# Patient Record
Sex: Male | Born: 1945
Health system: Southern US, Community
[De-identification: ages and names within clinical notes are randomized; demographics above are authoritative.]

## PROBLEM LIST (undated history)

## (undated) DIAGNOSIS — K219 Gastro-esophageal reflux disease without esophagitis: Secondary | ICD-10-CM

## (undated) DIAGNOSIS — M545 Low back pain: Secondary | ICD-10-CM

## (undated) DIAGNOSIS — N4340 Spermatocele of epididymis, unspecified: Secondary | ICD-10-CM

## (undated) DIAGNOSIS — C4490 Unspecified malignant neoplasm of skin, unspecified: Secondary | ICD-10-CM

## (undated) DIAGNOSIS — R42 Dizziness and giddiness: Secondary | ICD-10-CM

## (undated) DIAGNOSIS — R079 Chest pain, unspecified: Secondary | ICD-10-CM

## (undated) DIAGNOSIS — R011 Cardiac murmur, unspecified: Secondary | ICD-10-CM

## (undated) DIAGNOSIS — I10 Essential (primary) hypertension: Secondary | ICD-10-CM

## (undated) DIAGNOSIS — M542 Cervicalgia: Secondary | ICD-10-CM

## (undated) DIAGNOSIS — N3010 Interstitial cystitis (chronic) without hematuria: Secondary | ICD-10-CM

## (undated) DIAGNOSIS — M1990 Unspecified osteoarthritis, unspecified site: Secondary | ICD-10-CM

## (undated) DIAGNOSIS — E785 Hyperlipidemia, unspecified: Secondary | ICD-10-CM

## (undated) DIAGNOSIS — M519 Unspecified thoracic, thoracolumbar and lumbosacral intervertebral disc disorder: Secondary | ICD-10-CM

## (undated) DIAGNOSIS — I471 Supraventricular tachycardia: Secondary | ICD-10-CM

## (undated) DIAGNOSIS — K5900 Constipation, unspecified: Secondary | ICD-10-CM

## (undated) DIAGNOSIS — J309 Allergic rhinitis, unspecified: Secondary | ICD-10-CM

## (undated) DIAGNOSIS — N400 Enlarged prostate without lower urinary tract symptoms: Secondary | ICD-10-CM

## (undated) HISTORY — PX: TONSILLECTOMY: SUR1361

## (undated) HISTORY — DX: Chest pain, unspecified: R07.9

## (undated) HISTORY — DX: Constipation, unspecified: K59.00

## (undated) HISTORY — DX: Essential (primary) hypertension: I10

## (undated) HISTORY — PX: APPENDECTOMY: SHX54

## (undated) HISTORY — DX: Allergic rhinitis, unspecified: J30.9

## (undated) HISTORY — DX: Low back pain: M54.5

## (undated) HISTORY — PX: KNEE ARTHROSCOPY: SUR90

## (undated) HISTORY — DX: Unspecified malignant neoplasm of skin, unspecified: C44.90

## (undated) HISTORY — DX: Hyperlipidemia, unspecified: E78.5

## (undated) HISTORY — DX: Interstitial cystitis (chronic) without hematuria: N30.10

## (undated) HISTORY — DX: Unspecified thoracic, thoracolumbar and lumbosacral intervertebral disc disorder: M51.9

## (undated) HISTORY — PX: OTHER SURGICAL HISTORY: SHX169

## (undated) HISTORY — DX: Cervicalgia: M54.2

## (undated) HISTORY — DX: Dizziness and giddiness: R42

## (undated) HISTORY — DX: Supraventricular tachycardia: I47.1

---

## 2003-07-07 ENCOUNTER — Encounter: Admission: RE | Admit: 2003-07-07 | Discharge: 2003-07-07 | Payer: Self-pay | Admitting: Internal Medicine

## 2004-03-19 ENCOUNTER — Ambulatory Visit: Payer: Self-pay | Admitting: Internal Medicine

## 2004-08-06 ENCOUNTER — Ambulatory Visit: Payer: Self-pay | Admitting: Internal Medicine

## 2005-04-01 ENCOUNTER — Ambulatory Visit: Payer: Self-pay | Admitting: Internal Medicine

## 2005-10-07 ENCOUNTER — Ambulatory Visit: Payer: Self-pay | Admitting: Internal Medicine

## 2006-11-25 ENCOUNTER — Ambulatory Visit: Payer: Self-pay | Admitting: Internal Medicine

## 2006-12-06 ENCOUNTER — Ambulatory Visit: Payer: Self-pay | Admitting: Internal Medicine

## 2006-12-06 LAB — CONVERTED CEMR LAB
AST: 22 units/L (ref 0–37)
Albumin: 4 g/dL (ref 3.5–5.2)
Alkaline Phosphatase: 59 units/L (ref 39–117)
Basophils Relative: 0.5 % (ref 0.0–1.0)
Bilirubin Urine: NEGATIVE
CO2: 28 meq/L (ref 19–32)
Chloride: 108 meq/L (ref 96–112)
Cholesterol: 181 mg/dL (ref 0–200)
Creatinine, Ser: 0.9 mg/dL (ref 0.4–1.5)
Eosinophils Absolute: 0.2 10*3/uL (ref 0.0–0.6)
Eosinophils Relative: 3.8 % (ref 0.0–5.0)
HCT: 43.3 % (ref 39.0–52.0)
Leukocytes, UA: NEGATIVE
Lymphocytes Relative: 24.7 % (ref 12.0–46.0)
MCV: 87.5 fL (ref 78.0–100.0)
Monocytes Relative: 7.5 % (ref 3.0–11.0)
Nitrite: NEGATIVE
PSA: 1.45 ng/mL (ref 0.10–4.00)
Platelets: 222 10*3/uL (ref 150–400)
Potassium: 4.3 meq/L (ref 3.5–5.1)
RBC: 4.95 M/uL (ref 4.22–5.81)
RDW: 12.1 % (ref 11.5–14.6)
Specific Gravity, Urine: 1.015 (ref 1.000–1.03)
Total Bilirubin: 0.7 mg/dL (ref 0.3–1.2)
Urine Glucose: NEGATIVE mg/dL
Urobilinogen, UA: 0.2 (ref 0.0–1.0)
VLDL: 22 mg/dL (ref 0–40)

## 2006-12-21 ENCOUNTER — Ambulatory Visit: Payer: Self-pay | Admitting: Gastroenterology

## 2007-01-10 ENCOUNTER — Ambulatory Visit: Payer: Self-pay | Admitting: Gastroenterology

## 2007-05-04 ENCOUNTER — Ambulatory Visit: Payer: Self-pay | Admitting: Internal Medicine

## 2007-05-04 DIAGNOSIS — I471 Supraventricular tachycardia, unspecified: Secondary | ICD-10-CM

## 2007-05-04 DIAGNOSIS — E785 Hyperlipidemia, unspecified: Secondary | ICD-10-CM | POA: Insufficient documentation

## 2007-05-04 DIAGNOSIS — I10 Essential (primary) hypertension: Secondary | ICD-10-CM | POA: Insufficient documentation

## 2007-05-04 DIAGNOSIS — J309 Allergic rhinitis, unspecified: Secondary | ICD-10-CM

## 2007-05-04 DIAGNOSIS — R42 Dizziness and giddiness: Secondary | ICD-10-CM

## 2007-05-04 HISTORY — DX: Supraventricular tachycardia, unspecified: I47.10

## 2007-05-04 HISTORY — DX: Supraventricular tachycardia: I47.1

## 2007-05-04 HISTORY — DX: Allergic rhinitis, unspecified: J30.9

## 2007-05-04 HISTORY — DX: Essential (primary) hypertension: I10

## 2007-05-04 HISTORY — DX: Hyperlipidemia, unspecified: E78.5

## 2007-05-04 HISTORY — DX: Dizziness and giddiness: R42

## 2008-01-11 ENCOUNTER — Ambulatory Visit: Payer: Self-pay | Admitting: Gastroenterology

## 2008-01-25 ENCOUNTER — Ambulatory Visit: Payer: Self-pay | Admitting: Gastroenterology

## 2008-03-04 HISTORY — PX: OTHER SURGICAL HISTORY: SHX169

## 2008-04-10 ENCOUNTER — Ambulatory Visit: Payer: Self-pay | Admitting: Internal Medicine

## 2008-06-26 ENCOUNTER — Ambulatory Visit: Payer: Self-pay | Admitting: Internal Medicine

## 2008-07-03 ENCOUNTER — Encounter: Payer: Self-pay | Admitting: Internal Medicine

## 2008-07-03 ENCOUNTER — Ambulatory Visit: Payer: Self-pay

## 2008-07-04 ENCOUNTER — Telehealth: Payer: Self-pay | Admitting: Internal Medicine

## 2008-07-12 ENCOUNTER — Encounter: Payer: Self-pay | Admitting: Internal Medicine

## 2008-09-04 ENCOUNTER — Encounter: Admission: RE | Admit: 2008-09-04 | Discharge: 2008-09-04 | Payer: Self-pay | Admitting: Orthopedic Surgery

## 2008-09-06 ENCOUNTER — Ambulatory Visit: Payer: Self-pay | Admitting: Internal Medicine

## 2008-09-14 ENCOUNTER — Encounter: Payer: Self-pay | Admitting: Internal Medicine

## 2009-03-08 ENCOUNTER — Ambulatory Visit: Payer: Self-pay | Admitting: Internal Medicine

## 2009-06-06 ENCOUNTER — Ambulatory Visit: Payer: Self-pay | Admitting: Internal Medicine

## 2009-06-07 LAB — CONVERTED CEMR LAB
ALT: 32 units/L (ref 0–53)
AST: 25 units/L (ref 0–37)
Albumin: 4.4 g/dL (ref 3.5–5.2)
Alkaline Phosphatase: 54 units/L (ref 39–117)
Basophils Absolute: 0 10*3/uL (ref 0.0–0.1)
Basophils Relative: 0.6 % (ref 0.0–3.0)
Bilirubin Urine: NEGATIVE
Bilirubin, Direct: 0.1 mg/dL (ref 0.0–0.3)
CO2: 30 meq/L (ref 19–32)
Creatinine, Ser: 0.9 mg/dL (ref 0.4–1.5)
Eosinophils Absolute: 0.4 10*3/uL (ref 0.0–0.7)
Folate: >20 ng/mL
GFR calc non Af Amer: 90 mL/min (ref >60)
HCT: 42.8 % (ref 39.0–52.0)
Hemoglobin, Urine: NEGATIVE
Hemoglobin: 14.4 g/dL (ref 13.0–17.0)
MCV: 89.8 fL (ref 78.0–100.0)
Neutrophils Relative %: 49.8 % (ref 43.0–77.0)
Nitrite: NEGATIVE
Platelets: 167 10*3/uL (ref 150.0–400.0)
RBC: 4.76 M/uL (ref 4.22–5.81)
RDW: 11.9 % (ref 11.5–14.6)
Sodium: 141 meq/L (ref 135–145)
Specific Gravity, Urine: 1.01 (ref 1.000–1.030)
Total CHOL/HDL Ratio: 3
Total Protein, Urine: NEGATIVE mg/dL
Total Protein: 7.8 g/dL (ref 6.0–8.3)
Transferrin: 278 mg/dL (ref 212.0–360.0)
Triglycerides: 156 mg/dL — ABNORMAL HIGH (ref 0.0–149.0)
VLDL: 31.2 mg/dL (ref 0.0–40.0)
WBC: 6.4 10*3/uL (ref 4.5–10.5)
pH: 7 (ref 5.0–8.0)

## 2009-07-15 ENCOUNTER — Ambulatory Visit: Payer: Self-pay | Admitting: Internal Medicine

## 2009-07-15 ENCOUNTER — Encounter: Payer: Self-pay | Admitting: Internal Medicine

## 2009-07-15 DIAGNOSIS — R079 Chest pain, unspecified: Secondary | ICD-10-CM

## 2009-07-15 DIAGNOSIS — M542 Cervicalgia: Secondary | ICD-10-CM

## 2009-07-15 HISTORY — DX: Chest pain, unspecified: R07.9

## 2009-07-15 HISTORY — DX: Cervicalgia: M54.2

## 2009-07-17 ENCOUNTER — Telehealth: Payer: Self-pay | Admitting: Internal Medicine

## 2010-02-17 ENCOUNTER — Ambulatory Visit: Payer: Self-pay | Admitting: Internal Medicine

## 2010-02-17 DIAGNOSIS — M545 Low back pain, unspecified: Secondary | ICD-10-CM

## 2010-02-17 DIAGNOSIS — K5900 Constipation, unspecified: Secondary | ICD-10-CM

## 2010-02-17 HISTORY — DX: Constipation, unspecified: K59.00

## 2010-02-17 HISTORY — DX: Low back pain, unspecified: M54.50

## 2010-06-03 NOTE — Progress Notes (Signed)
Summary: RX/results  Phone Note Call from Patient Call back at Home Phone [phone]   Summary of Call: Patient left message on triage that he was supposed to receive prescription for a pain pill and a muscle relaxer, when patient went to his pharmacy they had not rec'd prescription for pain med. Patient wanted to double check this and he also would like test results. Please advise. Initial call taken by: Lucious Groves,  July 17, 2009 3:40 PM  Follow-up for Phone Call        patient is referred back to his instructions on the tylenol and advil for pain;  the robaxin for muscle relaxer was sent to the pharmacy but this is not normally accepted escript;  will re-do -   done hardcopy to LIM side B - dahlia  Follow-up by: Corwin Levins MD,  July 17, 2009 5:44 PM  Additional Follow-up for Phone Call Additional follow up Details #1::        I called pt's pharmacy and was advised by Nedra Hai that RX for Robaxin was picked up by pt 03/14. I called pt back to advise of above, left message on machine for pt to call office. RX re-print discarded. Additional Follow-up by: Margaret Pyle, CMA,  July 18, 2009 10:20 AM    Prescriptions: ROBAXIN 500 MG TABS (METHOCARBAMOL) 1-2 by mouth QID as needed for muscle spasm  #50 x 1   Entered and Authorized by:   Corwin Levins MD   Signed by:   Corwin Levins MD on 07/17/2009   Method used:   Print then Give to Patient   RxID:   [id number]   Appended Document: RX/results pt called back and I explained above. pt understood and stated that it was a misunderstanding. pt als stated that he will f/u with ortho.
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
## 2010-06-03 NOTE — Assessment & Plan Note (Signed)
Summary: FU ON MEDS/ REFILLS/ FLU SHOT /NWS   Vital Signs:  Patient profile:   65 year old male Height:      74.5 inches Weight:      225.38 pounds BMI:     28.65 O2 Sat:      95 % on Room air Temp:     97.9 degrees F oral Pulse rate:   75 / minute BP sitting:   130 / 82  (left arm) Cuff size:   large  Vitals Entered By: Zella Ball Ewing CMA Duncan Dull) (February 17, 2010 1:13 PM)  O2 Flow:  Room air CC: followup on medications, flu shot/RE   Primary Care Provider:  Corwin Levins MD  CC:  followup on medications and flu shot/RE.  History of Present Illness: here for f/u - neck pain has resolved;  has ongoing chronic recurrent lower back pain, asks for muscle relaxer refill;  "if it wasnt for my teeth and my back , I would be great."  Had a lab screening at [facility] - BP 161/98; glc 98, tot chol 229, BMI 28.8;  could not afford lipitor in the past but did take some of sister in laws lipitor in feb with most recent labs; unfort no lipitor since feb - dose at that time was 20 mg;  wants to start the lipitor next mo with new rx;  Pt denies CP, worsening sob, doe, wheezing, orthopnea, pnd, worsening LE edema, palps, dizziness or syncope .  Also had cortisone to right elbow and volatern gel that has worked well in the past few wks.  Due for flu shot today.  Pt denies new neuro symptoms such as headache, facial or extremity weakness No fever, wt loss, night sweats, loss of appetite or other constitutional symptoms   Problems Prior to Update: 1)  Constipation  (ICD-564.00) 2)  Back Pain, Lumbar  (ICD-724.2) 3)  Chest Pain  (ICD-786.50) 4)  Neck Pain, Acute  (ICD-723.1) 5)  Special Screening Malig Neoplasms Other Sites  (ICD-V76.49) 6)  Preventive Health Care  (ICD-V70.0) 7)  Preventive Health Care  (ICD-V70.0) 8)  Psvt  (ICD-427.0) 9)  Vertigo  (ICD-780.4) 10)  Allergic Rhinitis  (ICD-477.9) 11)  Hypertension  (ICD-401.9) 12)  Hyperlipidemia  (ICD-272.4)  Medications Prior to Update: 1)   Atenolol 50 Mg Tabs (Atenolol) .Marland Kitchen.. 1po Once Daily 2)  Ecotrin Low Strength 81 Mg  Tbec (Aspirin) .Marland Kitchen.. 1 Poi Qd 3)  Antibiotic Per Dental For 4 Wks 4)  Meclizine Hcl 12.5 Mg Tabs (Meclizine Hcl) .Marland Kitchen.. 1 - 2 By Mouth Q 6 Hrs As Needed Dizziness 5)  Diazepam 5 Mg Tabs (Diazepam) .Marland Kitchen.. 1 By Mouth Two Times A Day As Needed Dizziness 6)  Robaxin 500 Mg Tabs (Methocarbamol) .Marland Kitchen.. 1-2 By Mouth Qid As Needed For Muscle Spasm  Current Medications (verified): 1)  Atenolol 50 Mg Tabs (Atenolol) .Marland Kitchen.. 1po Once Daily 2)  Ecotrin Low Strength 81 Mg  Tbec (Aspirin) .Marland Kitchen.. 1 Poi Qd 3)  Robaxin 500 Mg Tabs (Methocarbamol) .Marland Kitchen.. 1-2 By Mouth Qid As Needed For Muscle Spasm 4)  Voltaren 1 % Gel (Diclofenac Sodium) .... Use Asd 5)  Stool Softner .Marland KitchenMarland KitchenMarland Kitchen 1po Two Times A Day 6)  Miralax  Powd (Polyethylene Glycol 3350) .Marland Kitchen.. 1 Scoop in 8 Oz Water Per Day 7)  Tramadol Hcl 50 Mg Tabs (Tramadol Hcl) .Marland Kitchen.. 1 - 2 By Mouth Q 6 Hrs 8)  Lipitor 20 Mg Tabs (Atorvastatin Calcium) .Marland Kitchen.. 1po Once Daily  Allergies (verified): 1)  !  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   Head:  normocephalic, atraumatic, no abnormalities observed, and no abnormalities palpated.   Eyes:  vision grossly intact, pupils equal, pupils round, and pupils reactive to light.   Ears:  R ear normal and L ear normal.   Nose:  no external deformity and no nasal discharge.   Mouth:  Oral mucosa and oropharynx without lesions or exudates. Neck:  supple, full ROM, no masses, no thyromegaly, no thyroid nodules or tenderness, no JVD, normal carotid upstroke, and no cervical lymphadenopathy.   Lungs:  normal respiratory effort, no intercostal retractions, no accessory muscle use, normal breath sounds, no dullness, no fremitus, no crackles, and no wheezes.   Heart:  normal rate, regular rhythm, no murmur, no gallop, no rub, no JVD Abdomen:  soft, non-tender, normal bowel sounds, no distention, no masses, no guarding, no rigidity, no rebound tenderness, Msk:  normal ROM, no joint tenderness, no joint swelling, no joint warmth, no redness over joints; no spine tenderness Extremities:  no edema, no erythema  Neurologic:  No cranial nerve deficits noted. Station and gait are normal. Plantar reflexes are down-going bilaterally. DTRs are symmetrical throughout. Sensory, motor and coordinative functions appear intact. Skin:  color normal and no rashes.   Psych:  not depressed appearing and slightly anxious.     Impression & Recommendations:  Problem # 1:  HYPERTENSION (ICD-401.9)  His updated medication list for this problem includes:    Atenolol 50 Mg Tabs (Atenolol) .Marland Kitchen... 1po once daily  BP today: 130/82 Prior BP: 146/82 (07/15/2009)  Labs Reviewed: K+: 4.3 (06/06/2009) Creat: : 0.9 (06/06/2009)   Chol: 164 (06/06/2009)   HDL: 56.50 (06/06/2009)   LDL: 76 (06/06/2009)   TG: 156.0 (06/06/2009) stable overall by hx and exam, ok to continue meds/tx as is   Problem # 2:   BACK PAIN, LUMBAR (ICD-724.2)  His updated medication list for this problem includes:    Ecotrin Low Strength 81 Mg Tbec (Aspirin) .Marland Kitchen... 1 poi qd    Robaxin 500 Mg Tabs (Methocarbamol) .Marland Kitchen... 1-2 by mouth qid as needed for muscle spasm    Tramadol Hcl 50 Mg Tabs (Tramadol hcl) .Marland Kitchen... 1 - 2 by mouth q 6 hrs chronic, recurrent no change - treat as above, f/u any worsening signs or symptoms   Problem # 3:  NECK PAIN, ACUTE (ICD-723.1)  His updated medication list for this problem includes:    Ecotrin Low Strength 81 Mg Tbec (Aspirin) .Marland Kitchen... 1 poi qd    Robaxin 500 Mg Tabs (Methocarbamol) .Marland Kitchen... 1-2 by mouth qid as needed for muscle spasm  Tramadol Hcl 50 Mg Tabs (Tramadol hcl) .Marland Kitchen... 1 - 2 by mouth q 6 hrs resolved, f/u any worsneing s/s  Problem # 4:  HYPERLIPIDEMIA (ICD-272.4)  Labs Reviewed: SGOT: 25 (06/06/2009)   SGPT: 32 (06/06/2009)   HDL:56.50 (06/06/2009), 54.7 (12/06/2006)  LDL:76 (06/06/2009), 104 (16/02/9603)  Chol:164 (06/06/2009), 181 (12/06/2006)  Trig:156.0 (06/06/2009), 110 (12/06/2006) to re-start the lipitor 20 mg; Pt to continue diet efforts, good med tolerance previously ; to check labs next visit - goal LDL less than 100  His updated medication list for this problem includes:    Lipitor 20 Mg Tabs (Atorvastatin calcium) .Marland Kitchen... 1po once daily  Problem # 5:  CONSTIPATION (ICD-564.00)  His updated medication list for this problem includes:    Miralax Powd (Polyethylene glycol 3350) .Marland Kitchen... 1 scoop in 8 oz water per day treat as above, f/u any worsening signs or symptoms   Complete Medication List: 1)  Atenolol 50 Mg Tabs (Atenolol) .Marland Kitchen.. 1po once daily 2)  Ecotrin Low Strength 81 Mg Tbec (Aspirin) .Marland Kitchen.. 1 poi qd 3)  Robaxin 500 Mg Tabs (Methocarbamol) .Marland Kitchen.. 1-2 by mouth qid as needed for muscle spasm 4)  Voltaren 1 % Gel (Diclofenac sodium) .... Use asd 5)  Stool Softner  .Marland KitchenMarland KitchenMarland Kitchen 1po two times a day 6)  Miralax Powd (Polyethylene glycol 3350) .Marland Kitchen.. 1 scoop in 8 oz water per  day 7)  Tramadol Hcl 50 Mg Tabs (Tramadol hcl) .Marland Kitchen.. 1 - 2 by mouth q 6 hrs 8)  Lipitor 20 Mg Tabs (Atorvastatin calcium) .Marland Kitchen.. 1po once daily  Other Orders: Flu Vaccine 83yrs + MEDICARE PATIENTS (V4098) Administration Flu vaccine - MCR (J1914)  Patient Instructions: 1)  you had the flu shot today 2)  please try the OTC miralax daily 3)  start the lipitor 20 mg per day 4)  Please take all new medications as prescribed - the pain med, and muscle relaxer 5)  Continue all previous medications as before this visit 6)  Please schedule a follow-up appointment in 4 months - feb  2012 with CPX labs Prescriptions: LIPITOR 20 MG TABS (ATORVASTATIN CALCIUM) 1po once daily  #90 x 3   Entered and Authorized by:   Corwin Levins MD   Signed by:   Corwin Levins MD on 02/17/2010   Method used:   Print then Give to Patient   RxID:   [id number] TRAMADOL HCL 50 MG TABS (TRAMADOL HCL) 1 - 2 by mouth q 6 hrs  #120 x 5   Entered and Authorized by:   Corwin Levins MD   Signed by:   Corwin Levins MD on 02/17/2010   Method used:   Print then Give to Patient   RxID:   [id number] ROBAXIN 500 MG TABS (METHOCARBAMOL) 1-2 by mouth QID as needed for muscle spasm  #100 x 2   Entered and Authorized by:   Corwin Levins MD   Signed by:   Corwin Levins MD on 02/17/2010   Method used:   Print then Give to Patient   RxID:   [id number]    Orders Added: 1)  Flu Vaccine 16yrs + MEDICARE PATIENTS [Q2039] 2)  Administration Flu vaccine - MCR [G0008] 3)  Est. Patient Level IV [59563]   Flu Vaccine Consent Questions     Do you have a history of severe allergic reactions to this vaccine? no    Any prior history of allergic reactions to egg and/or gelatin? no    Do you have a  sensitivity to the preservative Thimersol? no    Do you have a past history of Guillan-Barre Syndrome? no    Do you currently have an acute febrile illness? no    Have you ever had a severe reaction to latex? no    Vaccine information  given and explained to patient? yes    Are you currently pregnant? no    Lot Number:[id number]   Exp Date:11/01/2010   Site Given  Left Deltoid [id number]

## 2010-06-03 NOTE — Assessment & Plan Note (Signed)
Summary: VERTIGO X 4 DYS--STC   Vital Signs:  Patient profile:   65 year old male Height:      73.5 inches Weight:      226 pounds BMI:     29.52 O2 Sat:      98 % on Room air Temp:     97.5 degrees F oral Pulse rate:   61 / minute BP sitting:   130 / 84  (left arm) Cuff size:   large  Vitals Entered ByZella Ball Ewing (June 06, 2009 2:18 PM)  O2 Flow:  Room air  CC: vertigo, cholesterol prescription/RE   CC:  vertigo and cholesterol prescription/RE.  History of Present Illness: here with incidental episode of vertigo; saw dr love 21 yrs ago and dx with vertigo , sounds like BPV, lasted initially 8 months and better with valium; had several minor episodes since then;  has nocturia x 5 most night after drinking too much fluids per pt;  has about 15 sec each time after he gets up form bed, also to bend quickly such as ducking under the garage door;  no actual fall, palps, or syncope;  Pt denies CP, sob, doe, wheezing, orthopnea, pnd, worsening LE edema, palps, or syncope  Pt denies other new neuro symptoms such as  facial or extremity weakness .  Has been recently on a decongestant as he has chronic persistent sinus congestion, no worse recently per pt, no fever, pain or ST or cough.  Sinus better with the mucinex D. Has a dull headache today, but not throbbing, and minor per pt and woulc not worry at all or  be concerned except for the dizziness.     Here for wellness Diet: Heart Healthy or DM if diabetic Physical Activities: Sedentary Depression/mood screen: Negative Hearing: Intact bilateral Visual Acuity: Grossly normal ADL's: Capable  Fall Risk: None Home Safety: Good End-of-Life Planning: Advance directive - Full code/I agree   Preventive Screening-Counseling & Management      Drug Use:  no.    Problems Prior to Update: 1)  Special Screening Malig Neoplasms Other Sites  (ICD-V76.49) 2)  Fatigue  (ICD-780.79) 3)  Uri  (ICD-465.9) 4)  Preventive Health Care   (ICD-V70.0) 5)  Asthmatic Bronchitis, Acute  (ICD-466.0) 6)  Postural Lightheadedness  (ICD-780.4) 7)  Cervicalgia  (ICD-723.1) 8)  Cervicalgia  (ICD-723.1) 9)  Preventive Health Care  (ICD-V70.0) 10)  Psvt  (ICD-427.0) 11)  Vertigo  (ICD-780.4) 12)  Allergic Rhinitis  (ICD-477.9) 13)  Hypertension  (ICD-401.9) 14)  Hyperlipidemia  (ICD-272.4)  Medications Prior to Update: 1)  Atenolol 50 Mg Tabs (Atenolol) .Marland Kitchen.. 1po Once Daily 2)  Ecotrin Low Strength 81 Mg  Tbec (Aspirin) .Marland Kitchen.. 1 Poi Qd 3)  Hydrocodone-Acetaminophen 5-325 Mg Tabs (Hydrocodone-Acetaminophen) .Marland Kitchen.. 1 By Mouth Q 6 Hrs As Needed 4)  Avelox 400 Mg Tabs (Moxifloxacin Hcl) .Marland Kitchen.. 1 By Mouth Once Daily For 7 Days 5)  Tussionex Pennkinetic Er 8-10 Mg/50ml Lqcr (Chlorpheniramine-Hydrocodone) .Marland Kitchen.. 1 Tsp By Mouth Two Times A Day As Needed 6)  Prednisone 10 Mg Tabs (Prednisone) .... 3po Qd For 3days, Then 2po Qd For 3days, Then 1po Qd For 3days, Then Stop  Current Medications (verified): 1)  Atenolol 50 Mg Tabs (Atenolol) .Marland Kitchen.. 1po Once Daily 2)  Ecotrin Low Strength 81 Mg  Tbec (Aspirin) .Marland Kitchen.. 1 Poi Qd 3)  Antibiotic Per Dental For 4 Wks 4)  Meclizine Hcl 12.5 Mg Tabs (Meclizine Hcl) .Marland Kitchen.. 1 - 2 By Mouth Q 6 Hrs As  Needed Dizziness 5)  Diazepam 5 Mg Tabs (Diazepam) .Marland Kitchen.. 1 By Mouth Two Times A Day As Needed Dizziness  Allergies (verified): 1)  ! Flexeril  Past History:  Past Medical History: Last updated: 05/04/2007 Hyperlipidemia Hypertension Allergic rhinitis hx of PSVT functional heart murmur  Past Surgical History: Last updated: 04/10/2008 s/p RK surugry bilat Appendectomy Tonsillectomy s/p dental implant left upper jaw 11/09  Family History: Last updated: 06/06/2009 mother with breast cancer, dementia HTN Family History High cholesterol DM heart disease sister with renal transplant daughter with chronic recurrent rash  Social History: Last updated: 06/06/2009 Never Smoked Alcohol use-no Married 1  daughter  retired after 41 yrs - Personnel officer Drug use-no  Risk Factors: Smoking Status: never (05/04/2007)  Family History: Reviewed history from 05/04/2007 and no changes required. 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         all otherwise negative per pt - 12 system review done  - except for mild fatigue - but no OSA symptoms   Physical Exam  General:  alert and overweight-appearing.   Head:  normocephalic and atraumatic.   Eyes:  vision grossly intact, pupils equal, and pupils round.   Ears:  R ear normal and L ear normal.   Nose:  no external deformity and no nasal discharge.   Mouth:  no gingival abnormalities and pharynx pink and moist.   Neck:  supple and no masses.   Lungs:  normal respiratory effort and normal breath sounds.   Heart:  normal rate and regular rhythm.   Abdomen:  soft, non-tender, and normal bowel sounds.   Msk:  no joint tenderness and no joint swelling.   Extremities:  no edema, no erythema  Neurologic:  alert & oriented X3, cranial nerves II-XII intact, strength normal in all extremities, sensation intact to light touch, DTRs symmetrical and normal, and finger-to-nose normal.     Impression &  Recommendations:  Problem # 1:  Preventive Health Care (ICD-V70.0)  Overall doing well, age appropriate education and counseling updated and referral for appropriate preventive services done unless declined, immunizations up to date or declined, diet counseling done if overweight, urged to quit smoking if smokes , most recent labs reviewed and current ordered if appropriate, ecg reviewed or declined (interpretation per ECG scanned in the EMR if done); information regarding Medicare Prevention requirements given if appropriate  Orders: Prescription Created Electronically [phone])  Problem # 2:  VERTIGO (ICD-780.4)  His updated medication list for this problem includes:    Meclizine Hcl 12.5 Mg Tabs (Meclizine hcl) .Marland Kitchen... 1 - 2 by mouth q 6 hrs as needed dizziness treat as above, f/u any worsening signs or symptoms , as valium as needed , most c/w on exam with BPV  Problem # 3:  PSVT (ICD-427.0)  His updated medication list for this problem includes:    Atenolol 50 Mg Tabs (Atenolol) .Marland Kitchen... 1po once daily  Ecotrin Low Strength 81 Mg Tbec (Aspirin) .Marland Kitchen... 1 poi qd stable overall by hx and exam, ok to continue meds/tx as is   Problem # 4:  HYPERTENSION (ICD-401.9)  His updated medication list for this problem includes:    Atenolol 50 Mg Tabs (Atenolol) .Marland Kitchen... 1po once daily .  BP today: 130/84 Prior BP: 142/84 (09/06/2008)  Labs Reviewed: K+: 4.3 (12/06/2006) Creat: : 0.9 (12/06/2006)   Chol: 181 (12/06/2006)   HDL: 54.7 (12/06/2006)   LDL: 104 (12/06/2006)   TG: 110 (12/06/2006) stable overall by hx and exam, ok to continue meds/tx as is   Problem # 5:  FATIGUE (ICD-780.79) exam benign, to check labs below; follow with expectant management  Orders: T-Vitamin D (25-Hydroxy) (60454-09811) TLB-BMP (Basic Metabolic Panel-BMET) (80048-METABOL) TLB-CBC Platelet - w/Differential (85025-CBCD) TLB-Hepatic/Liver Function Pnl (80076-HEPATIC) TLB-TSH (Thyroid Stimulating Hormone)  (84443-TSH) TLB-Sedimentation Rate (ESR) (85652-ESR) TLB-IBC Pnl (Iron/FE;Transferrin) (83550-IBC) TLB-B12 + Folate Pnl (82746_82607-B12/FOL) TLB-Udip ONLY (81003-UDIP)  Problem # 6:  HYPERLIPIDEMIA (ICD-272.4)  to check lipids - consider crestor, cont diet  Orders: TLB-Lipid Panel (80061-LIPID)  Complete Medication List: 1)  Atenolol 50 Mg Tabs (Atenolol) .Marland Kitchen.. 1po once daily 2)  Ecotrin Low Strength 81 Mg Tbec (Aspirin) .Marland Kitchen.. 1 poi qd 3)  Antibiotic Per Dental For 4 Wks  4)  Meclizine Hcl 12.5 Mg Tabs (Meclizine hcl) .Marland Kitchen.. 1 - 2 by mouth q 6 hrs as needed dizziness 5)  Diazepam 5 Mg Tabs (Diazepam) .Marland Kitchen.. 1 by mouth two times a day as needed dizziness  Other Orders: TLB-PSA (Prostate Specific Antigen) (84153-PSA)  Patient Instructions: 1)  Please take all new medications as prescribed 2)  Continue all previous medications as before this visit  3)  Please go to the Lab in the basement for your blood and/or urine tests today  4)  Please schedule a follow-up appointment in 1 year or sooner if needed Prescriptions: DIAZEPAM 5 MG TABS (DIAZEPAM) 1 by mouth two times a day as needed dizziness  #60 x 0   Entered and Authorized by:   Corwin Levins MD   Signed by:   Corwin Levins MD on 06/06/2009   Method used:   Print then Give to Patient   RxID:   [id number] MECLIZINE HCL 12.5 MG TABS (MECLIZINE HCL) 1 - 2 by mouth q 6 hrs as needed dizziness  #60 x 1   Entered and Authorized by:   Corwin Levins MD   Signed by:   Corwin Levins MD on 06/06/2009   Method used:   Print then Give to Patient   RxID:   [id number] ATENOLOL 50 MG TABS (ATENOLOL) 1po once daily  #90 x 3   Entered and Authorized by:   Corwin Levins MD   Signed by:   Corwin Levins MD on 06/06/2009   Method used:   Print then Give to Patient   RxID:   [id number]

## 2010-06-03 NOTE — Miscellaneous (Signed)
Summary: [facility]  [facility]   Imported By: Lester [place] 07/18/2009 08:05:03  _____________________________________________________________________  External Attachment:    Type:   Image     Comment:   External Document

## 2010-10-22 ENCOUNTER — Ambulatory Visit (INDEPENDENT_AMBULATORY_CARE_PROVIDER_SITE_OTHER)
Admission: RE | Admit: 2010-10-22 | Discharge: 2010-10-22 | Disposition: A | Discharge status: Home / Self Care | Payer: Medicare Other | Source: Ambulatory Visit | Attending: Internal Medicine | Admitting: Internal Medicine

## 2010-10-22 ENCOUNTER — Encounter: Payer: Self-pay | Admitting: Internal Medicine

## 2010-10-22 ENCOUNTER — Other Ambulatory Visit (INDEPENDENT_AMBULATORY_CARE_PROVIDER_SITE_OTHER): Payer: Medicare Other

## 2010-10-22 ENCOUNTER — Other Ambulatory Visit: Payer: Self-pay | Admitting: Internal Medicine

## 2010-10-22 ENCOUNTER — Ambulatory Visit (INDEPENDENT_AMBULATORY_CARE_PROVIDER_SITE_OTHER): Payer: Medicare Other | Admitting: Internal Medicine

## 2010-10-22 VITALS — BP 132/82 | HR 68 | Temp 98.3°F | Ht 74.0 in | Wt 191.2 lb

## 2010-10-22 DIAGNOSIS — J309 Allergic rhinitis, unspecified: Secondary | ICD-10-CM

## 2010-10-22 DIAGNOSIS — E785 Hyperlipidemia, unspecified: Secondary | ICD-10-CM

## 2010-10-22 DIAGNOSIS — R634 Abnormal weight loss: Secondary | ICD-10-CM

## 2010-10-22 DIAGNOSIS — N320 Bladder-neck obstruction: Secondary | ICD-10-CM

## 2010-10-22 DIAGNOSIS — Z0000 Encounter for general adult medical examination without abnormal findings: Secondary | ICD-10-CM | POA: Insufficient documentation

## 2010-10-22 DIAGNOSIS — I10 Essential (primary) hypertension: Secondary | ICD-10-CM

## 2010-10-22 DIAGNOSIS — M47812 Spondylosis without myelopathy or radiculopathy, cervical region: Secondary | ICD-10-CM | POA: Insufficient documentation

## 2010-10-22 LAB — CBC WITH DIFFERENTIAL/PLATELET
Eosinophils Absolute: 0.3 10*3/uL (ref 0.0–0.7)
HCT: 42.9 % (ref 39.0–52.0)
Hemoglobin: 15 g/dL (ref 13.0–17.0)
Lymphs Abs: 2.1 10*3/uL (ref 0.7–4.0)
MCV: 90 fl (ref 78.0–100.0)
Monocytes Relative: 7.8 % (ref 3.0–12.0)
Neutro Abs: 3.3 10*3/uL (ref 1.4–7.7)
Platelets: 202 10*3/uL (ref 150.0–400.0)
WBC: 6.2 10*3/uL (ref 4.5–10.5)

## 2010-10-22 LAB — BASIC METABOLIC PANEL
BUN: 15 mg/dL (ref 6–23)
CO2: 29 mEq/L (ref 19–32)
Chloride: 104 mEq/L (ref 96–112)
Glucose, Bld: 89 mg/dL (ref 70–99)
Sodium: 140 mEq/L (ref 135–145)

## 2010-10-22 LAB — HEPATIC FUNCTION PANEL
ALT: 28 U/L (ref 0–53)
AST: 21 U/L (ref 0–37)
Bilirubin, Direct: 0 mg/dL (ref 0.0–0.3)
Total Protein: 6.7 g/dL (ref 6.0–8.3)

## 2010-10-22 LAB — LIPID PANEL
HDL: 68.3 mg/dL (ref >39.00)
Triglycerides: 215 mg/dL — ABNORMAL HIGH (ref 0.0–149.0)

## 2010-10-22 LAB — URINALYSIS, ROUTINE W REFLEX MICROSCOPIC
Hgb urine dipstick: NEGATIVE
Nitrite: NEGATIVE
Urobilinogen, UA: 0.2 (ref 0.0–1.0)

## 2010-10-22 LAB — PSA: PSA: 1.53 ng/mL (ref 0.10–4.00)

## 2010-10-22 LAB — LDL CHOLESTEROL, DIRECT: Direct LDL: 188.4 mg/dL

## 2010-10-22 LAB — TSH: TSH: 1.7 u[IU]/mL (ref 0.35–5.50)

## 2010-10-22 NOTE — Progress Notes (Signed)
Subjective:    Patient ID: Troy [name], male    DOB: 04/22/1946, 65 y.o.   MRN: [id number]  HPI Here to f/u; overall doing ok,  Pt denies chest pain, increased sob or doe, wheezing, orthopnea, PND, increased LE swelling, palpitations, dizziness or syncope.  Pt denies new neurological symptoms such as new headache, or facial or extremity weakness or numbness   Pt denies polydipsia, polyuria, or low sugar symptoms such as weakness or confusion improved with po intake.  Pt states overall good compliance with meds, trying to follow lower carb "palio" diet, wt overall down 40 lbs , adn with some  exercise however. Overall feels quite well, best in yrs. Overall good compliance with treatment, and good medicine tolerability.  Denies worsening depressive symptoms, suicidal ideation, or panic.  Does have several wks ongoing nasal allergy symptoms with clear congestion, itch and sneeze, without fever, pain, ST, cough or wheezing.  Past Medical History  Diagnosis Date  . ALLERGIC RHINITIS 05/04/2007  . BACK PAIN, LUMBAR 02/17/2010  . CHEST PAIN 07/15/2009  . CONSTIPATION 02/17/2010  . HYPERLIPIDEMIA 05/04/2007  . HYPERTENSION 05/04/2007  . NECK PAIN, ACUTE 07/15/2009  . PSVT 05/04/2007  . VERTIGO 05/04/2007   Past Surgical History  Procedure Date  . S/p rk surgury bilat   . Appendectomy   . Tonsillectomy   . S/p dental implant left upper jaw 11/09    reports that he has never smoked. He does not have any smokeless tobacco history on file. He reports that he does not drink alcohol or use illicit drugs. family history includes Cancer in his mother; Dementia in his mother; Diabetes in his other; Heart disease in his other; Hyperlipidemia in his other; and Hypertension in his mother. Allergies  Allergen Reactions  . Cyclobenzaprine Hcl     REACTION: faical skin rash, 'hard cysts'   Current Outpatient Prescriptions on File Prior to Visit  Medication Sig Dispense Refill  . aspirin 81 MG EC tablet  Take 81 mg by mouth daily.        Marland Kitchen atenolol (TENORMIN) 50 MG tablet Take 50 mg by mouth daily.        . traMADol (ULTRAM) 50 MG tablet Take 50 mg by mouth every 6 (six) hours as needed.        Marland Kitchen DISCONTD: atorvastatin (LIPITOR) 20 MG tablet Take 20 mg by mouth daily.        Marland Kitchen DISCONTD: diclofenac sodium (VOLTAREN) 1 % GEL Use as directed       . DISCONTD: Docusate Calcium (STOOL SOFTENER PO) Take by mouth 2 (two) times daily.        Marland Kitchen DISCONTD: methocarbamol (ROBAXIN) 500 MG tablet Take 500 mg by mouth 4 (four) times daily.        Marland Kitchen DISCONTD: polyethylene glycol powder (MIRALAX) powder 1 scoop in 8 oz water per day          Review of Systems Review of Systems  Constitutional: Negative for diaphoresis and unexpected weight change.  HENT: Negative for drooling and tinnitus.   Eyes: Negative for photophobia and visual disturbance.  Respiratory: Negative for choking and stridor.   Gastrointestinal: Negative for vomiting and blood in stool.  Genitourinary: Negative for hematuria and decreased urine volume.  Musculoskeletal: Negative for gait problem.  Skin: Negative for color change and wound.  Neurological: Negative for tremors and numbness.  Psychiatric/Behavioral: Negative for decreased concentration. The patient is not hyperactive.       Objective:   Physical  Exam BP 132/82  Pulse 68  Temp(Src) 98.3 F (36.8 C) (Oral)  Ht 6\' 2"  (1.88 m)  Wt 191 lb 4 oz (86.75 kg)  BMI 24.55 kg/m2  SpO2 96% Physical Exam  VS noted Constitutional: Pt appears well-developed and well-nourished.  HENT: Head: Normocephalic.  Right Ear: External ear normal.  Left Ear: External ear normal.  Eyes: Conjunctivae and EOM are normal. Pupils are equal, round, and reactive to light.  Neck: Normal range of motion. Neck supple.  Cardiovascular: Normal rate and regular rhythm.   Pulmonary/Chest: Effort normal and breath sounds normal.  Abd:  Soft, NT, non-distended, + BS Neurological: Pt is alert. No  cranial nerve deficit.  Skin: Skin is warm. No erythema.  Psychiatric: Pt behavior is normal. Thought content normal.         Assessment & Plan:

## 2010-10-22 NOTE — Patient Instructions (Signed)
Continue all other medications as before Please go to LAB in the Basement for the blood and/or urine tests to be done today Please call the phone number [phone] (the PhoneTree System) for results of testing in 2-3 days;  When calling, simply dial the number, and when prompted enter the MRN number above (the Medical Record Number) and the # key, then the message should start. Please return in 1 year for your yearly visit, or sooner if needed 

## 2010-10-22 NOTE — Assessment & Plan Note (Signed)
stable overall by hx and exam,  and pt to continue medical treatment as before, for allegra otc prn,  to f/u any worsening symptoms or concerns

## 2010-10-22 NOTE — Assessment & Plan Note (Signed)
stable overall by hx and exam, most recent data reviewed with pt, and pt to continue medical treatment as before  BP Readings from Last 3 Encounters:  10/22/10 132/82  02/17/10 130/82  07/15/09 146/82

## 2010-10-22 NOTE — Progress Notes (Signed)
Quick Note:  Voice message left on PhoneTree system - lab is negative, normal or otherwise stable, pt to continue same tx ______ 

## 2010-10-22 NOTE — Assessment & Plan Note (Signed)
Intentional per pt on the "palio diet";  Daughter asked him to come in to make sure he does not have other issue;  Etiology unclear, Exam otherwise benign, to check labs as documented, follow with expectant management

## 2010-10-22 NOTE — Assessment & Plan Note (Signed)
stable overall by hx and exam, most recent data reviewed with pt, and pt to continue medical treatment as before  Lab Results  Component Value Date   LDLCALC 76 06/06/2009

## 2010-11-21 ENCOUNTER — Other Ambulatory Visit: Payer: Self-pay | Admitting: Internal Medicine

## 2011-01-07 ENCOUNTER — Ambulatory Visit (INDEPENDENT_AMBULATORY_CARE_PROVIDER_SITE_OTHER): Payer: Medicare Other | Admitting: *Deleted

## 2011-01-07 ENCOUNTER — Encounter: Payer: Self-pay | Admitting: Internal Medicine

## 2011-01-07 DIAGNOSIS — Z23 Encounter for immunization: Secondary | ICD-10-CM

## 2011-01-27 ENCOUNTER — Ambulatory Visit (INDEPENDENT_AMBULATORY_CARE_PROVIDER_SITE_OTHER): Payer: Medicare Other | Admitting: Internal Medicine

## 2011-01-27 ENCOUNTER — Encounter: Payer: Self-pay | Admitting: Internal Medicine

## 2011-01-27 VITALS — BP 110/80 | HR 67 | Temp 98.1°F | Ht 74.5 in | Wt 186.1 lb

## 2011-01-27 DIAGNOSIS — E785 Hyperlipidemia, unspecified: Secondary | ICD-10-CM

## 2011-01-27 DIAGNOSIS — I10 Essential (primary) hypertension: Secondary | ICD-10-CM

## 2011-01-27 DIAGNOSIS — M545 Low back pain, unspecified: Secondary | ICD-10-CM

## 2011-01-27 DIAGNOSIS — M549 Dorsalgia, unspecified: Secondary | ICD-10-CM | POA: Insufficient documentation

## 2011-01-27 MED ORDER — CYCLOBENZAPRINE HCL 5 MG PO TABS: 5.0000 mg | ORAL_TABLET | Freq: Three times a day (TID) | ORAL | Status: DC | PRN

## 2011-01-27 MED ORDER — PREDNISONE 10 MG PO TABS: 10.0000 mg | ORAL_TABLET | Freq: Every day | ORAL | Status: AC

## 2011-01-27 MED ORDER — METHOCARBAMOL 500 MG PO TABS: 500.0000 mg | ORAL_TABLET | Freq: Four times a day (QID) | ORAL | Status: AC | PRN

## 2011-01-27 MED ORDER — OXYCODONE HCL 5 MG PO TABS: 5.0000 mg | ORAL_TABLET | ORAL | Status: AC | PRN

## 2011-01-27 NOTE — Assessment & Plan Note (Signed)
C/w acute strain, mod to severe  - for pain med, muscle relaxer, and predpack asd ,  to f/u any worsening symptoms or concerns, exam o/w bengn, will hold on imaging at this time

## 2011-01-27 NOTE — Patient Instructions (Signed)
Take all new medications as prescribed Continue all other medications as before Continue your lower fat, lower cholesterol diet Please return in 3 mo with Lab testing done 3-5 days before

## 2011-01-27 NOTE — Assessment & Plan Note (Signed)
Severe last visit with his low carb diet and increase fat/chol - now back to more reasonable diet, actually near vegetarian per  Pt - will ask for lipids in 3 mo, cont current diet

## 2011-01-31 ENCOUNTER — Encounter: Payer: Self-pay | Admitting: Internal Medicine

## 2011-01-31 NOTE — Assessment & Plan Note (Signed)
stable overall by hx and exam, most recent data reviewed with pt, and pt to continue medical treatment as before  BP Readings from Last 3 Encounters:  01/27/11 110/80  10/22/10 132/82  02/17/10 130/82

## 2011-01-31 NOTE — Progress Notes (Signed)
Subjective:    Patient ID: Troy [name], male    DOB: 1946-04-02, 65 y.o.   MRN: [id number]  HPI  Here with acute back pain - lower lumbar, mod to severe x 3 days, without change in severity, bowel or bladder change, fever, wt loss,  worsening LE pain/numbness/weakness, gait change or falls. Started after playing golf - hit 300 golf balls. Pain better to stand, massage and heat.  Had to sleep in recliner yesterday.  No falls.  Has sen ortho recently for medial epicondylitis.  Pt denies chest pain, increased sob or doe, wheezing, orthopnea, PND, increased LE swelling, palpitations, dizziness or syncope.  Pt denies new neurological symptoms such as new headache, or facial or extremity weakness or numbness   Pt denies polydipsia, polyuria, Pt states overall good compliance with meds, trying to follow lower cholesterol diet though admits to some indiscretions recently, wt overall stable but little exercise however.    Past Medical History  Diagnosis Date  . ALLERGIC RHINITIS 05/04/2007  . BACK PAIN, LUMBAR 02/17/2010  . CHEST PAIN 07/15/2009  . CONSTIPATION 02/17/2010  . HYPERLIPIDEMIA 05/04/2007  . HYPERTENSION 05/04/2007  . NECK PAIN, ACUTE 07/15/2009  . PSVT 05/04/2007  . VERTIGO 05/04/2007   Past Surgical History  Procedure Date  . S/p rk surgury bilat   . Appendectomy   . Tonsillectomy   . S/p dental implant left upper jaw 11/09    reports that he has never smoked. He does not have any smokeless tobacco history on file. He reports that he does not drink alcohol or use illicit drugs. family history includes Cancer in his mother; Dementia in his mother; Diabetes in his other; Heart disease in his other; Hyperlipidemia in his other; and Hypertension in his mother. Allergies  Allergen Reactions  . Cyclobenzaprine Hcl     REACTION: faical skin rash, 'hard cysts'   Current Outpatient Prescriptions on File Prior to Visit  Medication Sig Dispense Refill  . aspirin 81 MG EC tablet Take 81  mg by mouth daily.        Marland Kitchen atenolol (TENORMIN) 50 MG tablet TAKE ONE TABLET BY MOUTH EVERY DAY  90 tablet  3  . Coenzyme Q10 (COQ10) 200 MG CAPS Take by mouth daily.        . Omega-3 Fatty Acids (OMEGA 3 PO) Take by mouth daily.        . Probiotic Product (PROBIOTIC PO) Take by mouth daily.        . traMADol (ULTRAM) 50 MG tablet Take 50 mg by mouth every 6 (six) hours as needed.         Review of Systems Review of Systems  Constitutional: Negative for diaphoresis and unexpected weight change.  HENT: Negative for drooling and tinnitus.   Eyes: Negative for photophobia and visual disturbance.  Respiratory: Negative for choking and stridor.   Gastrointestinal: Negative for vomiting and blood in stool.  Genitourinary: Negative for hematuria and decreased urine volume.     Objective:   Physical Exam BP 110/80  Pulse 67  Temp(Src) 98.1 F (36.7 C) (Oral)  Ht 6' 2.5" (1.892 m)  Wt 186 lb 2 oz (84.426 kg)  BMI 23.58 kg/m2  SpO2 97% Physical Exam  VS noted Constitutional: Pt appears well-developed and well-nourished.  HENT: Head: Normocephalic.  Right Ear: External ear normal.  Left Ear: External ear normal.  Eyes: Conjunctivae and EOM are normal. Pupils are equal, round, and reactive to light.  Neck: Normal range of motion. Neck  supple.  Cardiovascular: Normal rate and regular rhythm.   Pulmonary/Chest: Effort normal and breath sounds normal.  Abd:  Soft, NT, non-distended, + BS Neurological: Pt is alert. No cranial nerve deficit. motor/sens/dtr/gait intact Skin: Skin is warm. No erythema.  Psychiatric: Pt behavior is normal. Thought content normal.  Spine with diffuse lower lumbar tender paravertebral only left > right       Assessment & Plan:

## 2011-03-10 ENCOUNTER — Other Ambulatory Visit: Payer: Self-pay | Admitting: Orthopedic Surgery

## 2011-03-10 DIAGNOSIS — N281 Cyst of kidney, acquired: Secondary | ICD-10-CM

## 2011-03-11 ENCOUNTER — Ambulatory Visit
Admission: RE | Admit: 2011-03-11 | Discharge: 2011-03-11 | Disposition: A | Discharge status: Home / Self Care | Payer: Medicare Other | Source: Ambulatory Visit | Attending: Orthopedic Surgery | Admitting: Orthopedic Surgery

## 2011-03-11 DIAGNOSIS — N281 Cyst of kidney, acquired: Secondary | ICD-10-CM

## 2011-03-25 ENCOUNTER — Other Ambulatory Visit: Payer: Self-pay | Admitting: Internal Medicine

## 2011-03-25 ENCOUNTER — Encounter: Payer: Self-pay | Admitting: Internal Medicine

## 2011-03-25 ENCOUNTER — Ambulatory Visit (INDEPENDENT_AMBULATORY_CARE_PROVIDER_SITE_OTHER): Payer: Medicare Other | Admitting: Internal Medicine

## 2011-03-25 ENCOUNTER — Other Ambulatory Visit (INDEPENDENT_AMBULATORY_CARE_PROVIDER_SITE_OTHER): Payer: Medicare Other

## 2011-03-25 VITALS — BP 102/68 | HR 66 | Temp 97.9°F | Ht 74.5 in | Wt 180.4 lb

## 2011-03-25 DIAGNOSIS — M545 Low back pain: Secondary | ICD-10-CM

## 2011-03-25 DIAGNOSIS — Q619 Cystic kidney disease, unspecified: Secondary | ICD-10-CM

## 2011-03-25 DIAGNOSIS — N281 Cyst of kidney, acquired: Secondary | ICD-10-CM | POA: Insufficient documentation

## 2011-03-25 DIAGNOSIS — E785 Hyperlipidemia, unspecified: Secondary | ICD-10-CM

## 2011-03-25 DIAGNOSIS — Z79899 Other long term (current) drug therapy: Secondary | ICD-10-CM | POA: Insufficient documentation

## 2011-03-25 DIAGNOSIS — I10 Essential (primary) hypertension: Secondary | ICD-10-CM

## 2011-03-25 LAB — LIPID PANEL
HDL: 66 mg/dL (ref >39.00)
Total CHOL/HDL Ratio: 4
Triglycerides: 73 mg/dL (ref 0.0–149.0)

## 2011-03-25 LAB — LDL CHOLESTEROL, DIRECT: Direct LDL: 167.6 mg/dL

## 2011-03-25 MED ORDER — CYCLOBENZAPRINE HCL 5 MG PO TABS: 5.0000 mg | ORAL_TABLET | Freq: Three times a day (TID) | ORAL | Status: AC | PRN

## 2011-03-25 MED ORDER — HYDROCODONE-ACETAMINOPHEN 7.5-325 MG PO TABS: 1.0000 | ORAL_TABLET | Freq: Four times a day (QID) | ORAL | Status: AC | PRN

## 2011-03-25 MED ORDER — ATORVASTATIN CALCIUM 20 MG PO TABS: 20.0000 mg | ORAL_TABLET | Freq: Every day | ORAL | Status: DC

## 2011-03-25 NOTE — Patient Instructions (Signed)
Take all new medications as prescribed Continue all other medications as before Please keep your appointments with your specialists as you have planned - Dr Brunilda Payor Please go to LAB in the Basement for the blood and/or urine tests to be done today Please call the phone number [phone] (the PhoneTree System) for results of testing in 2-3 days;  When calling, simply dial the number, and when prompted enter the MRN number above (the Medical Record Number) and the # key, then the message should start.

## 2011-03-25 NOTE — Assessment & Plan Note (Signed)
stable overall by hx and exam, most recent data reviewed with pt, and pt to continue medical treatment as before  Lab Results  Component Value Date   LDLCALC 76 06/06/2009

## 2011-03-25 NOTE — Assessment & Plan Note (Signed)
To f/u urology as planned, d/w pt - may need MRI renal but will have urology determine this

## 2011-03-25 NOTE — Assessment & Plan Note (Signed)
I suspect MSK related - lumbar djd/ddd, and/or muscular spams, exam relatviely benign, ok for limited hydrocodone and flexeril prn, but doubt pain related to renal cyst

## 2011-03-25 NOTE — Progress Notes (Signed)
Subjective:    Patient ID: Troy [name], male    DOB: 1946-02-15, 65 y.o.   MRN: [id number]  HPI   Here with several questions, seen per Dr Darrelyn Hillock, with recent MRI [facility] done since oct 27 visit there (we will ask for results) and was told he had some disc dz but also noted mass to renal and referred to Dr Brunilda Payor, urology for dec 7.  Pt still with ongoing LBP and mult questions.  Pt continues to have recurring LBP without change in severity but recurrent persistent for several months, but no bowel or bladder change, fever, wt loss,  worsening LE pain/numbness/weakness, gait change or falls.Has difficultly sleeping and essentially has to sleep "as still as possbile" b/c any movement in the bed seems to make the pain worse , overall mild to mod.  Oxycodone left over from recent dental procedure is the only thing that makes better.  {Pain overall worse in the AM, better overall to sit more still at work.  Dr Darrelyn Hillock not convinced the pain is compeltely from the lumbar disc and lumbar arthritis.  Predpack per Dr Darrelyn Hillock oct 27 did help as well.  Did not recommend ESI until sees urology.  Played golf yesterday without difficulty.  Also has been exceptionally diligent with lower chol diet and asks for lipid check.  Pt denies chest pain, increased sob or doe, wheezing, orthopnea, PND, increased LE swelling, palpitations, dizziness or syncope.  Pt denies new neurological symptoms such as new headache, or facial or extremity weakness or numbness Past Medical History  Diagnosis Date  . ALLERGIC RHINITIS 05/04/2007  . BACK PAIN, LUMBAR 02/17/2010  . CHEST PAIN 07/15/2009  . CONSTIPATION 02/17/2010  . HYPERLIPIDEMIA 05/04/2007  . HYPERTENSION 05/04/2007  . NECK PAIN, ACUTE 07/15/2009  . PSVT 05/04/2007  . VERTIGO 05/04/2007   Past Surgical History  Procedure Date  . S/p rk surgury bilat   . Appendectomy   . Tonsillectomy   . S/p dental implant left upper jaw 11/09    reports that he has never smoked.  He does not have any smokeless tobacco history on file. He reports that he does not drink alcohol or use illicit drugs. family history includes Cancer in his mother; Dementia in his mother; Diabetes in his other; Heart disease in his other; Hyperlipidemia in his other; and Hypertension in his mother. Allergies  Allergen Reactions  . Cyclobenzaprine Hcl     REACTION: faical skin rash, 'hard cysts'   Current Outpatient Prescriptions on File Prior to Visit  Medication Sig Dispense Refill  . aspirin 81 MG EC tablet Take 81 mg by mouth daily.        Marland Kitchen atenolol (TENORMIN) 50 MG tablet TAKE ONE TABLET BY MOUTH EVERY DAY  90 tablet  3  . Coenzyme Q10 (COQ10) 200 MG CAPS Take by mouth daily.        . Omega-3 Fatty Acids (OMEGA 3 PO) Take by mouth daily.        . Probiotic Product (PROBIOTIC PO) Take by mouth daily.        . traMADol (ULTRAM) 50 MG tablet Take 50 mg by mouth every 6 (six) hours as needed.         Review of Systems Review of Systems  Constitutional: Negative for diaphoresis and unexpected weight change.  HENT: Negative for drooling and tinnitus.   Eyes: Negative for photophobia and visual disturbance.  Respiratory: Negative for choking and stridor.   Gastrointestinal: Negative for vomiting and  blood in stool.  Genitourinary: Negative for hematuria and decreased urine volume.  Musculoskeletal: Negative for gait problem.      Objective:   Physical Exam BP 102/68  Pulse 66  Temp(Src) 97.9 F (36.6 C) (Oral)  Ht 6' 2.5" (1.892 m)  Wt 180 lb 6 oz (81.818 kg)  BMI 22.85 kg/m2  SpO2 97% Physical Exam  VS noted Constitutional: Pt appears well-developed and well-nourished.  HENT: Head: Normocephalic.  Right Ear: External ear normal.  Left Ear: External ear normal.  Eyes: Conjunctivae and EOM are normal. Pupils are equal, round, and reactive to light.  Neck: Normal range of motion. Neck supple.  Cardiovascular: Normal rate and regular rhythm.   Pulmonary/Chest: Effort normal  and breath sounds normal.  Abd:  Soft, NT, non-distended, + BS Neurological: Pt is alert. No cranial nerve deficit. motor/sens/dtr intact Spine nontender, has bilat lower lumbar paravertebral tender as well Skin: Skin is warm. No erythema.  Psychiatric: Pt behavior is normal. Thought content normal. 1-2+ nervous    Assessment & Plan:

## 2011-03-25 NOTE — Assessment & Plan Note (Signed)
stable overall by hx and exam, most recent data reviewed with pt, and pt to continue medical treatment as before BP Readings from Last 3 Encounters:  03/25/11 102/68  01/27/11 110/80  10/22/10 132/82
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
## 2011-05-14 DIAGNOSIS — M545 Low back pain: Secondary | ICD-10-CM | POA: Diagnosis not present

## 2011-05-15 ENCOUNTER — Other Ambulatory Visit: Payer: Self-pay | Admitting: Internal Medicine

## 2011-05-15 NOTE — Telephone Encounter (Signed)
Done escript 

## 2011-05-20 DIAGNOSIS — M545 Low back pain: Secondary | ICD-10-CM | POA: Diagnosis not present

## 2011-05-26 DIAGNOSIS — M545 Low back pain: Secondary | ICD-10-CM | POA: Diagnosis not present

## 2011-05-29 DIAGNOSIS — M5137 Other intervertebral disc degeneration, lumbosacral region: Secondary | ICD-10-CM | POA: Diagnosis not present

## 2011-07-17 DIAGNOSIS — J00 Acute nasopharyngitis [common cold]: Secondary | ICD-10-CM | POA: Diagnosis not present

## 2011-07-20 ENCOUNTER — Telehealth: Payer: Self-pay | Admitting: Internal Medicine

## 2011-07-20 NOTE — Telephone Encounter (Signed)
Received 4 pages. Sent to Dr. Jonny Ruiz. SD 07/20/11

## 2011-08-28 ENCOUNTER — Other Ambulatory Visit: Payer: Self-pay | Admitting: Internal Medicine

## 2011-09-22 ENCOUNTER — Ambulatory Visit: Payer: Medicare Other | Admitting: Internal Medicine

## 2011-10-01 ENCOUNTER — Encounter: Payer: Self-pay | Admitting: Internal Medicine

## 2011-10-01 ENCOUNTER — Ambulatory Visit (INDEPENDENT_AMBULATORY_CARE_PROVIDER_SITE_OTHER): Payer: Medicare Other | Admitting: Internal Medicine

## 2011-10-01 ENCOUNTER — Other Ambulatory Visit (INDEPENDENT_AMBULATORY_CARE_PROVIDER_SITE_OTHER): Payer: Medicare Other

## 2011-10-01 VITALS — BP 140/90 | HR 64 | Temp 97.5°F | Ht 74.0 in | Wt 186.2 lb

## 2011-10-01 DIAGNOSIS — E785 Hyperlipidemia, unspecified: Secondary | ICD-10-CM | POA: Diagnosis not present

## 2011-10-01 DIAGNOSIS — I10 Essential (primary) hypertension: Secondary | ICD-10-CM

## 2011-10-01 DIAGNOSIS — J309 Allergic rhinitis, unspecified: Secondary | ICD-10-CM

## 2011-10-01 DIAGNOSIS — N320 Bladder-neck obstruction: Secondary | ICD-10-CM

## 2011-10-01 DIAGNOSIS — M519 Unspecified thoracic, thoracolumbar and lumbosacral intervertebral disc disorder: Secondary | ICD-10-CM | POA: Insufficient documentation

## 2011-10-01 DIAGNOSIS — I861 Scrotal varices: Secondary | ICD-10-CM

## 2011-10-01 HISTORY — DX: Unspecified thoracic, thoracolumbar and lumbosacral intervertebral disc disorder: M51.9

## 2011-10-01 LAB — CBC WITH DIFFERENTIAL/PLATELET
Basophils Relative: 0.5 % (ref 0.0–3.0)
Eosinophils Absolute: 0.3 10*3/uL (ref 0.0–0.7)
HCT: 42.3 % (ref 39.0–52.0)
Hemoglobin: 14.3 g/dL (ref 13.0–17.0)
Lymphs Abs: 1.8 10*3/uL (ref 0.7–4.0)
MCHC: 33.9 g/dL (ref 30.0–36.0)
MCV: 91 fl (ref 78.0–100.0)
Monocytes Absolute: 0.5 10*3/uL (ref 0.1–1.0)
Neutro Abs: 2.9 10*3/uL (ref 1.4–7.7)
RBC: 4.64 Mil/uL (ref 4.22–5.81)
RDW: 12.9 % (ref 11.5–14.6)

## 2011-10-01 LAB — HEPATIC FUNCTION PANEL
Alkaline Phosphatase: 52 U/L (ref 39–117)
Bilirubin, Direct: 0.1 mg/dL (ref 0.0–0.3)
Total Bilirubin: 0.9 mg/dL (ref 0.3–1.2)

## 2011-10-01 LAB — URINALYSIS, ROUTINE W REFLEX MICROSCOPIC
Bilirubin Urine: NEGATIVE
Hgb urine dipstick: NEGATIVE
Ketones, ur: NEGATIVE
Total Protein, Urine: NEGATIVE
Urine Glucose: NEGATIVE

## 2011-10-01 LAB — BASIC METABOLIC PANEL
Calcium: 9.4 mg/dL (ref 8.4–10.5)
Chloride: 107 mEq/L (ref 96–112)
Creatinine, Ser: 0.8 mg/dL (ref 0.4–1.5)
Sodium: 141 mEq/L (ref 135–145)

## 2011-10-01 LAB — LIPID PANEL
HDL: 69.5 mg/dL (ref >39.00)
Total CHOL/HDL Ratio: 3
Triglycerides: 81 mg/dL (ref 0.0–149.0)
VLDL: 16.2 mg/dL (ref 0.0–40.0)

## 2011-10-01 MED ORDER — ATORVASTATIN CALCIUM 40 MG PO TABS: 20.0000 mg | ORAL_TABLET | Freq: Every day | ORAL | Status: DC

## 2011-10-01 MED ORDER — ATORVASTATIN CALCIUM 20 MG PO TABS: 40.0000 mg | ORAL_TABLET | Freq: Every day | ORAL | Status: DC

## 2011-10-01 MED ORDER — FLUTICASONE PROPIONATE 50 MCG/ACT NA SUSP: 2.0000 | Freq: Every day | NASAL | Status: DC

## 2011-10-01 MED ORDER — ATORVASTATIN CALCIUM 40 MG PO TABS: 40.0000 mg | ORAL_TABLET | Freq: Every day | ORAL | Status: DC

## 2011-10-01 MED ORDER — TRAMADOL HCL 50 MG PO TABS: 50.0000 mg | ORAL_TABLET | Freq: Four times a day (QID) | ORAL | Status: DC | PRN

## 2011-10-01 NOTE — Patient Instructions (Signed)
Take all new medications as prescribed - the flonase (sent to pharmacy) Continue all other medications as before, including the tramadol (sent to pharmacy), and lipitor (gave hardcopy) Please go to LAB in the Basement for the blood and/or urine tests to be done today You will be contacted by phone if any changes need to be made immediately.  Otherwise, you will receive a letter about your results with an explanation. You are otherwise up to date with prevention Please keep your appointments with your specialists such as urology as needed Please return in 1 year for your yearly visit, or sooner if needed, with Lab testing done 3-5 days before

## 2011-10-01 NOTE — Progress Notes (Signed)
Subjective:    Patient ID: Troy [name], male    DOB: March 20, 1946, 66 y.o.   MRN: [id number]  HPI  Here to f/u; Does have several wks ongoing nasal allergy symptoms with clear congestion, itch and sneeze, without fever, pain, ST, cough or wheezing. Pt denies chest pain, increased sob or doe, wheezing, orthopnea, PND, increased LE swelling, palpitations, dizziness or syncope.  Pt denies new neurological symptoms such as new headache, or facial or extremity weakness or numbness   Pt denies polydipsia, polyuria,.  Did see urology recently with painful varicocele, 2 cm, tx with ibuprofen, compression underwear, and hot/cold packs;  Usually does well but ibuprofen just doesn't work well at night as the varicoele seems more swollen/more painful at night;  He is trying to avoid surgical intervention.    Also mentions lipitor very expensive even generic with his drug plan - $40/mo, and has only been taking half for the most part.  Was on Paleo diet (higher chol) but no longer doing that.  Otherwise doing overall well, had lost wt with a very stringent diet, but now liberalized a bit, and more exercise, trying to gain a few lbs.  Pt continues to have recurring LBP without change in severity, bowel or bladder change, fever, wt loss,  worsening LE pain/numbness/weakness, gait change or falls.  Had seen ortho for lumbar disc dz related pain, now improved after PT.  Also s/p cortision shot to right medial epicondylar area as it seems to keep hurting and he doesn't want to stop playing golf.   Past Medical History  Diagnosis Date  . ALLERGIC RHINITIS 05/04/2007  . BACK PAIN, LUMBAR 02/17/2010  . CHEST PAIN 07/15/2009  . CONSTIPATION 02/17/2010  . HYPERLIPIDEMIA 05/04/2007  . HYPERTENSION 05/04/2007  . NECK PAIN, ACUTE 07/15/2009  . PSVT 05/04/2007  . VERTIGO 05/04/2007  . Lumbar disc disease 10/01/2011   Past Surgical History  Procedure Date  . S/p rk surgury bilat   . Appendectomy   . Tonsillectomy     . S/p dental implant left upper jaw 11/09    reports that he has never smoked. He does not have any smokeless tobacco history on file. He reports that he does not drink alcohol or use illicit drugs. family history includes Cancer in his mother; Dementia in his mother; Diabetes in his other; Heart disease in his other; Hyperlipidemia in his other; and Hypertension in his mother. Allergies  Allergen Reactions  . Cyclobenzaprine Hcl     REACTION: faical skin rash, 'hard cysts'   Current Outpatient Prescriptions on File Prior to Visit  Medication Sig Dispense Refill  . aspirin 81 MG EC tablet Take 81 mg by mouth daily.        Marland Kitchen atenolol (TENORMIN) 50 MG tablet TAKE ONE TABLET BY MOUTH EVERY DAY  90 tablet  3  . Probiotic Product (PROBIOTIC PO) Take by mouth daily.        . fluticasone (FLONASE) 50 MCG/ACT nasal spray Place 2 sprays into the nose daily.  16 g  2   Review of Systems Review of Systems  Constitutional: Negative for diaphoresis and unexpected weight change.  HENT: Negative for drooling and tinnitus.   Eyes: Negative for photophobia and visual disturbance.  Respiratory: Negative for choking and stridor.   Gastrointestinal: Negative for vomiting and blood in stool.  Genitourinary: Negative for hematuria and decreased urine volume.  Musculoskeletal: Negative for gait problem.  Skin: Negative for color change and wound.  Neurological: Negative for tremors  and numbness.  Psychiatric/Behavioral: Negative for decreased concentration. The patient is not hyperactive.       Objective:   Physical Exam BP 140/90  Pulse 64  Temp(Src) 97.5 F (36.4 C) (Oral)  Ht 6\' 2"  (1.88 m)  Wt 186 lb 4 oz (84.482 kg)  BMI 23.91 kg/m2  SpO2 96% Physical Exam  VS noted, not ill appearing Constitutional: Pt appears well-developed and well-nourished but on thin side  HENT: Head: Normocephalic.  Right Ear: External ear normal.  Left Ear: External ear normal.  Bilat tm's mild erythema.  Sinus  nontender.  Pharynx mild erythema Eyes: Conjunctivae and EOM are normal. Pupils are equal, round, and reactive to light.  Neck: Normal range of motion. Neck supple.  Cardiovascular: Normal rate and regular rhythm.   Pulmonary/Chest: Effort normal and breath sounds normal.  GU:  Tender varicocele noted Neurological: Pt is alert. No cranial nerve deficit.  Skin: Skin is warm. No erythema.  Psychiatric: Pt behavior is normal. Thought content normal.     Assessment & Plan:

## 2011-10-04 ENCOUNTER — Encounter: Payer: Self-pay | Admitting: Internal Medicine

## 2011-10-04 DIAGNOSIS — I861 Scrotal varices: Secondary | ICD-10-CM | POA: Insufficient documentation

## 2011-10-04 NOTE — Assessment & Plan Note (Signed)
stable overall by hx and exam, most recent data reviewed with pt, and pt to continue medical treatment as before Lab Results  Component Value Date   LDLCALC 90 10/01/2011

## 2011-10-04 NOTE — Assessment & Plan Note (Signed)
Ok to add flonase asd,  to f/u any worsening symptoms or concerns

## 2011-10-04 NOTE — Assessment & Plan Note (Signed)
stable overall by hx and exam, most recent data reviewed with pt, and pt to continue medical treatment as before BP Readings from Last 3 Encounters:  10/01/11 140/90  03/25/11 102/68  01/27/11 110/80

## 2011-10-04 NOTE — Assessment & Plan Note (Signed)
With pain, ok for tramadol, consider urology f/u but declines at this time, not sure if wants to go through with the surgury

## 2011-11-16 ENCOUNTER — Other Ambulatory Visit: Payer: Self-pay | Admitting: Internal Medicine

## 2011-11-26 DIAGNOSIS — N509 Disorder of male genital organs, unspecified: Secondary | ICD-10-CM | POA: Diagnosis not present

## 2011-11-26 DIAGNOSIS — N4340 Spermatocele of epididymis, unspecified: Secondary | ICD-10-CM | POA: Diagnosis not present

## 2011-11-26 DIAGNOSIS — N401 Enlarged prostate with lower urinary tract symptoms: Secondary | ICD-10-CM | POA: Diagnosis not present

## 2011-11-27 ENCOUNTER — Other Ambulatory Visit: Payer: Self-pay | Admitting: Urology

## 2011-12-01 ENCOUNTER — Encounter (HOSPITAL_BASED_OUTPATIENT_CLINIC_OR_DEPARTMENT_OTHER): Payer: Self-pay | Admitting: *Deleted

## 2011-12-01 NOTE — Progress Notes (Signed)
Pt instructed npo p mn 8/1 x atenolol, lipitor w sip of water.  To [facility] 8/2 @ 1015.  Needs istat, ekg onarrival.
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
## 2011-12-04 ENCOUNTER — Ambulatory Visit (HOSPITAL_BASED_OUTPATIENT_CLINIC_OR_DEPARTMENT_OTHER): Payer: Medicare Other | Admitting: Anesthesiology

## 2011-12-04 ENCOUNTER — Encounter (HOSPITAL_BASED_OUTPATIENT_CLINIC_OR_DEPARTMENT_OTHER)
Admission: RE | Disposition: A | Discharge status: Home / Self Care | Payer: Self-pay | Source: Ambulatory Visit | Attending: Urology

## 2011-12-04 ENCOUNTER — Other Ambulatory Visit: Payer: Self-pay

## 2011-12-04 ENCOUNTER — Encounter (HOSPITAL_BASED_OUTPATIENT_CLINIC_OR_DEPARTMENT_OTHER): Payer: Self-pay | Admitting: *Deleted

## 2011-12-04 ENCOUNTER — Encounter (HOSPITAL_BASED_OUTPATIENT_CLINIC_OR_DEPARTMENT_OTHER): Payer: Self-pay | Admitting: Anesthesiology

## 2011-12-04 ENCOUNTER — Ambulatory Visit (HOSPITAL_BASED_OUTPATIENT_CLINIC_OR_DEPARTMENT_OTHER)
Admission: RE | Admit: 2011-12-04 | Discharge: 2011-12-04 | Disposition: A | Discharge status: Home / Self Care | Payer: Medicare Other | Source: Ambulatory Visit | Attending: Urology | Admitting: Urology

## 2011-12-04 DIAGNOSIS — N509 Disorder of male genital organs, unspecified: Secondary | ICD-10-CM | POA: Diagnosis not present

## 2011-12-04 DIAGNOSIS — E7800 Pure hypercholesterolemia, unspecified: Secondary | ICD-10-CM | POA: Diagnosis not present

## 2011-12-04 DIAGNOSIS — I499 Cardiac arrhythmia, unspecified: Secondary | ICD-10-CM | POA: Diagnosis not present

## 2011-12-04 DIAGNOSIS — N4340 Spermatocele of epididymis, unspecified: Secondary | ICD-10-CM | POA: Diagnosis not present

## 2011-12-04 DIAGNOSIS — Z79899 Other long term (current) drug therapy: Secondary | ICD-10-CM | POA: Insufficient documentation

## 2011-12-04 DIAGNOSIS — I861 Scrotal varices: Secondary | ICD-10-CM | POA: Insufficient documentation

## 2011-12-04 DIAGNOSIS — N401 Enlarged prostate with lower urinary tract symptoms: Secondary | ICD-10-CM | POA: Diagnosis not present

## 2011-12-04 DIAGNOSIS — Z7982 Long term (current) use of aspirin: Secondary | ICD-10-CM | POA: Diagnosis not present

## 2011-12-04 DIAGNOSIS — I10 Essential (primary) hypertension: Secondary | ICD-10-CM | POA: Insufficient documentation

## 2011-12-04 DIAGNOSIS — R011 Cardiac murmur, unspecified: Secondary | ICD-10-CM | POA: Insufficient documentation

## 2011-12-04 DIAGNOSIS — N508 Other specified disorders of male genital organs: Secondary | ICD-10-CM | POA: Insufficient documentation

## 2011-12-04 HISTORY — DX: Spermatocele of epididymis, unspecified: N43.40

## 2011-12-04 HISTORY — DX: Benign prostatic hyperplasia without lower urinary tract symptoms: N40.0

## 2011-12-04 HISTORY — PX: SPERMATOCELECTOMY: SHX2420

## 2011-12-04 HISTORY — DX: Cardiac murmur, unspecified: R01.1

## 2011-12-04 LAB — POCT I-STAT 4, (NA,K, GLUC, HGB,HCT)
Glucose, Bld: 95 mg/dL (ref 70–99)
HCT: 43 % (ref 39.0–52.0)
Potassium: 4 mEq/L (ref 3.5–5.1)

## 2011-12-04 SURGERY — EXCISION, SPERMATOCELE
Anesthesia: General | Site: Scrotum | Laterality: Left | Wound class: Clean

## 2011-12-04 MED ORDER — LIDOCAINE HCL (CARDIAC) 20 MG/ML IV SOLN
INTRAVENOUS | Status: DC | PRN
  Administered 2011-12-04 (×2): 50 mg via INTRAVENOUS

## 2011-12-04 MED ORDER — LACTATED RINGERS IV SOLN
INTRAVENOUS | Status: DC
  Administered 2011-12-04 (×2): via INTRAVENOUS

## 2011-12-04 MED ORDER — SODIUM CHLORIDE 0.9 % IR SOLN
Status: DC | PRN
  Administered 2011-12-04: 1

## 2011-12-04 MED ORDER — DEXAMETHASONE SODIUM PHOSPHATE 4 MG/ML IJ SOLN
INTRAMUSCULAR | Status: DC | PRN
  Administered 2011-12-04 (×2): 5 mg via INTRAVENOUS

## 2011-12-04 MED ORDER — MEPERIDINE HCL 25 MG/ML IJ SOLN: 6.2500 mg | INTRAMUSCULAR | Status: DC | PRN

## 2011-12-04 MED ORDER — ONDANSETRON HCL 4 MG/2ML IJ SOLN
INTRAMUSCULAR | Status: DC | PRN
  Administered 2011-12-04: 4 mg via INTRAVENOUS

## 2011-12-04 MED ORDER — CEFAZOLIN SODIUM-DEXTROSE 2-3 GM-% IV SOLR
2.0000 g | INTRAVENOUS | Status: AC
  Administered 2011-12-04: 2 g via INTRAVENOUS

## 2011-12-04 MED ORDER — FENTANYL CITRATE 0.05 MG/ML IJ SOLN
25.0000 ug | INTRAMUSCULAR | Status: DC | PRN
  Administered 2011-12-04 (×2): 25 ug via INTRAVENOUS

## 2011-12-04 MED ORDER — BUPIVACAINE HCL (PF) 0.25 % IJ SOLN
INTRAMUSCULAR | Status: DC | PRN
  Administered 2011-12-04: 4.5 mL

## 2011-12-04 MED ORDER — FENTANYL CITRATE 0.05 MG/ML IJ SOLN
INTRAMUSCULAR | Status: DC | PRN
  Administered 2011-12-04 (×4): 50 ug via INTRAVENOUS

## 2011-12-04 MED ORDER — CEPHALEXIN 250 MG PO CAPS: 250.0000 mg | ORAL_CAPSULE | Freq: Four times a day (QID) | ORAL | Status: AC

## 2011-12-04 MED ORDER — HYDROCODONE-ACETAMINOPHEN 5-500 MG PO CAPS: 1.0000 | ORAL_CAPSULE | Freq: Four times a day (QID) | ORAL | Status: AC | PRN

## 2011-12-04 MED ORDER — MIDAZOLAM HCL 5 MG/5ML IJ SOLN
INTRAMUSCULAR | Status: DC | PRN
  Administered 2011-12-04 (×2): 1 mg via INTRAVENOUS

## 2011-12-04 MED ORDER — METOCLOPRAMIDE HCL 5 MG/ML IJ SOLN
INTRAMUSCULAR | Status: DC | PRN
  Administered 2011-12-04 (×2): 5 mg via INTRAVENOUS

## 2011-12-04 MED ORDER — COLLODION LIQD
Status: DC | PRN
  Administered 2011-12-04: 1 via TOPICAL

## 2011-12-04 MED ORDER — PROMETHAZINE HCL 25 MG/ML IJ SOLN: 6.2500 mg | INTRAMUSCULAR | Status: DC | PRN

## 2011-12-04 MED ORDER — LACTATED RINGERS IV SOLN: INTRAVENOUS | Status: DC

## 2011-12-04 MED ORDER — PROPOFOL 10 MG/ML IV EMUL
INTRAVENOUS | Status: DC | PRN
  Administered 2011-12-04: 200 mg via INTRAVENOUS

## 2011-12-04 MED ORDER — HYDROCODONE-ACETAMINOPHEN 5-325 MG PO TABS
1.0000 | ORAL_TABLET | Freq: Four times a day (QID) | ORAL | Status: DC | PRN
  Administered 2011-12-04: 1 via ORAL

## 2011-12-04 MED ORDER — CEFAZOLIN SODIUM 1-5 GM-% IV SOLN: 1.0000 g | INTRAVENOUS | Status: DC

## 2011-12-04 SURGICAL SUPPLY — 42 items
APPLICATOR COTTON TIP 6IN STRL (MISCELLANEOUS) ×2 IMPLANT
BANDAGE GAUZE ELAST BULKY 4 IN (GAUZE/BANDAGES/DRESSINGS) ×4 IMPLANT
BENZOIN TINCTURE PRP APPL 2/3 (GAUZE/BANDAGES/DRESSINGS) IMPLANT
BLADE SURG 15 STRL LF DISP TIS (BLADE) ×1 IMPLANT
BLADE SURG 15 STRL SS (BLADE) ×1
BLADE SURG ROTATE 9660 (MISCELLANEOUS) ×2 IMPLANT
CANISTER SUCTION 1200CC (MISCELLANEOUS) IMPLANT
CANISTER SUCTION 2500CC (MISCELLANEOUS) IMPLANT
CLOTH BEACON ORANGE TIMEOUT ST (SAFETY) ×2 IMPLANT
COVER MAYO STAND STRL (DRAPES) ×2 IMPLANT
COVER TABLE BACK 60X90 (DRAPES) ×2 IMPLANT
DRAIN PENROSE 18X1/4 LTX STRL (WOUND CARE) ×2 IMPLANT
DRAPE PED LAPAROTOMY (DRAPES) ×2 IMPLANT
DRSG TEGADERM 4X4.75 (GAUZE/BANDAGES/DRESSINGS) IMPLANT
ELECT REM PT RETURN 9FT ADLT (ELECTROSURGICAL) ×2
ELECTRODE REM PT RTRN 9FT ADLT (ELECTROSURGICAL) ×1 IMPLANT
GAUZE SPONGE 4X4 12PLY STRL LF (GAUZE/BANDAGES/DRESSINGS) ×2 IMPLANT
GLOVE BIO SURGEON STRL SZ7 (GLOVE) ×2 IMPLANT
GLOVE ECLIPSE 7.0 STRL STRAW (GLOVE) ×4 IMPLANT
GLOVE INDICATOR 7.0 STRL GRN (GLOVE) ×2 IMPLANT
GOWN PREVENTION PLUS LG XLONG (DISPOSABLE) ×2 IMPLANT
NEEDLE HYPO 25X1 1.5 SAFETY (NEEDLE) ×2 IMPLANT
NS IRRIG 500ML POUR BTL (IV SOLUTION) ×2 IMPLANT
PACK BASIN DAY SURGERY FS (CUSTOM PROCEDURE TRAY) ×2 IMPLANT
PENCIL BUTTON HOLSTER BLD 10FT (ELECTRODE) ×2 IMPLANT
STRIP CLOSURE SKIN 1/2X4 (GAUZE/BANDAGES/DRESSINGS) IMPLANT
STRIP CLOSURE SKIN 1/4X4 (GAUZE/BANDAGES/DRESSINGS) IMPLANT
SUPPORT SCROTAL LG STRP (MISCELLANEOUS) ×2 IMPLANT
SUT CHROMIC 3 0 PS 2 (SUTURE) IMPLANT
SUT CHROMIC 3 0 SH 27 (SUTURE) ×2 IMPLANT
SUT MON AB 4-0 PC3 18 (SUTURE) ×2 IMPLANT
SUT SILK 2 0 TIES 17X18 (SUTURE)
SUT SILK 2-0 18XBRD TIE BLK (SUTURE) IMPLANT
SUT SILK 3 0 TIES 17X18 (SUTURE)
SUT SILK 3-0 18XBRD TIE BLK (SUTURE) IMPLANT
SUT VIC AB 0 SH 27 (SUTURE) IMPLANT
SUT VIC AB 3-0 SH 27 (SUTURE)
SUT VIC AB 3-0 SH 27X BRD (SUTURE) IMPLANT
SYR CONTROL 10ML LL (SYRINGE) ×2 IMPLANT
TOWEL OR 17X24 6PK STRL BLUE (TOWEL DISPOSABLE) ×4 IMPLANT
TRAY DSU PREP LF (CUSTOM PROCEDURE TRAY) ×2 IMPLANT
WATER STERILE IRR 500ML POUR (IV SOLUTION) IMPLANT

## 2011-12-04 NOTE — Op Note (Signed)
OCTAVIANO MUKAI is a 66 y.o.   12/04/2011  General  Pre-op diagnosis: Left spermatocele  Postop diagnosis: Same  Procedure done: Left spermatocelectomy, excision of appendix testis  Surgeon: Wendie Simmer. Hermina Barnard  Anesthesia: General  Indication: Patient is a 66 years old male who was seen in the office a week ago with a several months history of moderate left scrotal pain. The pain is worse at night. He has noticed an increasing swelling of the left scrotum. Scrotal ultrasound showed left spermatocele and bilateral epididymal cysts. He would like to have the spermatocele removed. He is scheduled today for procedure  Procedure: The patient was identified by his wrist band and proper timeout was taken.  Under general anesthesia he was prepped and draped and placed in the supine position. The left scrotum was infiltrated with 0.25% Marcaine. A longitudinal incision was made on the left scrotum over the spermatocele. The incision was carried down to the subcutaneous tissues. Hemostasis was secured with electrocautery. The incision was then carried down to the tunica vaginalis which was then incised. The testicle with the spermatocele was then delivered through the wound. The spermatocele was then carefully dissected from the spermatic cord and testicle and excised. The appendix testis was also excised. Hemostasis was then done with electrocautery. The edges of the the incision on the spermatic cord and testicle were then approximated with #3-0 chromic. The wound was then irrigated with normal saline. The testicle and the spermatic cord were then replaced in the scrotum. A small Penrose drain was placed in the wound and brought out through a stab wound incision. The Penrose drain was secured to the skin with #4-0 Monocryl. The subcutaneous tissues were then approximated with #3-0 chromic. And the skin was closed with #4-0 Monocryl. Sterile dressing was then placed on the wound.  Estimated blood loss:  Minimal.  Needle sponge and instrument counts: Correct on 2 counts.  Patient tolerated the procedure well and left the OR in satisfactory condition to postanesthesia care unit.

## 2011-12-04 NOTE — Anesthesia Procedure Notes (Signed)
Procedure Name: LMA Insertion Date/Time: 12/04/2011 11:59 AM Performed by: Norva Pavlov Pre-anesthesia Checklist: Patient identified, Emergency Drugs available, Suction available and Patient being monitored Patient Re-evaluated:Patient Re-evaluated prior to inductionOxygen Delivery Method: Circle System Utilized Preoxygenation: Pre-oxygenation with 100% oxygen Intubation Type: IV induction Ventilation: Mask ventilation without difficulty LMA: LMA inserted LMA Size: 4.0 Number of attempts: 1 Airway Equipment and Method: bite block Placement Confirmation: positive ETCO2 Tube secured with: Tape Dental Injury: Teeth and Oropharynx as per pre-operative assessment

## 2011-12-04 NOTE — Anesthesia Preprocedure Evaluation (Addendum)
Anesthesia Evaluation  Patient identified by MRN, date of birth, ID band Patient awake    Reviewed: Allergy & Precautions, H&P , NPO status , Patient's Chart, lab work & pertinent test results  Airway Mallampati: IV TM Distance: >3 FB Neck ROM: Full  Mouth opening: Limited Mouth Opening  Dental No notable dental hx.    Pulmonary neg pulmonary ROS,  breath sounds clear to auscultation  Pulmonary exam normal       Cardiovascular hypertension, Pt. on medications negative cardio ROS  + dysrhythmias Supra Ventricular Tachycardia Rhythm:Regular Rate:Normal     Neuro/Psych negative neurological ROS  negative psych ROS   GI/Hepatic negative GI ROS, Neg liver ROS,   Endo/Other  negative endocrine ROS  Renal/GU negative Renal ROS  negative genitourinary   Musculoskeletal negative musculoskeletal ROS (+)   Abdominal   Peds negative pediatric ROS (+)  Hematology negative hematology ROS (+)   Anesthesia Other Findings   Reproductive/Obstetrics negative OB ROS                          Anesthesia Physical Anesthesia Plan  ASA: II  Anesthesia Plan: General   Post-op Pain Management:    Induction: Intravenous  Airway Management Planned: LMA  Additional Equipment:   Intra-op Plan:   Post-operative Plan:   Informed Consent: I have reviewed the patients History and Physical, chart, labs and discussed the procedure including the risks, benefits and alternatives for the proposed anesthesia with the patient or authorized representative who has indicated his/her understanding and acceptance.   Dental advisory given  Plan Discussed with: CRNA  Anesthesia Plan Comments: (Potential difficult intubation)       Anesthesia Quick Evaluation

## 2011-12-04 NOTE — Transfer of Care (Signed)
Immediate Anesthesia Transfer of Care Note  Patient: Troy [name]  Procedure(s) Performed: Procedure(s) (LRB): SPERMATOCELECTOMY (Left)  Patient Location: PACU  Anesthesia Type: General  Level of Consciousness: awake, alert  and oriented  Airway & Oxygen Therapy: Patient Spontanous Breathing and Patient connected to face mask oxygen  Post-op Assessment: Report given to PACU RN and Post -op Vital signs reviewed and stable  Post vital signs: Reviewed and stable  Complications: No apparent anesthesia complications

## 2011-12-07 NOTE — Anesthesia Postprocedure Evaluation (Signed)
  Anesthesia Post-op Note  Patient: Troy [name]  Procedure(s) Performed: Procedure(s) (LRB): SPERMATOCELECTOMY (Left)  Patient Location: PACU  Anesthesia Type: General  Level of Consciousness: awake and alert   Airway and Oxygen Therapy: Patient Spontanous Breathing  Post-op Pain: mild  Post-op Assessment: Post-op Vital signs reviewed, Patient's Cardiovascular Status Stable, Respiratory Function Stable, Patent Airway and No signs of Nausea or vomiting  Post-op Vital Signs: stable  Complications: No apparent anesthesia complications

## 2011-12-11 ENCOUNTER — Encounter (HOSPITAL_BASED_OUTPATIENT_CLINIC_OR_DEPARTMENT_OTHER): Payer: Self-pay | Admitting: Urology

## 2011-12-18 DIAGNOSIS — N4340 Spermatocele of epididymis, unspecified: Secondary | ICD-10-CM | POA: Diagnosis not present

## 2012-01-25 DIAGNOSIS — N4340 Spermatocele of epididymis, unspecified: Secondary | ICD-10-CM | POA: Diagnosis not present

## 2012-02-03 ENCOUNTER — Other Ambulatory Visit: Payer: Self-pay | Admitting: Internal Medicine

## 2012-02-03 NOTE — Telephone Encounter (Signed)
Done erx 

## 2012-02-17 DIAGNOSIS — Z23 Encounter for immunization: Secondary | ICD-10-CM | POA: Diagnosis not present

## 2012-05-05 ENCOUNTER — Other Ambulatory Visit: Payer: Self-pay | Admitting: Internal Medicine

## 2012-06-10 ENCOUNTER — Other Ambulatory Visit: Payer: Self-pay | Admitting: Internal Medicine

## 2012-06-10 NOTE — Telephone Encounter (Signed)
Done erx 

## 2012-06-21 ENCOUNTER — Encounter: Payer: Self-pay | Admitting: Internal Medicine

## 2012-06-21 ENCOUNTER — Ambulatory Visit (INDEPENDENT_AMBULATORY_CARE_PROVIDER_SITE_OTHER): Payer: Medicare Other | Admitting: Internal Medicine

## 2012-06-21 VITALS — BP 140/92 | HR 72 | Temp 98.2°F | Ht 74.5 in | Wt 208.0 lb

## 2012-06-21 DIAGNOSIS — J019 Acute sinusitis, unspecified: Secondary | ICD-10-CM | POA: Diagnosis not present

## 2012-06-21 MED ORDER — FLUTICASONE PROPIONATE 50 MCG/ACT NA SUSP: 2.0000 | Freq: Every day | NASAL | Status: DC

## 2012-06-21 MED ORDER — AMOXICILLIN-POT CLAVULANATE 875-125 MG PO TABS: 1.0000 | ORAL_TABLET | Freq: Two times a day (BID) | ORAL | Status: DC

## 2012-06-21 NOTE — Progress Notes (Signed)
HPI  Pt presents to the clinic today with c/o nasal congestion, sinus pain and pressure with some dizziness. This started 4 days ago. He has tried Sudafed, Mucinex and Nyquil. The symptoms seem to be getting worse. He does have problems with allergies. He has had sick contacts.  Review of Systems    Past Medical History  Diagnosis Date  . ALLERGIC RHINITIS 05/04/2007  . BACK PAIN, LUMBAR 02/17/2010  . CHEST PAIN 07/15/2009  . CONSTIPATION 02/17/2010  . HYPERLIPIDEMIA 05/04/2007  . HYPERTENSION 05/04/2007  . NECK PAIN, ACUTE 07/15/2009  . PSVT 05/04/2007  . VERTIGO 05/04/2007  . Lumbar disc disease 10/01/2011  . Heart murmur     remote hx of murmur  . Prostate enlargement   . Spermatocele     Family History  Problem Relation Age of Onset  . Cancer Mother     breast cancer  . Dementia Mother   . Hypertension Mother   . Hyperlipidemia Other   . Diabetes Other   . Heart disease Other     History   Social History  . Marital Status: Married    Spouse Name: N/A    Number of Children: N/A  . Years of Education: N/A   Occupational History  . retired 41 yrs [employer]    Social History Main Topics  . Smoking status: Never Smoker   . Smokeless tobacco: Not on file  . Alcohol Use: No  . Drug Use: No  . Sexually Active: Not on file   Other Topics Concern  . Not on file   Social History Narrative  . No narrative on file    Allergies  Allergen Reactions  . Celebrex (Celecoxib) Anaphylaxis    Oral sores  . Cyclobenzaprine Hcl     REACTION: faical skin rash, 'hard cysts'  . Mobic (Meloxicam) Other (See Comments)    Sores in mouth     Constitutional: Positive headache, fatigue and fever. Denies abrupt weight changes.  HEENT:  Positive eye pain, pressure behind the eyes, facial pain, nasal congestion and sore throat. Denies eye redness, ear pain, ringing in the ears, wax buildup, runny nose or bloody nose. Respiratory: Positive cough and  thick green sputum production. Denies difficulty breathing or shortness of breath.  Cardiovascular: Denies chest pain, chest tightness, palpitations or swelling in the hands or feet.   No other specific complaints in a complete review of systems (except as listed in HPI above).  Objective:    BP 140/92  Pulse 72  Temp(Src) 98.2 F (36.8 C) (Oral)  Ht 6' 2.5" (1.892 m)  Wt 208 lb (94.348 kg)  BMI 26.36 kg/m2  SpO2 95% Wt Readings from Last 3 Encounters:  06/21/12 208 lb (94.348 kg)  12/04/11 188 lb (85.276 kg)  12/04/11 188 lb (85.276 kg)    General: Appears his stated age, well developed, well nourished in NAD. HEENT: Head: normal shape and size; Eyes: sclera white, no icterus, conjunctiva pink, PERRLA and EOMs intact; Ears: Tm's gray and intact, normal light reflex; Nose: mucosa pink and moist, septum midline; Throat/Mouth: + PND. Teeth present, mucosa pink and moist, no exudate noted, no lesions or ulcerations noted.  Neck: Mild cervical lymphadenopathy. Neck supple, trachea midline. No massses, lumps or thyromegaly present.  Cardiovascular: Normal rate and rhythm. S1,S2 noted.  No murmur, rubs or gallops noted. No JVD or BLE edema. No carotid bruits noted. Pulmonary/Chest: Normal effort and positive vesicular breath sounds. No respiratory distress. No wheezes, rales or  ronchi noted.      Assessment & Plan:   Acute bacterial sinusitis  Can use a Neti Pot which can be purchased from your local drug store. Flonase 2 sprays each nostril for 3 days and then as needed. Augmentin BID for 10 days  RTC as needed or if symptoms persist.

## 2012-06-21 NOTE — Patient Instructions (Signed)

## 2012-09-21 ENCOUNTER — Other Ambulatory Visit: Payer: Self-pay | Admitting: Internal Medicine

## 2012-09-23 DIAGNOSIS — M67919 Unspecified disorder of synovium and tendon, unspecified shoulder: Secondary | ICD-10-CM | POA: Diagnosis not present

## 2012-09-23 DIAGNOSIS — M719 Bursopathy, unspecified: Secondary | ICD-10-CM | POA: Diagnosis not present

## 2012-10-26 ENCOUNTER — Other Ambulatory Visit: Payer: Self-pay | Admitting: Internal Medicine

## 2012-11-14 ENCOUNTER — Other Ambulatory Visit: Payer: Self-pay | Admitting: Internal Medicine

## 2012-12-05 ENCOUNTER — Other Ambulatory Visit: Payer: Self-pay | Admitting: Internal Medicine

## 2012-12-05 NOTE — Telephone Encounter (Signed)
Done hardcopy to robin  

## 2012-12-05 NOTE — Telephone Encounter (Signed)
Faxed hardcopy to [facility] [place]  

## 2013-01-17 ENCOUNTER — Other Ambulatory Visit: Payer: Self-pay

## 2013-01-17 MED ORDER — TRAMADOL HCL 50 MG PO TABS: ORAL_TABLET | ORAL | Status: DC

## 2013-01-17 NOTE — Telephone Encounter (Signed)
Done hardcopy to robin  

## 2013-01-18 NOTE — Telephone Encounter (Signed)
Faxed hardcopy to [facility] [place]  

## 2013-02-08 DIAGNOSIS — Z23 Encounter for immunization: Secondary | ICD-10-CM | POA: Diagnosis not present

## 2013-02-16 ENCOUNTER — Telehealth: Payer: Self-pay

## 2013-02-16 MED ORDER — ATENOLOL 50 MG PO TABS: 50.0000 mg | ORAL_TABLET | Freq: Every day | ORAL | Status: DC

## 2013-02-16 NOTE — Telephone Encounter (Signed)
refill 

## 2013-02-28 ENCOUNTER — Other Ambulatory Visit: Payer: Self-pay | Admitting: Internal Medicine

## 2013-03-08 DIAGNOSIS — R3989 Other symptoms and signs involving the genitourinary system: Secondary | ICD-10-CM | POA: Diagnosis not present

## 2013-03-08 DIAGNOSIS — M545 Low back pain: Secondary | ICD-10-CM | POA: Diagnosis not present

## 2013-03-14 DIAGNOSIS — L821 Other seborrheic keratosis: Secondary | ICD-10-CM | POA: Diagnosis not present

## 2013-03-14 DIAGNOSIS — L570 Actinic keratosis: Secondary | ICD-10-CM | POA: Diagnosis not present

## 2013-03-14 DIAGNOSIS — D485 Neoplasm of uncertain behavior of skin: Secondary | ICD-10-CM | POA: Diagnosis not present

## 2013-03-14 DIAGNOSIS — D2330 Other benign neoplasm of skin of unspecified part of face: Secondary | ICD-10-CM | POA: Diagnosis not present

## 2013-03-14 DIAGNOSIS — D239 Other benign neoplasm of skin, unspecified: Secondary | ICD-10-CM | POA: Diagnosis not present

## 2013-03-14 DIAGNOSIS — C44319 Basal cell carcinoma of skin of other parts of face: Secondary | ICD-10-CM | POA: Diagnosis not present

## 2013-03-31 ENCOUNTER — Telehealth: Payer: Self-pay

## 2013-03-31 DIAGNOSIS — J019 Acute sinusitis, unspecified: Secondary | ICD-10-CM

## 2013-03-31 MED ORDER — FLUTICASONE PROPIONATE 50 MCG/ACT NA SUSP: 2.0000 | Freq: Every day | NASAL | Status: DC

## 2013-03-31 NOTE — Telephone Encounter (Signed)
refill 

## 2013-04-03 ENCOUNTER — Other Ambulatory Visit: Payer: Self-pay | Admitting: Internal Medicine

## 2013-04-04 NOTE — Telephone Encounter (Signed)
Patient informed of MD instructions. 

## 2013-04-04 NOTE — Telephone Encounter (Signed)
Please make ROV as last visit to me was may 2013

## 2013-04-11 ENCOUNTER — Other Ambulatory Visit: Payer: Self-pay | Admitting: Dermatology

## 2013-04-11 DIAGNOSIS — C44319 Basal cell carcinoma of skin of other parts of face: Secondary | ICD-10-CM | POA: Diagnosis not present

## 2013-04-11 DIAGNOSIS — Z85828 Personal history of other malignant neoplasm of skin: Secondary | ICD-10-CM | POA: Diagnosis not present

## 2013-04-19 DIAGNOSIS — Z85828 Personal history of other malignant neoplasm of skin: Secondary | ICD-10-CM | POA: Diagnosis not present

## 2013-04-19 DIAGNOSIS — C44319 Basal cell carcinoma of skin of other parts of face: Secondary | ICD-10-CM | POA: Diagnosis not present

## 2013-05-10 DIAGNOSIS — R3989 Other symptoms and signs involving the genitourinary system: Secondary | ICD-10-CM | POA: Diagnosis not present

## 2013-06-08 ENCOUNTER — Other Ambulatory Visit: Payer: Self-pay | Admitting: Internal Medicine

## 2013-07-13 ENCOUNTER — Other Ambulatory Visit: Payer: Self-pay | Admitting: Internal Medicine

## 2013-07-24 ENCOUNTER — Other Ambulatory Visit: Payer: Self-pay | Admitting: Internal Medicine

## 2013-08-04 ENCOUNTER — Other Ambulatory Visit: Payer: Self-pay | Admitting: Internal Medicine

## 2013-08-30 ENCOUNTER — Telehealth: Payer: Self-pay | Admitting: Internal Medicine

## 2013-08-30 ENCOUNTER — Encounter: Payer: Self-pay | Admitting: Internal Medicine

## 2013-08-30 ENCOUNTER — Ambulatory Visit (INDEPENDENT_AMBULATORY_CARE_PROVIDER_SITE_OTHER): Payer: Medicare Other | Admitting: Internal Medicine

## 2013-08-30 VITALS — BP 142/90 | HR 62 | Temp 98.0°F | Ht 74.5 in | Wt 216.4 lb

## 2013-08-30 DIAGNOSIS — E785 Hyperlipidemia, unspecified: Secondary | ICD-10-CM

## 2013-08-30 DIAGNOSIS — I10 Essential (primary) hypertension: Secondary | ICD-10-CM

## 2013-08-30 DIAGNOSIS — N3010 Interstitial cystitis (chronic) without hematuria: Secondary | ICD-10-CM

## 2013-08-30 DIAGNOSIS — J019 Acute sinusitis, unspecified: Secondary | ICD-10-CM

## 2013-08-30 DIAGNOSIS — C4490 Unspecified malignant neoplasm of skin, unspecified: Secondary | ICD-10-CM

## 2013-08-30 DIAGNOSIS — M545 Low back pain, unspecified: Secondary | ICD-10-CM | POA: Diagnosis not present

## 2013-08-30 DIAGNOSIS — Z23 Encounter for immunization: Secondary | ICD-10-CM | POA: Diagnosis not present

## 2013-08-30 HISTORY — DX: Interstitial cystitis (chronic) without hematuria: N30.10

## 2013-08-30 HISTORY — DX: Unspecified malignant neoplasm of skin, unspecified: C44.90

## 2013-08-30 MED ORDER — LOSARTAN POTASSIUM 100 MG PO TABS: 100.0000 mg | ORAL_TABLET | Freq: Every day | ORAL | Status: DC

## 2013-08-30 MED ORDER — ATENOLOL 50 MG PO TABS: 50.0000 mg | ORAL_TABLET | Freq: Every day | ORAL | Status: DC

## 2013-08-30 MED ORDER — FLUTICASONE PROPIONATE 50 MCG/ACT NA SUSP: 2.0000 | Freq: Every day | NASAL | Status: DC

## 2013-08-30 MED ORDER — TRAMADOL HCL 50 MG PO TABS: ORAL_TABLET | ORAL | Status: DC

## 2013-08-30 MED ORDER — ATORVASTATIN CALCIUM 40 MG PO TABS: 40.0000 mg | ORAL_TABLET | Freq: Every day | ORAL | Status: DC

## 2013-08-30 NOTE — Patient Instructions (Addendum)
You had the new Prevnar pneumonia shot today  Please take all new medication as prescribed - the losartan 100 mg per day  Please continue to follow your blood pressure on a regular basis; your goal is to be less than 140/90  Please continue all other medications as before, and refills have been done  Please have the pharmacy call with any other refills you may need.  Please go to the LAB in the Basement (turn left off the elevator) for the tests to be done at your convenience  You will be contacted by phone if any changes need to be made immediately.  Otherwise, you will receive a letter about your results with an explanation, but please check with MyChart first.  Please remember to sign up for MyChart if you have not done so, as this will be important to you in the future with finding out test results, communicating by private email, and scheduling acute appointments online when needed.  Please keep your appointments with your specialists as you have planned  - Dr Janice Norrie with the PSA  Please return in 6 months, or sooner if needed

## 2013-08-30 NOTE — Progress Notes (Signed)
Subjective:    Patient ID: Troy [name], male    DOB: Sep 20, 1945, 68 y.o.   MRN: [id number]  HPI  Here for yearly f/u;  Overall doing ok;  Pt denies CP, worsening SOB, DOE, wheezing, orthopnea, PND, worsening LE edema, palpitations, dizziness or syncope.  Pt denies neurological change such as new headache, facial or extremity weakness.  Pt denies polydipsia, polyuria, or low sugar symptoms. Pt states overall good compliance with treatment and medications, good tolerability, and has been trying to follow lower cholesterol diet.  Pt denies worsening depressive symptoms, suicidal ideation or panic. No fever, night sweats, wt loss, loss of appetite, or other constitutional symptoms.  Pt states good ability with ADL's, has low fall risk, home safety reviewed and adequate, no other significant changes in hearing or vision.  S/p recent skin cancer tx.  Has new dx of IC per urology.  Pt continues to have recurring LBP without change in severity, bowel or bladder change, fever, wt loss,  worsening LE pain/numbness/weakness, gait change or falls - has known lumbar disc dz and recurring pain, worse to play golf. PT helped.  Also with recent left elbow tendonitis with golf, plays every few days, now retired.  Sees ortho for cortisone.  Sees Dr Nesi/urology again in June, Shaune Leeks with PSA per pt. BP has been elevted mild at home in past few months, per daughter who is NP working in [place]. Past Medical History  Diagnosis Date  . ALLERGIC RHINITIS 05/04/2007  . BACK PAIN, LUMBAR 02/17/2010  . CHEST PAIN 07/15/2009  . CONSTIPATION 02/17/2010  . HYPERLIPIDEMIA 05/04/2007  . HYPERTENSION 05/04/2007  . NECK PAIN, ACUTE 07/15/2009  . PSVT 05/04/2007  . VERTIGO 05/04/2007  . Lumbar disc disease 10/01/2011  . Heart murmur     remote hx of murmur  . Prostate enlargement   . Spermatocele   . Interstitial cystitis 08/30/2013   Past Surgical History  Procedure Laterality Date  . S/p rk surgery bilat    .  Appendectomy    . Tonsillectomy    . S/p dental implant left upper jaw  11/09    also rt lower jaw  . Knee arthroscopy  30 yrs ago  . Spermatocelectomy  12/04/2011    Procedure: SPERMATOCELECTOMY;  Surgeon: Hanley Ben, MD;  Location: [facility];  Service: Urology;  Laterality: Left;  1 hour requested for this case     reports that he has never smoked. He does not have any smokeless tobacco history on file. He reports that he does not drink alcohol or use illicit drugs. family history includes Cancer in his mother; Dementia in his mother; Diabetes in his other; Heart disease in his other; Hyperlipidemia in his other; Hypertension in his mother. Allergies  Allergen Reactions  . Celebrex [Celecoxib] Anaphylaxis    Oral sores  . Cyclobenzaprine Hcl     REACTION: faical skin rash, 'hard cysts'  . Mobic [Meloxicam] Other (See Comments)    Sores in mouth   Current Outpatient Prescriptions on File Prior to Visit  Medication Sig Dispense Refill  . aspirin 81 MG EC tablet Take 81 mg by mouth daily.        . fluticasone (FLONASE) 50 MCG/ACT nasal spray Place 2 sprays into both nostrils daily.  16 g  11  . Probiotic Product (PROBIOTIC PO) Take by mouth daily.        . traMADol (ULTRAM) 50 MG tablet TAKE ONE TABLET BY MOUTH EVERY 6 HOURS AS NEEDED  FOR PAIN  120 tablet  2   No current facility-administered medications on file prior to visit.     Review of Systems Constitutional: Negative for increased diaphoresis, other activity, appetite or other siginficant weight change  HENT: Negative for worsening hearing loss, ear pain, facial swelling, mouth sores and neck stiffness.   Eyes: Negative for other worsening pain, redness or visual disturbance.  Respiratory: Negative for shortness of breath and wheezing.   Cardiovascular: Negative for chest pain and palpitations.  Gastrointestinal: Negative for diarrhea, blood in stool, abdominal distention or other pain Genitourinary:  Negative for hematuria, flank pain or change in urine volume.  Musculoskeletal: Negative for myalgias or other joint complaints.  Skin: Negative for color change and wound.  Neurological: Negative for syncope and numbness. other than noted Hematological: Negative for adenopathy. or other swelling Psychiatric/Behavioral: Negative for hallucinations, self-injury, decreased concentration or other worsening agitation.      Objective:   Physical Exam BP 142/90  Pulse 62  Temp(Src) 98 F (36.7 C) (Oral)  Ht 6' 2.5" (1.892 m)  Wt 216 lb 6 oz (98.147 kg)  BMI 27.42 kg/m2  SpO2 97% VS noted,  Constitutional: Pt is oriented to person, place, and time. Appears well-developed and well-nourished.  Head: Normocephalic and atraumatic.  Right Ear: External ear normal.  Left Ear: External ear normal.  Nose: Nose normal.  Mouth/Throat: Oropharynx is clear and moist.  Eyes: Conjunctivae and EOM are normal. Pupils are equal, round, and reactive to light.  Neck: Normal range of motion. Neck supple. No JVD present. No tracheal deviation present.  Cardiovascular: Normal rate, regular rhythm, normal heart sounds and intact distal pulses.   Pulmonary/Chest: Effort normal and breath sounds without rales or wheezing  Abdominal: Soft. Bowel sounds are normal. NT. No HSM  Musculoskeletal: Normal range of motion. Exhibits no edema.  Lymphadenopathy:  Has no cervical adenopathy.  Neurological: Pt is alert and oriented to person, place, and time. Pt has normal reflexes. No cranial nerve deficit. Motor grossly intact Skin: Skin is warm and dry. No rash noted.  Psychiatric:  Has normal mood and affect. Behavior is normal.     Assessment & Plan:

## 2013-08-30 NOTE — Progress Notes (Signed)
Pre visit review using our clinic review tool, if applicable. No additional management support is needed unless otherwise documented below in the visit note. 

## 2013-08-30 NOTE — Telephone Encounter (Signed)
Message copied by Biagio Borg on Wed Aug 30, 2013  8:04 PM ------      Message from: Sharon Seller B      Created: Wed Aug 30, 2013  5:59 PM       The patient would like his tramadol refilled and sent to [facility] ------

## 2013-08-30 NOTE — Addendum Note (Signed)
Addended by: Sharon Seller B on: 08/30/2013 06:01 PM   Modules accepted: Orders

## 2013-08-30 NOTE — Telephone Encounter (Signed)
Pt was given hardcopy at [facility]

## 2013-08-31 MED ORDER — TRAMADOL HCL 50 MG PO TABS: ORAL_TABLET | ORAL | Status: DC

## 2013-08-31 NOTE — Telephone Encounter (Signed)
I looked through his AVS paperwork last night and he did not have hardcopy

## 2013-08-31 NOTE — Telephone Encounter (Signed)
Done again

## 2013-08-31 NOTE — Addendum Note (Signed)
Addended by: Biagio Borg on: 08/31/2013 08:34 AM   Modules accepted: Orders

## 2013-08-31 NOTE — Telephone Encounter (Signed)
Faxed hardcopy to [facility] 

## 2013-09-07 ENCOUNTER — Other Ambulatory Visit (INDEPENDENT_AMBULATORY_CARE_PROVIDER_SITE_OTHER): Payer: Medicare Other

## 2013-09-07 ENCOUNTER — Encounter: Payer: Self-pay | Admitting: Internal Medicine

## 2013-09-07 DIAGNOSIS — E785 Hyperlipidemia, unspecified: Secondary | ICD-10-CM

## 2013-09-07 LAB — CBC WITH DIFFERENTIAL/PLATELET
BASOS ABS: 0 10*3/uL (ref 0.0–0.1)
Basophils Relative: 0.4 % (ref 0.0–3.0)
EOS PCT: 5.6 % — AB (ref 0.0–5.0)
Eosinophils Absolute: 0.3 10*3/uL (ref 0.0–0.7)
HEMATOCRIT: 43.8 % (ref 39.0–52.0)
HEMOGLOBIN: 15 g/dL (ref 13.0–17.0)
LYMPHS PCT: 28.3 % (ref 12.0–46.0)
Lymphs Abs: 1.7 10*3/uL (ref 0.7–4.0)
MCHC: 34.2 g/dL (ref 30.0–36.0)
MCV: 88.9 fl (ref 78.0–100.0)
MONOS PCT: 8.9 % (ref 3.0–12.0)
Monocytes Absolute: 0.5 10*3/uL (ref 0.1–1.0)
NEUTROS ABS: 3.3 10*3/uL (ref 1.4–7.7)
Neutrophils Relative %: 56.8 % (ref 43.0–77.0)
Platelets: 208 10*3/uL (ref 150.0–400.0)
RBC: 4.93 Mil/uL (ref 4.22–5.81)
RDW: 12.8 % (ref 11.5–15.5)
WBC: 5.8 10*3/uL (ref 4.0–10.5)

## 2013-09-07 LAB — LIPID PANEL
CHOLESTEROL: 187 mg/dL (ref 0–200)
HDL: 53.2 mg/dL (ref >39.00)
LDL Cholesterol: 88 mg/dL (ref 0–99)
TRIGLYCERIDES: 230 mg/dL — AB (ref 0.0–149.0)
Total CHOL/HDL Ratio: 4
VLDL: 46 mg/dL — AB (ref 0.0–40.0)

## 2013-09-07 LAB — BASIC METABOLIC PANEL
BUN: 20 mg/dL (ref 6–23)
CALCIUM: 9.1 mg/dL (ref 8.4–10.5)
CO2: 29 meq/L (ref 19–32)
CREATININE: 1.1 mg/dL (ref 0.4–1.5)
Chloride: 104 mEq/L (ref 96–112)
GFR: 74.37 mL/min (ref >60.00)
GLUCOSE: 86 mg/dL (ref 70–99)
Potassium: 4.1 mEq/L (ref 3.5–5.1)
SODIUM: 138 meq/L (ref 135–145)

## 2013-09-07 LAB — HEPATIC FUNCTION PANEL
ALBUMIN: 4.1 g/dL (ref 3.5–5.2)
ALK PHOS: 54 U/L (ref 39–117)
ALT: 33 U/L (ref 0–53)
AST: 27 U/L (ref 0–37)
Bilirubin, Direct: 0.1 mg/dL (ref 0.0–0.3)
TOTAL PROTEIN: 7 g/dL (ref 6.0–8.3)
Total Bilirubin: 0.7 mg/dL (ref 0.2–1.2)

## 2013-09-07 LAB — TSH: TSH: 2.13 u[IU]/mL (ref 0.35–4.50)

## 2013-10-25 DIAGNOSIS — Z85828 Personal history of other malignant neoplasm of skin: Secondary | ICD-10-CM | POA: Diagnosis not present

## 2013-11-02 DIAGNOSIS — N401 Enlarged prostate with lower urinary tract symptoms: Secondary | ICD-10-CM | POA: Diagnosis not present

## 2013-11-02 DIAGNOSIS — N139 Obstructive and reflux uropathy, unspecified: Secondary | ICD-10-CM | POA: Diagnosis not present

## 2013-11-09 DIAGNOSIS — N281 Cyst of kidney, acquired: Secondary | ICD-10-CM | POA: Diagnosis not present

## 2013-11-09 DIAGNOSIS — N509 Disorder of male genital organs, unspecified: Secondary | ICD-10-CM | POA: Diagnosis not present

## 2013-11-09 DIAGNOSIS — N401 Enlarged prostate with lower urinary tract symptoms: Secondary | ICD-10-CM | POA: Diagnosis not present

## 2013-11-09 DIAGNOSIS — N453 Epididymo-orchitis: Secondary | ICD-10-CM | POA: Diagnosis not present

## 2013-12-13 DIAGNOSIS — N453 Epididymo-orchitis: Secondary | ICD-10-CM | POA: Diagnosis not present

## 2013-12-13 DIAGNOSIS — N508 Other specified disorders of male genital organs: Secondary | ICD-10-CM | POA: Diagnosis not present

## 2013-12-13 DIAGNOSIS — N509 Disorder of male genital organs, unspecified: Secondary | ICD-10-CM | POA: Diagnosis not present

## 2013-12-19 ENCOUNTER — Encounter: Payer: Self-pay | Admitting: Gastroenterology

## 2014-01-29 ENCOUNTER — Other Ambulatory Visit: Payer: Self-pay | Admitting: Internal Medicine

## 2014-01-31 NOTE — Telephone Encounter (Signed)
Faxed hardcopy for Tramadol to [facility]

## 2014-01-31 NOTE — Telephone Encounter (Signed)
30d rx authorized, no refills - covering in absence of usual PCP Printed and signed

## 2014-02-09 DIAGNOSIS — Z23 Encounter for immunization: Secondary | ICD-10-CM | POA: Diagnosis not present

## 2014-02-28 ENCOUNTER — Ambulatory Visit (INDEPENDENT_AMBULATORY_CARE_PROVIDER_SITE_OTHER): Payer: Medicare Other | Admitting: Internal Medicine

## 2014-02-28 ENCOUNTER — Encounter: Payer: Self-pay | Admitting: Internal Medicine

## 2014-02-28 VITALS — BP 110/78 | HR 69 | Temp 97.9°F | Ht 74.5 in | Wt 224.5 lb

## 2014-02-28 DIAGNOSIS — M545 Low back pain, unspecified: Secondary | ICD-10-CM

## 2014-02-28 DIAGNOSIS — E785 Hyperlipidemia, unspecified: Secondary | ICD-10-CM | POA: Diagnosis not present

## 2014-02-28 DIAGNOSIS — I10 Essential (primary) hypertension: Secondary | ICD-10-CM | POA: Diagnosis not present

## 2014-02-28 NOTE — Progress Notes (Signed)
Pre visit review using our clinic review tool, if applicable. No additional management support is needed unless otherwise documented below in the visit note. 

## 2014-02-28 NOTE — Patient Instructions (Signed)
Please continue all other medications as before, and refills have been done if requested.  Please have the pharmacy call with any other refills you may need.  Please continue your efforts at being more active, low cholesterol diet, and weight control.  You are otherwise up to date with prevention measures today.  Please keep your appointments with your specialists as you may have planned  Please return in 6 months, or sooner if needed 

## 2014-02-28 NOTE — Progress Notes (Signed)
Subjective:    Patient ID: Troy [name], male    DOB: 1945-11-06, 68 y.o.   MRN: [id number]  HPI  Here to f/u; overall doing ok,  Pt denies chest pain, increased sob or doe, wheezing, orthopnea, PND, increased LE swelling, palpitations, dizziness or syncope.  Pt denies polydipsia, polyuria, or low sugar symptoms such as weakness or confusion improved with po intake.  Pt denies new neurological symptoms such as new headache, or facial or extremity weakness or numbness.   Pt states overall good compliance with meds, has been trying to follow lower cholesterol diet, with wt overall up several lbs,  but little exercise however.  Pt continues to have recurring LBP with change in severity pain, no bowel or bladder change, fever, wt loss,  worsening LE pain/numbness/weakness, gait change or falls/.  Has seen Dr gioffre/Orthopedics with [facility] djd, tx with prednisone and PT.  Pt asks for repeat PT.  Has seen urology with hydroceles, no surgury needed. Past Medical History  Diagnosis Date  . ALLERGIC RHINITIS 05/04/2007  . BACK PAIN, LUMBAR 02/17/2010  . CHEST PAIN 07/15/2009  . CONSTIPATION 02/17/2010  . HYPERLIPIDEMIA 05/04/2007  . HYPERTENSION 05/04/2007  . NECK PAIN, ACUTE 07/15/2009  . PSVT 05/04/2007  . VERTIGO 05/04/2007  . Lumbar disc disease 10/01/2011  . Heart murmur     remote hx of murmur  . Prostate enlargement   . Spermatocele   . Interstitial cystitis 08/30/2013  . Skin cancer 08/30/2013    Basal cell x 2, dec 2014   Past Surgical History  Procedure Laterality Date  . S/p rk surgery bilat    . Appendectomy    . Tonsillectomy    . S/p dental implant left upper jaw  11/09    also rt lower jaw  . Knee arthroscopy  30 yrs ago  . Spermatocelectomy  12/04/2011    Procedure: SPERMATOCELECTOMY;  Surgeon: Hanley Ben, MD;  Location: [facility];  Service: Urology;  Laterality: Left;  1 hour requested for this case     reports that he has never smoked. He does not  have any smokeless tobacco history on file. He reports that he does not drink alcohol or use illicit drugs. family history includes Cancer in his mother; Dementia in his mother; Diabetes in his other; Heart disease in his other; Hyperlipidemia in his other; Hypertension in his mother. Allergies  Allergen Reactions  . Celebrex [Celecoxib] Anaphylaxis    Oral sores  . Cyclobenzaprine Hcl     REACTION: faical skin rash, 'hard cysts'  . Mobic [Meloxicam] Other (See Comments)    Sores in mouth   Current Outpatient Prescriptions on File Prior to Visit  Medication Sig Dispense Refill  . aspirin 81 MG EC tablet Take 81 mg by mouth daily.        Marland Kitchen atenolol (TENORMIN) 50 MG tablet Take 1 tablet (50 mg total) by mouth daily.  90 tablet  3  . atorvastatin (LIPITOR) 40 MG tablet Take 1 tablet (40 mg total) by mouth daily at 6 PM.  90 tablet  3  . fluticasone (FLONASE) 50 MCG/ACT nasal spray Place 2 sprays into both nostrils daily.  16 g  11  . losartan (COZAAR) 100 MG tablet Take 1 tablet (100 mg total) by mouth daily.  90 tablet  3  . Probiotic Product (PROBIOTIC PO) Take by mouth daily.        . traMADol (ULTRAM) 50 MG tablet TAKE ONE TABLET BY MOUTH EVERY  6 HOURS AS NEEDED FOR PAIN  120 tablet  0   No current facility-administered medications on file prior to visit.   Review of Systems  Constitutional: Negative for unusual diaphoresis or other sweats  HENT: Negative for ringing in ear Eyes: Negative for double vision or worsening visual disturbance.  Respiratory: Negative for choking and stridor.   Gastrointestinal: Negative for vomiting or other signifcant bowel change Genitourinary: Negative for hematuria or decreased urine volume.  Musculoskeletal: Negative for other MSK pain or swelling Skin: Negative for color change and worsening wound.  Neurological: Negative for tremors and numbness other than noted  Psychiatric/Behavioral: Negative for decreased concentration or agitation other than  above       Objective:   Physical Exam BP 110/78  Pulse 69  Temp(Src) 97.9 F (36.6 C) (Oral)  Ht 6' 2.5" (1.892 m)  Wt 224 lb 8 oz (101.833 kg)  BMI 28.45 kg/m2  SpO2 94% VS noted,  Constitutional: Pt appears well-developed, well-nourished.  HENT: Head: NCAT.  Right Ear: External ear normal.  Left Ear: External ear normal.  Eyes: . Pupils are equal, round, and reactive to light. Conjunctivae and EOM are normal Neck: Normal range of motion. Neck supple.  Cardiovascular: Normal rate and regular rhythm.   Pulmonary/Chest: Effort normal and breath sounds normal.  Abd:  Soft, NT, ND, + BS Neurological: Pt is alert. Not confused , motor grossly intact Skin: Skin is warm. No rash Psychiatric: Pt behavior is normal. No agitation.        Assessment & Plan:

## 2014-03-03 NOTE — Assessment & Plan Note (Signed)
stable overall by history and exam, recent data reviewed with pt, and pt to continue medical treatment as before,  to f/u any worsening symptoms or concerns Lab Results  Component Value Date   LDLCALC 88 09/07/2013

## 2014-03-03 NOTE — Assessment & Plan Note (Addendum)
Chronic stable without neuro change but increased pain severity and freq - for PT repeat, cont same tx,  to f/u any worsening symptoms or concerns

## 2014-03-03 NOTE — Assessment & Plan Note (Signed)
stable overall by history and exam, recent data reviewed with pt, and pt to continue medical treatment as before,  to f/u any worsening symptoms or concerns BP Readings from Last 3 Encounters:  02/28/14 110/78  08/30/13 142/90  06/21/12 140/92

## 2014-03-20 ENCOUNTER — Other Ambulatory Visit: Payer: Self-pay | Admitting: Internal Medicine

## 2014-03-20 NOTE — Telephone Encounter (Signed)
Done hardcopy to robin  

## 2014-03-20 NOTE — Telephone Encounter (Signed)
Faxed hardcopy for Tramadol To [facility] Dr Letta Kocher

## 2014-04-02 DIAGNOSIS — M5136 Other intervertebral disc degeneration, lumbar region: Secondary | ICD-10-CM | POA: Diagnosis not present

## 2014-04-04 DIAGNOSIS — M5136 Other intervertebral disc degeneration, lumbar region: Secondary | ICD-10-CM | POA: Diagnosis not present

## 2014-04-09 DIAGNOSIS — M5136 Other intervertebral disc degeneration, lumbar region: Secondary | ICD-10-CM | POA: Diagnosis not present

## 2014-04-11 DIAGNOSIS — M5136 Other intervertebral disc degeneration, lumbar region: Secondary | ICD-10-CM | POA: Diagnosis not present

## 2014-04-19 DIAGNOSIS — M5136 Other intervertebral disc degeneration, lumbar region: Secondary | ICD-10-CM | POA: Diagnosis not present

## 2014-04-23 DIAGNOSIS — M5136 Other intervertebral disc degeneration, lumbar region: Secondary | ICD-10-CM | POA: Diagnosis not present

## 2014-04-30 DIAGNOSIS — M5136 Other intervertebral disc degeneration, lumbar region: Secondary | ICD-10-CM | POA: Diagnosis not present

## 2014-05-03 ENCOUNTER — Other Ambulatory Visit: Payer: Self-pay | Admitting: Dermatology

## 2014-05-03 DIAGNOSIS — D485 Neoplasm of uncertain behavior of skin: Secondary | ICD-10-CM | POA: Diagnosis not present

## 2014-05-03 DIAGNOSIS — L820 Inflamed seborrheic keratosis: Secondary | ICD-10-CM | POA: Diagnosis not present

## 2014-05-03 DIAGNOSIS — L821 Other seborrheic keratosis: Secondary | ICD-10-CM | POA: Diagnosis not present

## 2014-05-03 DIAGNOSIS — D2262 Melanocytic nevi of left upper limb, including shoulder: Secondary | ICD-10-CM | POA: Diagnosis not present

## 2014-05-03 DIAGNOSIS — C4401 Basal cell carcinoma of skin of lip: Secondary | ICD-10-CM | POA: Diagnosis not present

## 2014-05-03 DIAGNOSIS — Z85828 Personal history of other malignant neoplasm of skin: Secondary | ICD-10-CM | POA: Diagnosis not present

## 2014-05-03 DIAGNOSIS — D225 Melanocytic nevi of trunk: Secondary | ICD-10-CM | POA: Diagnosis not present

## 2014-06-07 DIAGNOSIS — Z85828 Personal history of other malignant neoplasm of skin: Secondary | ICD-10-CM | POA: Diagnosis not present

## 2014-06-07 DIAGNOSIS — C4401 Basal cell carcinoma of skin of lip: Secondary | ICD-10-CM | POA: Diagnosis not present

## 2014-08-21 ENCOUNTER — Other Ambulatory Visit: Payer: Self-pay | Admitting: Internal Medicine

## 2014-08-21 MED ORDER — TRAMADOL HCL 50 MG PO TABS: 50.0000 mg | ORAL_TABLET | Freq: Four times a day (QID) | ORAL | Status: DC | PRN

## 2014-08-21 NOTE — Telephone Encounter (Signed)
Done hardcopy to [facility]  

## 2014-08-21 NOTE — Telephone Encounter (Signed)
Sent to pharmacy 

## 2014-09-19 DIAGNOSIS — H25013 Cortical age-related cataract, bilateral: Secondary | ICD-10-CM | POA: Diagnosis not present

## 2014-09-19 DIAGNOSIS — M3501 Sicca syndrome with keratoconjunctivitis: Secondary | ICD-10-CM | POA: Diagnosis not present

## 2014-09-19 DIAGNOSIS — H04123 Dry eye syndrome of bilateral lacrimal glands: Secondary | ICD-10-CM | POA: Diagnosis not present

## 2014-09-19 DIAGNOSIS — D3131 Benign neoplasm of right choroid: Secondary | ICD-10-CM | POA: Diagnosis not present

## 2014-09-19 DIAGNOSIS — H2513 Age-related nuclear cataract, bilateral: Secondary | ICD-10-CM | POA: Diagnosis not present

## 2014-10-30 ENCOUNTER — Other Ambulatory Visit: Payer: Self-pay

## 2014-10-30 MED ORDER — ATORVASTATIN CALCIUM 40 MG PO TABS: 40.0000 mg | ORAL_TABLET | Freq: Every day | ORAL | Status: DC

## 2014-11-10 DIAGNOSIS — L235 Allergic contact dermatitis due to other chemical products: Secondary | ICD-10-CM | POA: Diagnosis not present

## 2014-11-21 ENCOUNTER — Other Ambulatory Visit: Payer: Self-pay | Admitting: Internal Medicine

## 2014-12-17 DIAGNOSIS — N503 Cyst of epididymis: Secondary | ICD-10-CM | POA: Diagnosis not present

## 2014-12-17 DIAGNOSIS — R351 Nocturia: Secondary | ICD-10-CM | POA: Diagnosis not present

## 2014-12-17 DIAGNOSIS — N401 Enlarged prostate with lower urinary tract symptoms: Secondary | ICD-10-CM | POA: Diagnosis not present

## 2015-02-13 ENCOUNTER — Other Ambulatory Visit: Payer: Self-pay | Admitting: Internal Medicine

## 2015-02-15 ENCOUNTER — Other Ambulatory Visit: Payer: Self-pay | Admitting: Internal Medicine

## 2015-02-18 ENCOUNTER — Other Ambulatory Visit: Payer: Self-pay | Admitting: Internal Medicine

## 2015-02-18 ENCOUNTER — Telehealth: Payer: Self-pay

## 2015-02-18 NOTE — Telephone Encounter (Signed)
No [id number]; Patient called to educate on Medicare Wellness apt. LVM for the patient to call back to educate and schedule for wellness visit.

## 2015-02-20 ENCOUNTER — Telehealth: Payer: Self-pay | Admitting: Internal Medicine

## 2015-02-20 DIAGNOSIS — Z23 Encounter for immunization: Secondary | ICD-10-CM | POA: Diagnosis not present

## 2015-02-20 NOTE — Telephone Encounter (Signed)
Patient has a medication management appt on Tuesday. He requests a temporary script for atenolol (TENORMIN) 50 MG tablet [[id number]] be sent in to [place] [address]. He is down to 1 pill

## 2015-02-20 NOTE — Telephone Encounter (Signed)
2nd outreach for AWV; LVM to call back if interested to [phone]

## 2015-02-21 MED ORDER — ATENOLOL 50 MG PO TABS: 50.0000 mg | ORAL_TABLET | Freq: Every day | ORAL | Status: DC

## 2015-02-21 NOTE — Telephone Encounter (Signed)
Done. See meds. Left detailed informing pt.

## 2015-02-26 ENCOUNTER — Other Ambulatory Visit (INDEPENDENT_AMBULATORY_CARE_PROVIDER_SITE_OTHER): Payer: Medicare Other

## 2015-02-26 ENCOUNTER — Encounter: Payer: Self-pay | Admitting: Internal Medicine

## 2015-02-26 ENCOUNTER — Ambulatory Visit (INDEPENDENT_AMBULATORY_CARE_PROVIDER_SITE_OTHER): Payer: Medicare Other | Admitting: Internal Medicine

## 2015-02-26 VITALS — BP 160/80 | HR 75 | Temp 98.0°F | Ht 74.5 in | Wt 226.5 lb

## 2015-02-26 DIAGNOSIS — Z20828 Contact with and (suspected) exposure to other viral communicable diseases: Secondary | ICD-10-CM

## 2015-02-26 DIAGNOSIS — E785 Hyperlipidemia, unspecified: Secondary | ICD-10-CM

## 2015-02-26 DIAGNOSIS — J309 Allergic rhinitis, unspecified: Secondary | ICD-10-CM

## 2015-02-26 DIAGNOSIS — I10 Essential (primary) hypertension: Secondary | ICD-10-CM | POA: Diagnosis not present

## 2015-02-26 LAB — TSH: TSH: 2.69 u[IU]/mL (ref 0.35–4.50)

## 2015-02-26 LAB — CBC WITH DIFFERENTIAL/PLATELET
BASOS ABS: 0.1 10*3/uL (ref 0.0–0.1)
Basophils Relative: 1.1 % (ref 0.0–3.0)
EOS ABS: 0.4 10*3/uL (ref 0.0–0.7)
Eosinophils Relative: 6.2 % — ABNORMAL HIGH (ref 0.0–5.0)
HEMATOCRIT: 44.3 % (ref 39.0–52.0)
Hemoglobin: 15.1 g/dL (ref 13.0–17.0)
LYMPHS PCT: 27.4 % (ref 12.0–46.0)
Lymphs Abs: 2 10*3/uL (ref 0.7–4.0)
MCHC: 34 g/dL (ref 30.0–36.0)
MCV: 88.9 fl (ref 78.0–100.0)
MONOS PCT: 9.5 % (ref 3.0–12.0)
Monocytes Absolute: 0.7 10*3/uL (ref 0.1–1.0)
NEUTROS PCT: 55.8 % (ref 43.0–77.0)
Neutro Abs: 4 10*3/uL (ref 1.4–7.7)
PLATELETS: 207 10*3/uL (ref 150.0–400.0)
RBC: 4.98 Mil/uL (ref 4.22–5.81)
RDW: 13.1 % (ref 11.5–15.5)
WBC: 7.2 10*3/uL (ref 4.0–10.5)

## 2015-02-26 LAB — URINALYSIS, ROUTINE W REFLEX MICROSCOPIC
BILIRUBIN URINE: NEGATIVE
HGB URINE DIPSTICK: NEGATIVE
Ketones, ur: NEGATIVE
LEUKOCYTES UA: NEGATIVE
NITRITE: NEGATIVE
Specific Gravity, Urine: 1.02 (ref 1.000–1.030)
TOTAL PROTEIN, URINE-UPE24: NEGATIVE
URINE GLUCOSE: NEGATIVE
UROBILINOGEN UA: 0.2 (ref 0.0–1.0)
pH: 7 (ref 5.0–8.0)

## 2015-02-26 LAB — BASIC METABOLIC PANEL
BUN: 23 mg/dL (ref 6–23)
CHLORIDE: 105 meq/L (ref 96–112)
CO2: 27 meq/L (ref 19–32)
Calcium: 9.8 mg/dL (ref 8.4–10.5)
Creatinine, Ser: 0.98 mg/dL (ref 0.40–1.50)
GFR: 80.19 mL/min (ref >60.00)
Glucose, Bld: 113 mg/dL — ABNORMAL HIGH (ref 70–99)
Potassium: 3.8 mEq/L (ref 3.5–5.1)
SODIUM: 140 meq/L (ref 135–145)

## 2015-02-26 LAB — HEPATIC FUNCTION PANEL
ALBUMIN: 4.3 g/dL (ref 3.5–5.2)
ALT: 27 U/L (ref 0–53)
AST: 20 U/L (ref 0–37)
Alkaline Phosphatase: 56 U/L (ref 39–117)
Bilirubin, Direct: 0.1 mg/dL (ref 0.0–0.3)
TOTAL PROTEIN: 7.3 g/dL (ref 6.0–8.3)
Total Bilirubin: 0.3 mg/dL (ref 0.2–1.2)

## 2015-02-26 LAB — LIPID PANEL
CHOL/HDL RATIO: 5
Cholesterol: 201 mg/dL — ABNORMAL HIGH (ref 0–200)
HDL: 44.6 mg/dL (ref >39.00)
Triglycerides: 438 mg/dL — ABNORMAL HIGH (ref 0.0–149.0)

## 2015-02-26 LAB — LDL CHOLESTEROL, DIRECT: LDL DIRECT: 101 mg/dL

## 2015-02-26 LAB — HEPATITIS C ANTIBODY: HCV Ab: NEGATIVE

## 2015-02-26 MED ORDER — FLUTICASONE PROPIONATE 50 MCG/ACT NA SUSP: 2.0000 | Freq: Every day | NASAL | Status: DC

## 2015-02-26 MED ORDER — LOSARTAN POTASSIUM 100 MG PO TABS: 100.0000 mg | ORAL_TABLET | Freq: Every day | ORAL | Status: DC

## 2015-02-26 MED ORDER — ATENOLOL 50 MG PO TABS: 50.0000 mg | ORAL_TABLET | Freq: Every day | ORAL | Status: DC

## 2015-02-26 MED ORDER — TRAMADOL HCL 50 MG PO TABS: 50.0000 mg | ORAL_TABLET | Freq: Four times a day (QID) | ORAL | Status: DC | PRN

## 2015-02-26 MED ORDER — ATORVASTATIN CALCIUM 40 MG PO TABS: 40.0000 mg | ORAL_TABLET | Freq: Every day | ORAL | Status: DC

## 2015-02-26 MED ORDER — MONTELUKAST SODIUM 10 MG PO TABS: 10.0000 mg | ORAL_TABLET | Freq: Every day | ORAL | Status: DC

## 2015-02-26 NOTE — Assessment & Plan Note (Signed)
stable overall by history and exam, recent data reviewed with pt, and pt to continue medical treatment as before,  to f/u any worsening symptoms or concerns Lab Results  Component Value Date   LDLCALC 88 09/07/2013

## 2015-02-26 NOTE — Assessment & Plan Note (Signed)
stable overall by history and exam, recent data reviewed with pt, and pt to continue medical treatment as before,  to f/u any worsening symptoms or concerns, for singulair refill

## 2015-02-26 NOTE — Progress Notes (Signed)
Subjective:    Patient ID: Troy [name], male    DOB: 20-Apr-1946, 69 y.o.   MRN: [id number]  HPI  Here for yearly f/u;  Overall doing ok;  Pt denies Chest pain, worsening SOB, DOE, wheezing, orthopnea, PND, worsening LE edema, palpitations, dizziness or syncope.  Pt denies neurological change such as new headache, facial or extremity weakness.  Pt denies polydipsia, polyuria, or low sugar symptoms. Pt states overall good compliance with treatment and medications, good tolerability, and has been trying to follow appropriate diet.  Pt denies worsening depressive symptoms, suicidal ideation or panic. No fever, night sweats, wt loss, loss of appetite, or other constitutional symptoms.  Pt states good ability with ADL's, has low fall risk, home safety reviewed and adequate, no other significant changes in hearing or vision, and only occasionally active with exercise. Has not taken BP meds in1 yr.  Has had mild persitent elev BP, but daughter is NP and urged him to re-start.  Does not want to re-start the flomax due to dry mouth. Denies urinary symptoms such as dysuria, frequency, urgency, flank pain, hematuria or n/v, fever, chills.  Saw urology 1 mo ago, PSA just over 2, saw Dr Janice Norrie who recently retired but pt has /fu in 78 mo with new urology. Past Medical History  Diagnosis Date  . ALLERGIC RHINITIS 05/04/2007  . BACK PAIN, LUMBAR 02/17/2010  . CHEST PAIN 07/15/2009  . CONSTIPATION 02/17/2010  . HYPERLIPIDEMIA 05/04/2007  . HYPERTENSION 05/04/2007  . NECK PAIN, ACUTE 07/15/2009  . PSVT 05/04/2007  . VERTIGO 05/04/2007  . Lumbar disc disease 10/01/2011  . Heart murmur     remote hx of murmur  . Prostate enlargement   . Spermatocele   . Interstitial cystitis 08/30/2013  . Skin cancer 08/30/2013    Basal cell x 2, dec 2014   Past Surgical History  Procedure Laterality Date  . S/p rk surgery bilat    . Appendectomy    . Tonsillectomy    . S/p dental implant left upper jaw  11/09    also rt  lower jaw  . Knee arthroscopy  30 yrs ago  . Spermatocelectomy  12/04/2011    Procedure: SPERMATOCELECTOMY;  Surgeon: Hanley Ben, MD;  Location: [facility];  Service: Urology;  Laterality: Left;  1 hour requested for this case     reports that he has never smoked. He does not have any smokeless tobacco history on file. He reports that he does not drink alcohol or use illicit drugs. family history includes Cancer in his mother; Dementia in his mother; Diabetes in his other; Heart disease in his other; Hyperlipidemia in his other; Hypertension in his mother. Allergies  Allergen Reactions  . Celebrex [Celecoxib] Anaphylaxis    Oral sores  . Cyclobenzaprine Hcl     REACTION: faical skin rash, 'hard cysts'  . Mobic [Meloxicam] Other (See Comments)    Sores in mouth   Current Outpatient Prescriptions on File Prior to Visit  Medication Sig Dispense Refill  . aspirin 81 MG EC tablet Take 81 mg by mouth daily.      . Probiotic Product (PROBIOTIC PO) Take by mouth daily.      . tamsulosin (FLOMAX) 0.4 MG CAPS capsule Take 0.4 mg by mouth daily.     No current facility-administered medications on file prior to visit.   Review of Systems Constitutional: Negative for increased diaphoresis, other activity, appetite or siginficant weight change other than noted HENT: Negative for worsening  hearing loss, ear pain, facial swelling, mouth sores and neck stiffness.   Eyes: Negative for other worsening pain, redness or visual disturbance.  Respiratory: Negative for shortness of breath and wheezing  Cardiovascular: Negative for chest pain and palpitations.  Gastrointestinal: Negative for diarrhea, blood in stool, abdominal distention or other pain Genitourinary: Negative for hematuria, flank pain or change in urine volume.  Musculoskeletal: Negative for myalgias or other joint complaints.  Skin: Negative for color change and wound or drainage.  Neurological: Negative for syncope and  numbness. other than noted Hematological: Negative for adenopathy. or other swelling Psychiatric/Behavioral: Negative for hallucinations, SI, self-injury, decreased concentration or other worsening agitation.      Objective:   Physical Exam BP 160/80 mmHg  Pulse 75  Temp(Src) 98 F (36.7 C) (Oral)  Ht 6' 2.5" (1.892 m)  Wt 226 lb 8 oz (102.74 kg)  BMI 28.70 kg/m2  SpO2 95% VS noted,  Constitutional: Pt is oriented to person, place, and time. Appears well-developed and well-nourished, in no significant distress Head: Normocephalic and atraumatic.  Right Ear: External ear normal.  Left Ear: External ear normal.  Nose: Nose normal.  Mouth/Throat: Oropharynx is clear and moist.  Eyes: Conjunctivae and EOM are normal. Pupils are equal, round, and reactive to light.  Neck: Normal range of motion. Neck supple. No JVD present. No tracheal deviation present or significant neck LA or mass Cardiovascular: Normal rate, regular rhythm, normal heart sounds and intact distal pulses.   Pulmonary/Chest: Effort normal and breath sounds without rales or wheezing  Abdominal: Soft. Bowel sounds are normal. NT. No HSM  Musculoskeletal: Normal range of motion. Exhibits no edema.  Lymphadenopathy:  Has no cervical adenopathy.  Neurological: Pt is alert and oriented to person, place, and time. Pt has normal reflexes. No cranial nerve deficit. Motor grossly intact Skin: Skin is warm and dry. No rash noted.  Psychiatric:  Has normal mood and affect. Behavior is normal.     Assessment & Plan:

## 2015-02-26 NOTE — Patient Instructions (Addendum)
Please continue all other medications as before, and refills have been done if requested.  Please have the pharmacy call with any other refills you may need.  Please continue your efforts at being more active, low cholesterol diet, and weight control.  You are otherwise up to date with prevention measures today.  Please keep your appointments with your specialists as you may have planned  Please go to the LAB in the Basement (turn left off the elevator) for the tests to be done today  You will be contacted by phone if any changes need to be made immediately.  Otherwise, you will receive a letter about your results with an explanation, but please check with MyChart first.  Please return in 6 months, or sooner if needed 

## 2015-02-26 NOTE — Progress Notes (Signed)
Pre visit review using our clinic review tool, if applicable. No additional management support is needed unless otherwise documented below in the visit note. 

## 2015-02-26 NOTE — Assessment & Plan Note (Signed)
Elevated, to restart meds, o/w stable overall by history and exam, recent data reviewed with pt, and pt to continue medical treatment as before,  to f/u any worsening symptoms or concerns BP Readings from Last 3 Encounters:  02/26/15 160/80  02/28/14 110/78  08/30/13 142/90

## 2015-02-27 ENCOUNTER — Encounter: Payer: Self-pay | Admitting: Internal Medicine

## 2015-04-04 DIAGNOSIS — S92911A Unspecified fracture of right toe(s), initial encounter for closed fracture: Secondary | ICD-10-CM | POA: Diagnosis not present

## 2015-05-09 DIAGNOSIS — D2262 Melanocytic nevi of left upper limb, including shoulder: Secondary | ICD-10-CM | POA: Diagnosis not present

## 2015-05-09 DIAGNOSIS — L570 Actinic keratosis: Secondary | ICD-10-CM | POA: Diagnosis not present

## 2015-05-09 DIAGNOSIS — L918 Other hypertrophic disorders of the skin: Secondary | ICD-10-CM | POA: Diagnosis not present

## 2015-05-09 DIAGNOSIS — Z85828 Personal history of other malignant neoplasm of skin: Secondary | ICD-10-CM | POA: Diagnosis not present

## 2015-05-09 DIAGNOSIS — D2261 Melanocytic nevi of right upper limb, including shoulder: Secondary | ICD-10-CM | POA: Diagnosis not present

## 2015-05-09 DIAGNOSIS — D225 Melanocytic nevi of trunk: Secondary | ICD-10-CM | POA: Diagnosis not present

## 2015-05-09 DIAGNOSIS — L821 Other seborrheic keratosis: Secondary | ICD-10-CM | POA: Diagnosis not present

## 2015-05-09 DIAGNOSIS — D1801 Hemangioma of skin and subcutaneous tissue: Secondary | ICD-10-CM | POA: Diagnosis not present

## 2015-08-28 ENCOUNTER — Other Ambulatory Visit: Payer: Self-pay | Admitting: Internal Medicine

## 2015-08-28 NOTE — Telephone Encounter (Signed)
Please advise 

## 2015-08-28 NOTE — Telephone Encounter (Signed)
Medication sent to pharmacy  

## 2015-08-28 NOTE — Telephone Encounter (Signed)
Done hardcopy to Corinne  

## 2015-08-29 ENCOUNTER — Telehealth: Payer: Self-pay

## 2015-08-29 ENCOUNTER — Ambulatory Visit (INDEPENDENT_AMBULATORY_CARE_PROVIDER_SITE_OTHER): Payer: Medicare Other

## 2015-08-29 VITALS — BP 136/86 | HR 66 | Ht 74.0 in | Wt 223.0 lb

## 2015-08-29 DIAGNOSIS — Z23 Encounter for immunization: Secondary | ICD-10-CM | POA: Diagnosis not present

## 2015-08-29 DIAGNOSIS — Z0000 Encounter for general adult medical examination without abnormal findings: Secondary | ICD-10-CM

## 2015-08-29 NOTE — Telephone Encounter (Signed)
Too early for new rx as this was just done yesterday, thanks

## 2015-08-29 NOTE — Patient Instructions (Addendum)
Troy [name] , Thank you for taking time to come for your Medicare Wellness Visit. I appreciate your ongoing commitment to your health goals. Please review the following plan we discussed and let me know if I can assist you in the future.   Will take your PSV 23 today; this is your last pneumonia vaccine. Needs Rx; tramadol; we will  fax tramadol to pharmacy after Dr. Jenny Reichmann approves.  These are the goals we discussed: Goals    . Exercise 150 minutes per week (moderate activity)     Wants to increase exercise;  Play golf 3 times; take one afternoon; walk at least 1 hour;        This is a list of the screening recommended for you and due dates:  Health Maintenance  Topic Date Due  . Pneumonia vaccines (2 of 2 - PPSV23) 08/31/2014  . Flu Shot  12/03/2015  . Colon Cancer Screening  01/24/2018  . Tetanus Vaccine  04/10/2018  . Shingles Vaccine  Completed  .  Hepatitis C: One time screening is recommended by Center for Disease Control  (CDC) for  adults born from [birth year] through 1965.   Completed     Fall Prevention in the Home  Falls can cause injuries. They can happen to people of all ages. There are many things you can do to make your home safe and to help prevent falls.  WHAT CAN I DO ON THE OUTSIDE OF MY HOME?  Regularly fix the edges of walkways and driveways and fix any cracks.  Remove anything that might make you trip as you walk through a door, such as a raised step or threshold.  Trim any bushes or trees on the path to your home.  Use bright outdoor lighting.  Clear any walking paths of anything that might make someone trip, such as rocks or tools.  Regularly check to see if handrails are loose or broken. Make sure that both sides of any steps have handrails.  Any raised decks and porches should have guardrails on the edges.  Have any leaves, snow, or ice cleared regularly.  Use sand or salt on walking paths during winter.  Clean up any spills in your garage right  away. This includes oil or grease spills. WHAT CAN I DO IN THE BATHROOM?   Use night lights.  Install grab bars by the toilet and in the tub and shower. Do not use towel bars as grab bars.  Use non-skid mats or decals in the tub or shower.  If you need to sit down in the shower, use a plastic, non-slip stool.  Keep the floor dry. Clean up any water that spills on the floor as soon as it happens.  Remove soap buildup in the tub or shower regularly.  Attach bath mats securely with double-sided non-slip rug tape.  Do not have throw rugs and other things on the floor that can make you trip. WHAT CAN I DO IN THE BEDROOM?  Use night lights.  Make sure that you have a light by your bed that is easy to reach.  Do not use any sheets or blankets that are too big for your bed. They should not hang down onto the floor.  Have a firm chair that has side arms. You can use this for support while you get dressed.  Do not have throw rugs and other things on the floor that can make you trip. WHAT CAN I DO IN THE KITCHEN?  Clean up any  spills right away.  Avoid walking on wet floors.  Keep items that you use a lot in easy-to-reach places.  If you need to reach something above you, use a strong step stool that has a grab bar.  Keep electrical cords out of the way.  Do not use floor polish or wax that makes floors slippery. If you must use wax, use non-skid floor wax.  Do not have throw rugs and other things on the floor that can make you trip. WHAT CAN I DO WITH MY STAIRS?  Do not leave any items on the stairs.  Make sure that there are handrails on both sides of the stairs and use them. Fix handrails that are broken or loose. Make sure that handrails are as long as the stairways.  Check any carpeting to make sure that it is firmly attached to the stairs. Fix any carpet that is loose or worn.  Avoid having throw rugs at the top or bottom of the stairs. If you do have throw rugs, attach  them to the floor with carpet tape.  Make sure that you have a light switch at the top of the stairs and the bottom of the stairs. If you do not have them, ask someone to add them for you. WHAT ELSE CAN I DO TO HELP PREVENT FALLS?  Wear shoes that:  Do not have high heels.  Have rubber bottoms.  Are comfortable and fit you well.  Are closed at the toe. Do not wear sandals.  If you use a stepladder:  Make sure that it is fully opened. Do not climb a closed stepladder.  Make sure that both sides of the stepladder are locked into place.  Ask someone to hold it for you, if possible.  Clearly mark and make sure that you can see:  Any grab bars or handrails.  First and last steps.  Where the edge of each step is.  Use tools that help you move around (mobility aids) if they are needed. These include:  Canes.  Walkers.  Scooters.  Crutches.  Turn on the lights when you go into a dark area. Replace any light bulbs as soon as they burn out.  Set up your furniture so you have a clear path. Avoid moving your furniture around.  If any of your floors are uneven, fix them.  If there are any pets around you, be aware of where they are.  Review your medicines with your doctor. Some medicines can make you feel dizzy. This can increase your chance of falling. Ask your doctor what other things that you can do to help prevent falls.   This information is not intended to replace advice given to you by your health care provider. Make sure you discuss any questions you have with your health care provider.   Document Released: 02/14/2009 Document Revised: 09/04/2014 Document Reviewed: 05/25/2014 Elsevier Interactive Patient Education 2016 [place] Maintenance, Male A healthy lifestyle and preventative care can promote health and wellness.  Maintain regular health, dental, and eye exams.  Eat a healthy diet. Foods like vegetables, fruits, whole grains, low-fat dairy  products, and lean protein foods contain the nutrients you need and are low in calories. Decrease your intake of foods high in solid fats, added sugars, and salt. Get information about a proper diet from your health care provider, if necessary.  Regular physical exercise is one of the most important things you can do for your health. Most adults should get  at least 150 minutes of moderate-intensity exercise (any activity that increases your heart rate and causes you to sweat) each week. In addition, most adults need muscle-strengthening exercises on 2 or more days a week.   Maintain a healthy weight. The body mass index (BMI) is a screening tool to identify possible weight problems. It provides an estimate of body fat based on height and weight. Your health care provider can find your BMI and can help you achieve or maintain a healthy weight. For males 20 years and older:  A BMI below 18.5 is considered underweight.  A BMI of 18.5 to 24.9 is normal.  A BMI of 25 to 29.9 is considered overweight.  A BMI of 30 and above is considered obese.  Maintain normal blood lipids and cholesterol by exercising and minimizing your intake of saturated fat. Eat a balanced diet with plenty of fruits and vegetables. Blood tests for lipids and cholesterol should begin at age 28 and be repeated every 5 years. If your lipid or cholesterol levels are high, you are over age 96, or you are at high risk for heart disease, you may need your cholesterol levels checked more frequently.Ongoing high lipid and cholesterol levels should be treated with medicines if diet and exercise are not working.  If you smoke, find out from your health care provider how to quit. If you do not use tobacco, do not start.  Lung cancer screening is recommended for adults aged 64-80 years who are at high risk for developing lung cancer because of a history of smoking. A yearly low-dose CT scan of the lungs is recommended for people who have at  least a 30-pack-year history of smoking and are current smokers or have quit within the past 15 years. A pack year of smoking is smoking an average of 1 pack of cigarettes a day for 1 year (for example, a 30-pack-year history of smoking could mean smoking 1 pack a day for 30 years or 2 packs a day for 15 years). Yearly screening should continue until the smoker has stopped smoking for at least 15 years. Yearly screening should be stopped for people who develop a health problem that would prevent them from having lung cancer treatment.  If you choose to drink alcohol, do not have more than 2 drinks per day. One drink is considered to be 12 oz (360 mL) of beer, 5 oz (150 mL) of wine, or 1.5 oz (45 mL) of liquor.  Avoid the use of street drugs. Do not share needles with anyone. Ask for help if you need support or instructions about stopping the use of drugs.  High blood pressure causes heart disease and increases the risk of stroke. High blood pressure is more likely to develop in:  People who have blood pressure in the end of the normal range (100-139/85-89 mm Hg).  People who are overweight or obese.  People who are African American.  If you are 59-20 years of age, have your blood pressure checked every 3-5 years. If you are 35 years of age or older, have your blood pressure checked every year. You should have your blood pressure measured twice--once when you are at a hospital or clinic, and once when you are not at a hospital or clinic. Record the average of the two measurements. To check your blood pressure when you are not at a hospital or clinic, you can use:  An automated blood pressure machine at a pharmacy.  A home blood pressure monitor.  If you are 63-62 years old, ask your health care provider if you should take aspirin to prevent heart disease.  Diabetes screening involves taking a blood sample to check your fasting blood sugar level. This should be done once every 3 years after age  63 if you are at a normal weight and without risk factors for diabetes. Testing should be considered at a younger age or be carried out more frequently if you are overweight and have at least 1 risk factor for diabetes.  Colorectal cancer can be detected and often prevented. Most routine colorectal cancer screening begins at the age of 34 and continues through age 71. However, your health care provider may recommend screening at an earlier age if you have risk factors for colon cancer. On a yearly basis, your health care provider may provide home test kits to check for hidden blood in the stool. A small camera at the end of a tube may be used to directly examine the colon (sigmoidoscopy or colonoscopy) to detect the earliest forms of colorectal cancer. Talk to your health care provider about this at age 81 when routine screening begins. A direct exam of the colon should be repeated every 5-10 years through age 77, unless early forms of precancerous polyps or small growths are found.  People who are at an increased risk for hepatitis B should be screened for this virus. You are considered at high risk for hepatitis B if:  You were born in a country where hepatitis B occurs often. Talk with your health care provider about which countries are considered high risk.  Your parents were born in a high-risk country and you have not received a shot to protect against hepatitis B (hepatitis B vaccine).  You have HIV or AIDS.  You use needles to inject street drugs.  You live with, or have sex with, someone who has hepatitis B.  You are a man who has sex with other men (MSM).  You get hemodialysis treatment.  You take certain medicines for conditions like cancer, organ transplantation, and autoimmune conditions.  Hepatitis C blood testing is recommended for all people born from [birth year] through 1965 and any individual with known risk factors for hepatitis C.  Healthy men should no longer receive  prostate-specific antigen (PSA) blood tests as part of routine cancer screening. Talk to your health care provider about prostate cancer screening.  Testicular cancer screening is not recommended for adolescents or adult males who have no symptoms. Screening includes self-exam, a health care provider exam, and other screening tests. Consult with your health care provider about any symptoms you have or any concerns you have about testicular cancer.  Practice safe sex. Use condoms and avoid high-risk sexual practices to reduce the spread of sexually transmitted infections (STIs).  You should be screened for STIs, including gonorrhea and chlamydia if:  You are sexually active and are younger than 24 years.  You are older than 24 years, and your health care provider tells you that you are at risk for this type of infection.  Your sexual activity has changed since you were last screened, and you are at an increased risk for chlamydia or gonorrhea. Ask your health care provider if you are at risk.  If you are at risk of being infected with HIV, it is recommended that you take a prescription medicine daily to prevent HIV infection. This is called pre-exposure prophylaxis (PrEP). You are considered at risk if:  You are a man who has  sex with other men (MSM).  You are a heterosexual man who is sexually active with multiple partners.  You take drugs by injection.  You are sexually active with a partner who has HIV.  Talk with your health care provider about whether you are at high risk of being infected with HIV. If you choose to begin PrEP, you should first be tested for HIV. You should then be tested every 3 months for as long as you are taking PrEP.  Use sunscreen. Apply sunscreen liberally and repeatedly throughout the day. You should seek shade when your shadow is shorter than you. Protect yourself by wearing long sleeves, pants, a wide-brimmed hat, and sunglasses year round whenever you are  outdoors.  Tell your health care provider of new moles or changes in moles, especially if there is a change in shape or color. Also, tell your health care provider if a mole is larger than the size of a pencil eraser.  A one-time screening for abdominal aortic aneurysm (AAA) and surgical repair of large AAAs by ultrasound is recommended for men aged 26-75 years who are current or former smokers.  Stay current with your vaccines (immunizations).   This information is not intended to replace advice given to you by your health care provider. Make sure you discuss any questions you have with your health care provider.   Document Released: 10/17/2007 Document Revised: 05/11/2014 Document Reviewed: 09/15/2010 Elsevier Interactive Patient Education [employer].

## 2015-08-29 NOTE — Telephone Encounter (Signed)
AWV today and the patient requested refill of Tramadol as previously rx.  Please advise. Tks

## 2015-08-29 NOTE — Progress Notes (Addendum)
Subjective:   Troy [name] is a 70 y.o. male who presents for Medicare Annual/Subsequent preventive examination.  Review of Systems:   HRA assessment completed during visit; [place]  The Patient was informed that this wellness visit is to identify risk and educate on how to reduce risk for increase disease through lifestyle changes.   ROS deferred to CPE exam with physician  Family and medical hx given below;  Mother had cancer; dementia; htn  Lifestyle review:  HTN: medically managed  PSVT; one episode of tachycardia; started on beta blocker; no issues since Hyperlipidemia 04/2015; chol 201; Trig 438; HDL 44; LDL 101 (glucose 113)  Discussed Triglyceride; Stated he was eating bacon and sausage and has stopped this. Also discuss diet and will try to monitor fat intake; Was drinking coconut milk but has stopped.  Tobacco never smoked no Secondary smoke? no ETOH: No drinking   Has one Dtr; NP in [place]  Pain; vericocele;  Happens when he lies down  Medication review/ Adherent; can take anything; not concerned with given allergies  BMI: 28 Diet; on paleo diet post retirement; lost weight  Breakfast; dry whole wheat toast with chicken breast / tenderloin  Eggs over medium  Lunch; vegetables; [employer];  Eats out all the time;   Supper; microwave; Japanese meal; grilled chicken and rice    Exercise;  Plays golf x 2 a week; count steps; 14000 steps Walk 6am the dogs; 3 to 4 times a day;  Exercise in am per PT; leg lifts and core work; arm stretches Lift weights every day; 35 lb curls; 15 each arm Nothing over shoulders per ortho Modified push up 45 q am;  Walks dogs   HOME SAFETY; multi level home;  Plans to move to one level closer to dtr in [place];  Hurts him to walk downstairs; plantar fasciitis; doing appropriate exercises and icing   Bathroom safety; Shower in tub or free standing? Had sister that was developmentally d/a and took care of her and  understands accessibility  Community safety; yes Smoke detectors yes Firearms safety reviewed and will keep in a safe place if these exist. Driving accidents; no; Wears a seatbelt Advised to use sun protection; had skin cancers x 3; wear sun protection x 2 per day   Stressors (1-5) not much stress   Depression: Denies feeling depressed or hopeless; voices pleasure in daily life Mood stable;   Cognitive; Presents with no issues;  Engaged in assessment Manages checkbook, medications; no failures of task Ad8 score reviewed for issues;  . Issues making decisions; no . Less interest in hobbies / activities: no . Repeats questions, stories; family complaining:no . Trouble using ordinary gadgets; microwave; computer: no . Forgets the month or year:no . Mismanaging finances; NO . Missing apt: No . Daily problems with thinking of memory: no  Fall assessment; no;  Fell X 70 yo stocking feet slipped  and fell down first 6 stairs Does not climb ladders; episodes of vertigo;  Gait assessment appears normal;   Mobilization and Functional losses from last year to this year?; trying to get more functional next year;  Notes that is is more difficult to put on pants; or get up from the floor without using arms  Sleep pattern changes; no;  Frequency; don't drink tea after MN; one hour nap during the day and nap during the evening; goes to bed around 10 or 11am   Urinary or fecal incontinence reviewed: urinary frequency;    Counseling Health  Maintenance Colonoscopy; 01/2008; repeat 10 years 01/2018 EKG: 12/2011  Hearing: 4000 hz on right 2000hz  left   Ophthalmology exam; every year; Dr. Herbert Deaner; Dry eyes; restasis   Immunizations Due; PSV at 58; PSV 23 due again; Agreed to take today  Advanced Directive; given a packet  Health Recommendations and Referrals Will take your PSV 23 today; this is your last pneumonia vaccine. Needs Rx; tramadol; we will  fax tramadol to pharmacy after Dr. Jenny Reichmann  approves. Less fat in food choices    Current Care Team reviewed and updated Urologist q 12 months; Nesi;   Cardiac Risk Factors include: advanced age (>49men, >26 women);dyslipidemia;family history of premature cardiovascular disease;male gender     Objective:    Vitals: BP 136/86 mmHg  Pulse 66  Ht 6\' 2"  (1.88 m)  Wt 223 lb (101.152 kg)  BMI 28.62 kg/m2  SpO2 93%  Body mass index is 28.62 kg/(m^2).  Tobacco History  Smoking status  . Never Smoker   Smokeless tobacco  . Not on file     Counseling given: Yes   Past Medical History  Diagnosis Date  . ALLERGIC RHINITIS 05/04/2007  . BACK PAIN, LUMBAR 02/17/2010  . CHEST PAIN 07/15/2009  . CONSTIPATION 02/17/2010  . HYPERLIPIDEMIA 05/04/2007  . HYPERTENSION 05/04/2007  . NECK PAIN, ACUTE 07/15/2009  . PSVT 05/04/2007  . VERTIGO 05/04/2007  . Lumbar disc disease 10/01/2011  . Heart murmur     remote hx of murmur  . Prostate enlargement   . Spermatocele   . Interstitial cystitis 08/30/2013  . Skin cancer 08/30/2013    Basal cell x 2, dec 2014   Past Surgical History  Procedure Laterality Date  . S/p rk surgery bilat    . Appendectomy    . Tonsillectomy    . S/p dental implant left upper jaw  11/09    also rt lower jaw  . Knee arthroscopy  30 yrs ago  . Spermatocelectomy  12/04/2011    Procedure: SPERMATOCELECTOMY;  Surgeon: Hanley Ben, MD;  Location: [facility];  Service: Urology;  Laterality: Left;  1 hour requested for this case    Family History  Problem Relation Age of Onset  . Cancer Mother     breast cancer  . Dementia Mother   . Hypertension Mother   . Hyperlipidemia Other   . Diabetes Other   . Heart disease Other    History  Sexual Activity  . Sexual Activity: Not on file    Outpatient Encounter Prescriptions as of 08/29/2015  Medication Sig  . aspirin 81 MG EC tablet Take 81 mg by mouth daily.    Marland Kitchen atenolol (TENORMIN) 50 MG tablet Take 1 tablet (50 mg total) by mouth  daily.  Marland Kitchen atorvastatin (LIPITOR) 40 MG tablet Take 1 tablet (40 mg total) by mouth daily at 6 PM.  . fluticasone (FLONASE) 50 MCG/ACT nasal spray Place 2 sprays into both nostrils daily.  Marland Kitchen losartan (COZAAR) 100 MG tablet Take 1 tablet (100 mg total) by mouth daily.  . montelukast (SINGULAIR) 10 MG tablet Take 1 tablet (10 mg total) by mouth daily.  . Probiotic Product (PROBIOTIC PO) Take by mouth daily.    . traMADol (ULTRAM) 50 MG tablet TAKE ONE TABLET BY MOUTH EVERY 6 HOURS AS NEEDED FOR SEVERE PAIN   No facility-administered encounter medications on file as of 08/29/2015.    Activities of Daily Living In your present state of health, do you have any difficulty performing the following  activities: 08/29/2015  Hearing? N  Vision? N  Difficulty concentrating or making decisions? N  Walking or climbing stairs? N  Dressing or bathing? N  Doing errands, shopping? N  Preparing Food and eating ? N  Using the Toilet? N  In the past six months, have you accidently leaked urine? (No Data)  Do you have problems with loss of bowel control? N  Managing your Medications? N  Managing your Finances? N  Housekeeping or managing your Housekeeping? N    Patient Care Team: Biagio Borg, MD as PCP - General   Assessment:     Exercise Activities and Dietary recommendations Current Exercise Habits: Home exercise routine, Type of exercise: walking;strength training/weights, Time (Minutes): > 60, Frequency (Times/Week): 4, Weekly Exercise (Minutes/Week): 0, Intensity: Moderate  Goals    . Exercise 150 minutes per week (moderate activity)     Wants to increase exercise;  Play golf 3 times; take one afternoon; walk at least 1 hour;       Fall Risk Fall Risk  08/29/2015 02/26/2015 08/30/2013  Falls in the past year? No No No  Number falls in past yr: - 1 -  Injury with Fall? - No -   Depression Screen PHQ 2/9 Scores 08/29/2015 02/26/2015 08/30/2013  PHQ - 2 Score 0 0 0    Cognitive  Testing No flowsheet data found.  Immunization History  Administered Date(s) Administered  . H1N1 04/10/2008  . Influenza Whole 05/04/2007, 03/08/2009, 02/17/2010, 01/07/2011  . Influenza, High Dose Seasonal PF 01/29/2014  . Pneumococcal Conjugate-13 08/30/2013  . Pneumococcal Polysaccharide-23 11/25/2006, 08/29/2015  . Td 04/10/2008  . Zoster 04/10/2008   Screening Tests Health Maintenance  Topic Date Due  . PNA vac Low Risk Adult (2 of 2 - PPSV23) 08/31/2014  . INFLUENZA VACCINE  12/03/2015  . COLONOSCOPY  01/24/2018  . TETANUS/TDAP  04/10/2018  . ZOSTAVAX  Completed  . Hepatitis C Screening  Completed      Plan:     Will take your PSV 23 today; this is your last pneumonia vaccine. Needs Rx; tramadol; we will  fax tramadol to pharmacy after Dr. Jenny Reichmann approves.  During the course of the visit the patient was educated and counseled about the following appropriate screening and preventive services:   Vaccines to include Pneumoccal, Influenza, Hepatitis B, Td, Zostavax, HCV  Took PSV 23 today  Electrocardiogram / 12/2011  Cardiovascular Disease/ bP good; diet good; discussed trig   Colorectal cancer screening/ repeat 01/2018  Diabetes screening/ fasting elevated 113  (if fasting)    Prostate Cancer Screening/ see UR  Glaucoma screening/ eye exam annually; Dr. Herbert Deaner  Nutrition counseling / discussed; wants to stay fit and add one round of golf and 1 hour of walking to his week.   Smoking cessation counseling; neg  Patient Instructions (the written plan) was given to the patient.    Wynetta Fines, RN  08/29/2015  Medical screening examination/treatment/procedure(s) were performed by non-physician practitioner and as supervising physician I was immediately available for consultation/collaboration. I agree with above. Cathlean Cower, MD   Medical screening examination/treatment/procedure(s) were performed by non-physician practitioner and as supervising physician I was  immediately available for consultation/collaboration. I agree with above. Cathlean Cower, MD

## 2015-09-01 ENCOUNTER — Other Ambulatory Visit: Payer: Self-pay | Admitting: Internal Medicine

## 2015-09-02 NOTE — Telephone Encounter (Signed)
Please advise 

## 2015-09-03 NOTE — Telephone Encounter (Signed)
Tramadol too soon since last rx for 4 mo was done apr 27

## 2015-10-14 ENCOUNTER — Other Ambulatory Visit: Payer: Self-pay | Admitting: Internal Medicine

## 2015-12-27 DIAGNOSIS — N401 Enlarged prostate with lower urinary tract symptoms: Secondary | ICD-10-CM | POA: Diagnosis not present

## 2015-12-27 DIAGNOSIS — R351 Nocturia: Secondary | ICD-10-CM | POA: Diagnosis not present

## 2016-01-13 DIAGNOSIS — R351 Nocturia: Secondary | ICD-10-CM | POA: Diagnosis not present

## 2016-01-13 DIAGNOSIS — K5900 Constipation, unspecified: Secondary | ICD-10-CM | POA: Diagnosis not present

## 2016-01-13 DIAGNOSIS — R278 Other lack of coordination: Secondary | ICD-10-CM | POA: Diagnosis not present

## 2016-01-13 DIAGNOSIS — R350 Frequency of micturition: Secondary | ICD-10-CM | POA: Diagnosis not present

## 2016-01-13 DIAGNOSIS — M62838 Other muscle spasm: Secondary | ICD-10-CM | POA: Diagnosis not present

## 2016-01-22 DIAGNOSIS — R351 Nocturia: Secondary | ICD-10-CM | POA: Diagnosis not present

## 2016-01-22 DIAGNOSIS — M62838 Other muscle spasm: Secondary | ICD-10-CM | POA: Diagnosis not present

## 2016-01-22 DIAGNOSIS — K5900 Constipation, unspecified: Secondary | ICD-10-CM | POA: Diagnosis not present

## 2016-01-22 DIAGNOSIS — M6281 Muscle weakness (generalized): Secondary | ICD-10-CM | POA: Diagnosis not present

## 2016-01-22 DIAGNOSIS — R350 Frequency of micturition: Secondary | ICD-10-CM | POA: Diagnosis not present

## 2016-01-22 DIAGNOSIS — R278 Other lack of coordination: Secondary | ICD-10-CM | POA: Diagnosis not present

## 2016-02-10 ENCOUNTER — Other Ambulatory Visit: Payer: Self-pay | Admitting: Internal Medicine

## 2016-02-11 NOTE — Telephone Encounter (Signed)
faxed

## 2016-02-11 NOTE — Telephone Encounter (Signed)
Done hardcopy to Corinne  Please ask pt to make yearly f/u visit

## 2016-02-24 DIAGNOSIS — M6281 Muscle weakness (generalized): Secondary | ICD-10-CM | POA: Diagnosis not present

## 2016-02-24 DIAGNOSIS — K5900 Constipation, unspecified: Secondary | ICD-10-CM | POA: Diagnosis not present

## 2016-02-24 DIAGNOSIS — R351 Nocturia: Secondary | ICD-10-CM | POA: Diagnosis not present

## 2016-02-24 DIAGNOSIS — R350 Frequency of micturition: Secondary | ICD-10-CM | POA: Diagnosis not present

## 2016-02-24 DIAGNOSIS — R278 Other lack of coordination: Secondary | ICD-10-CM | POA: Diagnosis not present

## 2016-02-24 DIAGNOSIS — M62838 Other muscle spasm: Secondary | ICD-10-CM | POA: Diagnosis not present

## 2016-02-25 DIAGNOSIS — R972 Elevated prostate specific antigen [PSA]: Secondary | ICD-10-CM | POA: Diagnosis not present

## 2016-02-26 DIAGNOSIS — Z23 Encounter for immunization: Secondary | ICD-10-CM | POA: Diagnosis not present

## 2016-03-02 DIAGNOSIS — K5900 Constipation, unspecified: Secondary | ICD-10-CM | POA: Diagnosis not present

## 2016-03-02 DIAGNOSIS — M6281 Muscle weakness (generalized): Secondary | ICD-10-CM | POA: Diagnosis not present

## 2016-03-02 DIAGNOSIS — R350 Frequency of micturition: Secondary | ICD-10-CM | POA: Diagnosis not present

## 2016-03-02 DIAGNOSIS — M62838 Other muscle spasm: Secondary | ICD-10-CM | POA: Diagnosis not present

## 2016-03-02 DIAGNOSIS — R278 Other lack of coordination: Secondary | ICD-10-CM | POA: Diagnosis not present

## 2016-03-02 DIAGNOSIS — R351 Nocturia: Secondary | ICD-10-CM | POA: Diagnosis not present

## 2016-03-09 DIAGNOSIS — M62838 Other muscle spasm: Secondary | ICD-10-CM | POA: Diagnosis not present

## 2016-03-09 DIAGNOSIS — M6281 Muscle weakness (generalized): Secondary | ICD-10-CM | POA: Diagnosis not present

## 2016-03-09 DIAGNOSIS — R351 Nocturia: Secondary | ICD-10-CM | POA: Diagnosis not present

## 2016-03-09 DIAGNOSIS — K5900 Constipation, unspecified: Secondary | ICD-10-CM | POA: Diagnosis not present

## 2016-03-09 DIAGNOSIS — R278 Other lack of coordination: Secondary | ICD-10-CM | POA: Diagnosis not present

## 2016-03-09 DIAGNOSIS — R350 Frequency of micturition: Secondary | ICD-10-CM | POA: Diagnosis not present

## 2016-03-19 ENCOUNTER — Other Ambulatory Visit: Payer: Self-pay | Admitting: Internal Medicine

## 2016-03-23 DIAGNOSIS — R351 Nocturia: Secondary | ICD-10-CM | POA: Diagnosis not present

## 2016-03-23 DIAGNOSIS — R350 Frequency of micturition: Secondary | ICD-10-CM | POA: Diagnosis not present

## 2016-03-23 DIAGNOSIS — R278 Other lack of coordination: Secondary | ICD-10-CM | POA: Diagnosis not present

## 2016-03-23 DIAGNOSIS — M62838 Other muscle spasm: Secondary | ICD-10-CM | POA: Diagnosis not present

## 2016-03-23 DIAGNOSIS — M6281 Muscle weakness (generalized): Secondary | ICD-10-CM | POA: Diagnosis not present

## 2016-03-23 DIAGNOSIS — K5900 Constipation, unspecified: Secondary | ICD-10-CM | POA: Diagnosis not present

## 2016-04-03 DIAGNOSIS — M791 Myalgia: Secondary | ICD-10-CM | POA: Diagnosis not present

## 2016-04-03 DIAGNOSIS — M722 Plantar fascial fibromatosis: Secondary | ICD-10-CM | POA: Diagnosis not present

## 2016-04-06 DIAGNOSIS — M62838 Other muscle spasm: Secondary | ICD-10-CM | POA: Diagnosis not present

## 2016-04-06 DIAGNOSIS — M545 Low back pain: Secondary | ICD-10-CM | POA: Diagnosis not present

## 2016-04-06 DIAGNOSIS — M6281 Muscle weakness (generalized): Secondary | ICD-10-CM | POA: Diagnosis not present

## 2016-04-06 DIAGNOSIS — R278 Other lack of coordination: Secondary | ICD-10-CM | POA: Diagnosis not present

## 2016-04-06 DIAGNOSIS — K5900 Constipation, unspecified: Secondary | ICD-10-CM | POA: Diagnosis not present

## 2016-04-06 DIAGNOSIS — R351 Nocturia: Secondary | ICD-10-CM | POA: Diagnosis not present

## 2016-04-13 DIAGNOSIS — R351 Nocturia: Secondary | ICD-10-CM | POA: Diagnosis not present

## 2016-04-13 DIAGNOSIS — N50819 Testicular pain, unspecified: Secondary | ICD-10-CM | POA: Diagnosis not present

## 2016-04-13 DIAGNOSIS — M6281 Muscle weakness (generalized): Secondary | ICD-10-CM | POA: Diagnosis not present

## 2016-04-13 DIAGNOSIS — R350 Frequency of micturition: Secondary | ICD-10-CM | POA: Diagnosis not present

## 2016-04-13 DIAGNOSIS — M62838 Other muscle spasm: Secondary | ICD-10-CM | POA: Diagnosis not present

## 2016-04-13 DIAGNOSIS — K5900 Constipation, unspecified: Secondary | ICD-10-CM | POA: Diagnosis not present

## 2016-04-14 ENCOUNTER — Other Ambulatory Visit: Payer: Self-pay | Admitting: Internal Medicine

## 2016-04-20 DIAGNOSIS — N50812 Left testicular pain: Secondary | ICD-10-CM | POA: Diagnosis not present

## 2016-04-20 DIAGNOSIS — M62838 Other muscle spasm: Secondary | ICD-10-CM | POA: Diagnosis not present

## 2016-04-20 DIAGNOSIS — R351 Nocturia: Secondary | ICD-10-CM | POA: Diagnosis not present

## 2016-04-20 DIAGNOSIS — M6281 Muscle weakness (generalized): Secondary | ICD-10-CM | POA: Diagnosis not present

## 2016-04-20 DIAGNOSIS — R350 Frequency of micturition: Secondary | ICD-10-CM | POA: Diagnosis not present

## 2016-04-20 DIAGNOSIS — K5900 Constipation, unspecified: Secondary | ICD-10-CM | POA: Diagnosis not present

## 2016-05-18 DIAGNOSIS — R350 Frequency of micturition: Secondary | ICD-10-CM | POA: Diagnosis not present

## 2016-05-18 DIAGNOSIS — M62838 Other muscle spasm: Secondary | ICD-10-CM | POA: Diagnosis not present

## 2016-05-18 DIAGNOSIS — K5900 Constipation, unspecified: Secondary | ICD-10-CM | POA: Diagnosis not present

## 2016-05-18 DIAGNOSIS — R351 Nocturia: Secondary | ICD-10-CM | POA: Diagnosis not present

## 2016-05-18 DIAGNOSIS — M6281 Muscle weakness (generalized): Secondary | ICD-10-CM | POA: Diagnosis not present

## 2016-05-18 DIAGNOSIS — N50812 Left testicular pain: Secondary | ICD-10-CM | POA: Diagnosis not present

## 2016-06-10 ENCOUNTER — Ambulatory Visit (INDEPENDENT_AMBULATORY_CARE_PROVIDER_SITE_OTHER): Payer: Medicare Other | Admitting: Nurse Practitioner

## 2016-06-10 ENCOUNTER — Other Ambulatory Visit (INDEPENDENT_AMBULATORY_CARE_PROVIDER_SITE_OTHER): Payer: Medicare Other

## 2016-06-10 VITALS — BP 160/110 | HR 67 | Temp 98.2°F | Wt 212.0 lb

## 2016-06-10 DIAGNOSIS — K529 Noninfective gastroenteritis and colitis, unspecified: Secondary | ICD-10-CM

## 2016-06-10 DIAGNOSIS — I10 Essential (primary) hypertension: Secondary | ICD-10-CM

## 2016-06-10 LAB — CBC WITH DIFFERENTIAL/PLATELET
BASOS ABS: 0 10*3/uL (ref 0.0–0.1)
Basophils Relative: 0.4 % (ref 0.0–3.0)
EOS ABS: 0.3 10*3/uL (ref 0.0–0.7)
Eosinophils Relative: 3.9 % (ref 0.0–5.0)
HEMATOCRIT: 42.4 % (ref 39.0–52.0)
HEMOGLOBIN: 14.3 g/dL (ref 13.0–17.0)
Lymphocytes Relative: 22.9 % (ref 12.0–46.0)
Lymphs Abs: 1.7 10*3/uL (ref 0.7–4.0)
MCHC: 33.9 g/dL (ref 30.0–36.0)
MCV: 89.1 fl (ref 78.0–100.0)
MONO ABS: 0.7 10*3/uL (ref 0.1–1.0)
Monocytes Relative: 9.7 % (ref 3.0–12.0)
Neutro Abs: 4.7 10*3/uL (ref 1.4–7.7)
Neutrophils Relative %: 63.1 % (ref 43.0–77.0)
Platelets: 225 10*3/uL (ref 150.0–400.0)
RBC: 4.76 Mil/uL (ref 4.22–5.81)
RDW: 13 % (ref 11.5–15.5)
WBC: 7.4 10*3/uL (ref 4.0–10.5)

## 2016-06-10 LAB — BASIC METABOLIC PANEL
BUN: 18 mg/dL (ref 6–23)
CALCIUM: 9.5 mg/dL (ref 8.4–10.5)
CHLORIDE: 107 meq/L (ref 96–112)
CO2: 27 mEq/L (ref 19–32)
CREATININE: 1.13 mg/dL (ref 0.40–1.50)
GFR: 67.79 mL/min (ref >60.00)
Glucose, Bld: 110 mg/dL — ABNORMAL HIGH (ref 70–99)
Potassium: 4 mEq/L (ref 3.5–5.1)
Sodium: 142 mEq/L (ref 135–145)

## 2016-06-10 MED ORDER — BISMUTH SUBSALICYLATE 262 MG/15ML PO SUSP: 30.0000 mL | ORAL | 0 refills | Status: DC | PRN

## 2016-06-10 NOTE — Progress Notes (Signed)
Pre visit review using our clinic review tool, if applicable. No additional management support is needed unless otherwise documented below in the visit note. 

## 2016-06-10 NOTE — Progress Notes (Signed)
Subjective:  Patient ID: Troy [name], male    DOB: 1946-02-04  Age: 71 y.o. MRN: [id number]  CC: GI Problem (used restroom 25 times Friday,ran fever,chill,stomach cramp for 5 days. not go for 4 days)   GI Problem  The primary symptoms include fever, weight loss, fatigue, abdominal pain, vomiting and diarrhea. Primary symptoms do not include nausea, melena, hematemesis, jaundice, hematochezia, dysuria, myalgias, arthralgias or rash. The illness began 3 to 5 days ago. The onset was sudden. The problem has been gradually improving.  The illness is also significant for chills, anorexia and bloating. The illness does not include constipation, tenesmus, back pain or itching. Significant associated medical issues include alcohol abuse. Associated medical issues do not include inflammatory bowel disease, GERD, gallstones, liver disease, PUD, irritable bowel syndrome, hemorrhoids or diverticulitis.  some improvement with peotobismol. Last colonoscopy 2009 (no polylps, no diverticula)  Outpatient Medications Prior to Visit  Medication Sig Dispense Refill  . aspirin 81 MG EC tablet Take 81 mg by mouth daily.      Marland Kitchen atenolol (TENORMIN) 25 MG tablet TAKE 2 TABLETS BY MOUTH EVERY DAY 180 tablet 0  . atenolol (TENORMIN) 50 MG tablet Take 1 tablet (50 mg total) by mouth daily. 90 tablet 3  . atorvastatin (LIPITOR) 40 MG tablet Take 1 tablet (40 mg total) by mouth daily at 6 PM. 90 tablet 3  . atorvastatin (LIPITOR) 40 MG tablet TAKE 1 TABLET(40 MG) BY MOUTH DAILY 6 PM 90 tablet 0  . losartan (COZAAR) 100 MG tablet TAKE ONE TABLET BY MOUTH ONCE DAILY 90 tablet 3  . Probiotic Product (PROBIOTIC PO) Take by mouth daily.      . traMADol (ULTRAM) 50 MG tablet TAKE 1 TABLET BY MOUTH EVERY 6 HOURS AS NEEDED FOR SEVERE PAIN 120 tablet 0  . fluticasone (FLONASE) 50 MCG/ACT nasal spray Place 2 sprays into both nostrils daily. 58 g 3  . montelukast (SINGULAIR) 10 MG tablet Take 1 tablet (10 mg total) by mouth  daily. (Patient not taking: Reported on 06/10/2016) 90 tablet 3   No facility-administered medications prior to visit.     ROS See HPI  Objective:  BP (!) 160/110   Pulse 67   Temp 98.2 F (36.8 C) (Oral)   Wt 212 lb (96.2 kg)   SpO2 98%   BMI 27.22 kg/m   BP Readings from Last 3 Encounters:  06/10/16 (!) 160/110  08/29/15 136/86  02/26/15 (!) 160/80    Wt Readings from Last 3 Encounters:  06/10/16 212 lb (96.2 kg)  08/29/15 223 lb (101.2 kg)  02/26/15 226 lb 8 oz (102.7 kg)    Physical Exam  Constitutional: He is oriented to person, place, and time.  Cardiovascular: Normal rate.   Pulmonary/Chest: Effort normal.  Abdominal: Soft. Bowel sounds are normal. He exhibits no distension. There is tenderness. There is no rebound and no guarding.  Diffuse ABD tenderness  Musculoskeletal: He exhibits no edema.  Neurological: He is alert and oriented to person, place, and time.  Skin: Skin is warm and dry.  Vitals reviewed.   Lab Results  Component Value Date   WBC 7.4 06/10/2016   HGB 14.3 06/10/2016   HCT 42.4 06/10/2016   PLT 225.0 06/10/2016   GLUCOSE 110 (H) 06/10/2016   CHOL 201 (H) 02/26/2015   TRIG (H) 02/26/2015    438.0 Triglyceride is over 400; calculations on Lipids are invalid.   HDL 44.60 02/26/2015   LDLDIRECT 101.0 02/26/2015   LDLCALC 88  09/07/2013   ALT 27 02/26/2015   AST 20 02/26/2015   NA 142 06/10/2016   K 4.0 06/10/2016   CL 107 06/10/2016   CREATININE 1.13 06/10/2016   BUN 18 06/10/2016   CO2 27 06/10/2016   TSH 2.69 02/26/2015   PSA 1.93 10/01/2011    No results found.  Assessment & Plan:   Troy [name] was seen today for gi problem.  Diagnoses and all orders for this visit:  Gastroenteritis, acute -     Basic metabolic panel; Future -     CBC w/Diff; Future -     Stool Culture; Future -     bismuth subsalicylate (PEPTO BISMOL) 262 MG/15ML suspension; Take 30 mLs by mouth every 4 (four) hours as needed for diarrhea or loose  stools.  Essential hypertension   I am having Mr. Brodrick start on bismuth subsalicylate. I am also having him maintain his aspirin, Probiotic Product (PROBIOTIC PO), fluticasone, atorvastatin, atenolol, montelukast, atorvastatin, traMADol, losartan, atenolol, and TURMERIC PO.  Meds ordered this encounter  Medications  . TURMERIC PO    Sig: Take by mouth 2 (two) times daily.  Marland Kitchen bismuth subsalicylate (PEPTO BISMOL) 262 MG/15ML suspension    Sig: Take 30 mLs by mouth every 4 (four) hours as needed for diarrhea or loose stools.    Dispense:  360 mL    Refill:  0    Order Specific Question:   Supervising Provider    Answer:   Cassandria Anger [[id number]]    Follow-up: Return if symptoms worsen or fail to improve.  Wilfred Lacy, NP

## 2016-06-10 NOTE — Patient Instructions (Signed)
Please monitor BP twice a day for the next 2days. If BP is persistent greater than 150/90, please call office immediately.   Viral Gastroenteritis, Adult Introduction Viral gastroenteritis is also known as the stomach flu. This condition is caused by certain germs (viruses). These germs can be passed from person to person very easily (are very contagious). This condition can cause sudden watery poop (diarrhea), fever, and throwing up (vomiting). Having watery poop and throwing up can make you feel weak and cause you to get dehydrated. Dehydration can make you tired and thirsty, make you have a dry mouth, and make it so you pee (urinate) less often. Older adults and people with other diseases or a weak defense system (immune system) are at higher risk for dehydration. It is important to replace the fluids that you lose from having watery poop and throwing up. Follow these instructions at home: Follow instructions from your doctor about how to care for yourself at home. Eating and drinking Follow these instructions as told by your doctor:  Take an oral rehydration solution (ORS). This is a drink that is sold at pharmacies and stores.  Drink clear fluids in small amounts as you are able, such as:  Water.  Ice chips.  Diluted fruit juice.  Low-calorie sports drinks.  Eat bland, easy-to-digest foods in small amounts as you are able, such as:  Bananas.  Applesauce.  Rice.  Low-fat (lean) meats.  Toast.  Crackers.  Avoid fluids that have a lot of sugar or caffeine in them.  Avoid alcohol.  Avoid spicy or fatty foods. General instructions  Drink enough fluid to keep your pee (urine) clear or pale yellow.  Wash your hands often. If you cannot use soap and water, use hand sanitizer.  Make sure that all people in your home wash their hands well and often.  Rest at home while you get better.  Take over-the-counter and prescription medicines only as told by your  doctor.  Watch your condition for any changes.  Take a warm bath to help with any burning or pain from having watery poop.  Keep all follow-up visits as told by your doctor. This is important. Contact a doctor if:  You cannot keep fluids down.  Your symptoms get worse.  You have new symptoms.  You feel light-headed or dizzy.  You have muscle cramps. Get help right away if:  You have chest pain.  You feel very weak or you pass out (faint).  You see blood in your throw-up.  Your throw-up looks like coffee grounds.  You have bloody or black poop (stools) or poop that look like tar.  You have a very bad headache, a stiff neck, or both.  You have a rash.  You have very bad pain, cramping, or bloating in your belly (abdomen).  You have trouble breathing.  You are breathing very quickly.  Your heart is beating very quickly.  Your skin feels cold and clammy.  You feel confused.  You have pain when you pee.  You have signs of dehydration, such as:  Dark pee, hardly any pee, or no pee.  Cracked lips.  Dry mouth.  Sunken eyes.  Sleepiness.  Weakness. This information is not intended to replace advice given to you by your health care provider. Make sure you discuss any questions you have with your health care provider. Document Released: 10/07/2007 Document Revised: 11/08/2015 Document Reviewed: 12/25/2014  2017 Elsevier  Food Choices to Help Relieve Diarrhea, Adult When you have diarrhea,  the foods you eat and your eating habits are very important. Choosing the right foods and drinks can help relieve diarrhea. Also, because diarrhea can last up to 7 days, you need to replace lost fluids and electrolytes (such as sodium, potassium, and chloride) in order to help prevent dehydration. What general guidelines do I need to follow?  Slowly drink 1 cup (8 oz) of fluid for each episode of diarrhea. If you are getting enough fluid, your urine will be clear or pale  yellow.  Eat starchy foods. Some good choices include white rice, white toast, pasta, low-fiber cereal, baked potatoes (without the skin), saltine crackers, and bagels.  Avoid large servings of any cooked vegetables.  Limit fruit to two servings per day. A serving is  cup or 1 small piece.  Choose foods with less than 2 g of fiber per serving.  Limit fats to less than 8 tsp (38 g) per day.  Avoid fried foods.  Eat foods that have probiotics in them. Probiotics can be found in certain dairy products.  Avoid foods and beverages that may increase the speed at which food moves through the stomach and intestines (gastrointestinal tract). Things to avoid include:  High-fiber foods, such as dried fruit, raw fruits and vegetables, nuts, seeds, and whole grain foods.  Spicy foods and high-fat foods.  Foods and beverages sweetened with high-fructose corn syrup, honey, or sugar alcohols such as xylitol, sorbitol, and mannitol. What foods are recommended? Grains  White rice. White, [place], or pita breads (fresh or toasted), including plain rolls, buns, or bagels. White pasta. Saltine, soda, or graham crackers. Pretzels. Low-fiber cereal. Cooked cereals made with water (such as cornmeal, farina, or cream cereals). Plain muffins. Matzo. Melba toast. Zwieback. Vegetables  Potatoes (without the skin). Strained tomato and vegetable juices. Most well-cooked and canned vegetables without seeds. Tender lettuce. Fruits  Cooked or canned applesauce, apricots, cherries, fruit cocktail, grapefruit, peaches, pears, or plums. Fresh bananas, apples without skin, cherries, grapes, cantaloupe, grapefruit, peaches, oranges, or plums. Meat and Other Protein Products  Baked or boiled chicken. Eggs. Tofu. Fish. Seafood. Smooth peanut butter. Ground or well-cooked tender beef, ham, veal, lamb, pork, or poultry. Dairy  Plain yogurt, kefir, and unsweetened liquid yogurt. Lactose-free milk, buttermilk, or soy milk.  Plain hard cheese. Beverages  Sport drinks. Clear broths. Diluted fruit juices (except prune). Regular, caffeine-free sodas such as ginger ale. Water. Decaffeinated teas. Oral rehydration solutions. Sugar-free beverages not sweetened with sugar alcohols. Other  Bouillon, broth, or soups made from recommended foods. The items listed above may not be a complete list of recommended foods or beverages. Contact your dietitian for more options.  What foods are not recommended? Grains  Whole grain, whole wheat, bran, or rye breads, rolls, pastas, crackers, and cereals. Wild or brown rice. Cereals that contain more than 2 g of fiber per serving. Corn tortillas or taco shells. Cooked or dry oatmeal. Granola. Popcorn. Vegetables  Raw vegetables. Cabbage, broccoli, Brussels sprouts, artichokes, baked beans, beet greens, corn, kale, legumes, peas, sweet potatoes, and yams. Potato skins. Cooked spinach and cabbage. Fruits  Dried fruit, including raisins and dates. Raw fruits. Stewed or dried prunes. Fresh apples with skin, apricots, mangoes, pears, raspberries, and strawberries. Meat and Other Protein Products  Chunky peanut butter. Nuts and seeds. Beans and lentils. Berniece Salines. Dairy  High-fat cheeses. Milk, chocolate milk, and beverages made with milk, such as milk shakes. Cream. Ice cream. Sweets and Desserts  Sweet rolls, doughnuts, and sweet breads. Pancakes and waffles.  Fats and Oils  Butter. Cream sauces. Margarine. Salad oils. Plain salad dressings. Olives. Avocados. Beverages  Caffeinated beverages (such as coffee, tea, soda, or energy drinks). Alcoholic beverages. Fruit juices with pulp. Prune juice. Soft drinks sweetened with high-fructose corn syrup or sugar alcohols. Other  Coconut. Hot sauce. Chili powder. Mayonnaise. Gravy. Cream-based or milk-based soups. The items listed above may not be a complete list of foods and beverages to avoid. Contact your dietitian for more information.  What  should I do if I become dehydrated? Diarrhea can sometimes lead to dehydration. Signs of dehydration include dark urine and dry mouth and skin. If you think you are dehydrated, you should rehydrate with an oral rehydration solution. These solutions can be purchased at pharmacies, retail stores, or online. Drink -1 cup (120-240 mL) of oral rehydration solution each time you have an episode of diarrhea. If drinking this amount makes your diarrhea worse, try drinking smaller amounts more often. For example, drink 1-3 tsp (5-15 mL) every 5-10 minutes. A general rule for staying hydrated is to drink 1-2 L of fluid per day. Talk to your health care provider about the specific amount you should be drinking each day. Drink enough fluids to keep your urine clear or pale yellow. This information is not intended to replace advice given to you by your health care provider. Make sure you discuss any questions you have with your health care provider. Document Released: 07/11/2003 Document Revised: 09/26/2015 Document Reviewed: 03/13/2013 Elsevier Interactive Patient Education  2017 [employer].

## 2016-06-15 ENCOUNTER — Other Ambulatory Visit: Payer: Self-pay | Admitting: Internal Medicine

## 2016-06-15 ENCOUNTER — Other Ambulatory Visit: Payer: Medicare Other

## 2016-06-15 DIAGNOSIS — K529 Noninfective gastroenteritis and colitis, unspecified: Secondary | ICD-10-CM | POA: Diagnosis not present

## 2016-06-18 ENCOUNTER — Telehealth: Payer: Self-pay

## 2016-06-18 LAB — STOOL CULTURE

## 2016-06-18 NOTE — Telephone Encounter (Signed)
Routing to javier, please handle, thanks 

## 2016-06-18 NOTE — Telephone Encounter (Signed)
Patient notified of results.

## 2016-06-18 NOTE — Telephone Encounter (Signed)
-----   Message from Flossie Buffy, NP sent at 06/18/2016 12:29 PM EST ----- Normal results, see office note

## 2016-06-18 NOTE — Progress Notes (Signed)
Normal results, see office note

## 2016-07-06 DIAGNOSIS — M791 Myalgia: Secondary | ICD-10-CM | POA: Diagnosis not present

## 2016-07-13 ENCOUNTER — Other Ambulatory Visit: Payer: Self-pay | Admitting: Internal Medicine

## 2016-07-29 ENCOUNTER — Telehealth: Payer: Self-pay | Admitting: Internal Medicine

## 2016-07-29 DIAGNOSIS — L738 Other specified follicular disorders: Secondary | ICD-10-CM | POA: Diagnosis not present

## 2016-07-29 DIAGNOSIS — D2271 Melanocytic nevi of right lower limb, including hip: Secondary | ICD-10-CM | POA: Diagnosis not present

## 2016-07-29 DIAGNOSIS — D2239 Melanocytic nevi of other parts of face: Secondary | ICD-10-CM | POA: Diagnosis not present

## 2016-07-29 DIAGNOSIS — Z85828 Personal history of other malignant neoplasm of skin: Secondary | ICD-10-CM | POA: Diagnosis not present

## 2016-07-29 DIAGNOSIS — D225 Melanocytic nevi of trunk: Secondary | ICD-10-CM | POA: Diagnosis not present

## 2016-07-29 DIAGNOSIS — D2272 Melanocytic nevi of left lower limb, including hip: Secondary | ICD-10-CM | POA: Diagnosis not present

## 2016-07-29 DIAGNOSIS — D2261 Melanocytic nevi of right upper limb, including shoulder: Secondary | ICD-10-CM | POA: Diagnosis not present

## 2016-07-29 DIAGNOSIS — D2262 Melanocytic nevi of left upper limb, including shoulder: Secondary | ICD-10-CM | POA: Diagnosis not present

## 2016-07-29 DIAGNOSIS — L821 Other seborrheic keratosis: Secondary | ICD-10-CM | POA: Diagnosis not present

## 2016-07-29 DIAGNOSIS — L814 Other melanin hyperpigmentation: Secondary | ICD-10-CM | POA: Diagnosis not present

## 2016-07-29 DIAGNOSIS — D1801 Hemangioma of skin and subcutaneous tissue: Secondary | ICD-10-CM | POA: Diagnosis not present

## 2016-07-29 NOTE — Telephone Encounter (Signed)
Called patient to schedule awv. Patient's phone was busy. Will try to call patient at a later date.

## 2016-08-26 ENCOUNTER — Other Ambulatory Visit: Payer: Self-pay | Admitting: Internal Medicine

## 2016-08-26 NOTE — Telephone Encounter (Signed)
Pt has been seen in the office feb 2018 and remains an active pt  OK for refill - Done hardcopy to Shirron  BUT  - please to make yearly f/u ROV for further refills in the future

## 2016-08-26 NOTE — Telephone Encounter (Signed)
Faxed   [place]- can you please schedule pt please? Thanks!

## 2016-08-26 NOTE — Telephone Encounter (Signed)
Called patient. He was not home. His wife answered. She states she will have him call the office back to set up and appointment for his yearly visits. Thank you.

## 2016-09-14 ENCOUNTER — Other Ambulatory Visit: Payer: Self-pay | Admitting: Internal Medicine

## 2016-09-24 ENCOUNTER — Encounter: Payer: Self-pay | Admitting: Internal Medicine

## 2016-09-24 ENCOUNTER — Telehealth: Payer: Self-pay | Admitting: Internal Medicine

## 2016-09-24 MED ORDER — ATORVASTATIN CALCIUM 40 MG PO TABS: ORAL_TABLET | ORAL | 0 refills | Status: DC

## 2016-09-24 NOTE — Telephone Encounter (Signed)
Called pt, to inform him that a refill for the atorvastatin was sent in to his pharmacy and to see if he has made an appt or would like to make an appt. He said I already have an appt and I will be complaining on you. I said excuse me sir. He repeated himself and said "Are you the one that I have been calling the last 2 days that have not been returning my call. Then he asked "who is the one who was rude to my wife. She doesn't get mad over anything and for her to get mad it must have been something seriously wrong." I said "Sir this has been the first time that we have communicated and I'm calling about a medication request that I received 14 minutes ago from one of our receptionist". He said yes I spoke with [place]. I said that's correct she is who forwarded me the message. Then he states well if you aren't the one then who was it? He said who is the person that does the medications. I stated that each providers assistant does the medication request. He said well if you work with Dr. Jenny Reichmann then it was you. I explained to him that I have only been working here for 2 months and reiterated that this is our very first communication. Then he proceeds to tell me not to get smart with him. I said I'm not getting smart but I am explaining I'm not sure of who you are referring to or what has happened to get you upset but I'm following up on the message that I just received. The patient is still yelling at me at this point so I asked him would he like for me to put him on hold to get the office manager. He stated no I don't want to talk to her. I left a message for her earlier and I want to see just how long it is going to take for her to call me back.

## 2016-09-24 NOTE — Telephone Encounter (Signed)
Pt would like a refill of atorvastatin (LIPITOR) 40 MG tablet  Did not want to go through triage.  Same pharmacy.

## 2016-09-25 NOTE — Telephone Encounter (Signed)
OK for patient dismissal, letter Done hardcopy to [employer]

## 2016-09-25 NOTE — Telephone Encounter (Signed)
Called and spoke with Troy [name] today. He was confused about his previous appts, his medication refills and felt like we could have supported him more professionally. He has an appt with Dr. Jenny Reichmann on Tuesday. Dr. Jenny Reichmann has agreed to rescind the dismissal at this time, as there have been no previous incidents. Pt was unaware of the potential dismissal, so does not need to be informed of the change.

## 2016-09-25 NOTE — Telephone Encounter (Signed)
Letter and dismissal was given to [name].

## 2016-09-29 ENCOUNTER — Encounter: Payer: Self-pay | Admitting: Internal Medicine

## 2016-09-29 ENCOUNTER — Ambulatory Visit (INDEPENDENT_AMBULATORY_CARE_PROVIDER_SITE_OTHER): Payer: Medicare Other | Admitting: Internal Medicine

## 2016-09-29 ENCOUNTER — Other Ambulatory Visit: Payer: Medicare Other

## 2016-09-29 VITALS — BP 142/82 | HR 62 | Ht 74.5 in | Wt 215.0 lb

## 2016-09-29 DIAGNOSIS — N320 Bladder-neck obstruction: Secondary | ICD-10-CM

## 2016-09-29 DIAGNOSIS — I10 Essential (primary) hypertension: Secondary | ICD-10-CM

## 2016-09-29 DIAGNOSIS — E785 Hyperlipidemia, unspecified: Secondary | ICD-10-CM | POA: Diagnosis not present

## 2016-09-29 NOTE — Assessment & Plan Note (Signed)
Asympt, for f/u psa as he is due 

## 2016-09-29 NOTE — Patient Instructions (Addendum)
Please continue all other medications as before, and refills have been done if requested.  Please have the pharmacy call with any other refills you may need.  Please continue your efforts at being more active, low cholesterol diet, and weight control.  You are otherwise up to date with prevention measures today.  Please keep your appointments with your specialists as you may have planned  Please go to the LAB in the Basement (turn left off the elevator) for the tests to be done today  You will be contacted by phone if any changes need to be made immediately.  Otherwise, you will receive a letter about your results with an explanation, but please check with MyChart first.  Please remember to sign up for MyChart if you have not done so, as this will be important to you in the future with finding out test results, communicating by private email, and scheduling acute appointments online when needed.  If you have Medicare related insurance (such as traditional Medicare, Blue [employer] or [employer], or similar), Please make an appointment at the [facility] with Sharee Pimple, the [employer], for your Wellness Visit in this office, which is a benefit with your insurance.  Please return in 1 year for your yearly visit, or sooner if needed

## 2016-09-29 NOTE — Assessment & Plan Note (Signed)
stable overall by history and exam, recent data reviewed with pt, and pt to continue medical treatment as before,  to f/u any worsening symptoms or concerns Lab Results  Component Value Date   Poneto 88 09/07/2013   For f/u lab today

## 2016-09-29 NOTE — Assessment & Plan Note (Signed)
stable overall by history and exam, recent data reviewed with pt, and pt to continue medical treatment as before,  to f/u any worsening symptoms or concerns BP Readings from Last 3 Encounters:  09/29/16 (!) 142/82  06/10/16 (!) 160/110  08/29/15 136/86

## 2016-09-29 NOTE — Progress Notes (Signed)
Subjective:    Patient ID: Troy [name], male    DOB: 03/12/1946, 71 y.o.   MRN: [id number]  HPI    Here for yearly f/u;  Overall doing ok;  Pt denies Chest pain, worsening SOB, DOE, wheezing, orthopnea, PND, worsening LE edema, palpitations, dizziness or syncope.  Pt denies neurological change such as new headache, facial or extremity weakness.  Pt denies polydipsia, polyuria, or low sugar symptoms. Pt states overall good compliance with treatment and medications, good tolerability, and has been trying to follow appropriate diet.  Pt denies worsening depressive symptoms, suicidal ideation or panic. No fever, night sweats, wt loss, loss of appetite, or other constitutional symptoms.  Pt states good ability with ADL's, has low fall risk, home safety reviewed and adequate, no other significant changes in hearing or vision, and occasionally active with exercise with playing golf and working in the yard  Denies urinary symptoms such as dysuria, frequency, urgency, flank pain, hematuria or n/v, fever, chills.  Denies worsening reflux, abd pain, dysphagia, n/v, bowel change or blood. Past Medical History:  Diagnosis Date  . ALLERGIC RHINITIS 05/04/2007  . BACK PAIN, LUMBAR 02/17/2010  . CHEST PAIN 07/15/2009  . CONSTIPATION 02/17/2010  . Heart murmur    remote hx of murmur  . HYPERLIPIDEMIA 05/04/2007  . HYPERTENSION 05/04/2007  . Interstitial cystitis 08/30/2013  . Lumbar disc disease 10/01/2011  . NECK PAIN, ACUTE 07/15/2009  . Prostate enlargement   . PSVT 05/04/2007  . Skin cancer 08/30/2013   Basal cell x 2, dec 2014  . Spermatocele   . VERTIGO 05/04/2007   Past Surgical History:  Procedure Laterality Date  . APPENDECTOMY    . KNEE ARTHROSCOPY  30 yrs ago  . s/p dental implant left upper jaw  11/09   also rt lower jaw  . s/p RK surgery bilat    . SPERMATOCELECTOMY  12/04/2011   Procedure: SPERMATOCELECTOMY;  Surgeon: Hanley Ben, MD;  Location: [facility];  Service:  Urology;  Laterality: Left;  1 hour requested for this case   . TONSILLECTOMY      reports that he has never smoked. He has never used smokeless tobacco. He reports that he does not drink alcohol or use drugs. family history includes Cancer in his mother; Dementia in his mother; Diabetes in his other; Heart disease in his other; Hyperlipidemia in his other; Hypertension in his mother. Allergies  Allergen Reactions  . Celebrex [Celecoxib] Anaphylaxis    Oral sores  . Cyclobenzaprine Hcl     REACTION: faical skin rash, 'hard cysts'  . Mobic [Meloxicam] Other (See Comments)    Sores in mouth   Current Outpatient Prescriptions on File Prior to Visit  Medication Sig Dispense Refill  . aspirin 81 MG EC tablet Take 81 mg by mouth daily.      Marland Kitchen atenolol (TENORMIN) 25 MG tablet TAKE 2 TABLETS BY MOUTH EVERY DAY 180 tablet 1  . atenolol (TENORMIN) 50 MG tablet Take 1 tablet (50 mg total) by mouth daily. 90 tablet 3  . atorvastatin (LIPITOR) 40 MG tablet Take 1 tablet (40 mg total) by mouth daily at 6 PM. 90 tablet 3  . bismuth subsalicylate (PEPTO BISMOL) 262 MG/15ML suspension Take 30 mLs by mouth every 4 (four) hours as needed for diarrhea or loose stools. 360 mL 0  . losartan (COZAAR) 100 MG tablet TAKE ONE TABLET BY MOUTH ONCE DAILY 90 tablet 3  . montelukast (SINGULAIR) 10 MG tablet Take 1 tablet (  10 mg total) by mouth daily. 90 tablet 3  . Probiotic Product (PROBIOTIC PO) Take by mouth daily.      . traMADol (ULTRAM) 50 MG tablet TAKE 1 TABLET BY MOUTH EVERY 6 HOURS AS NEEDED FOR SEVERE PAIN 120 tablet 0  . TURMERIC PO Take by mouth 2 (two) times daily.    . fluticasone (FLONASE) 50 MCG/ACT nasal spray Place 2 sprays into both nostrils daily. 58 g 3   No current facility-administered medications on file prior to visit.    Review of Systems Constitutional: Negative for other unusual diaphoresis, sweats, appetite or weight changes HENT: Negative for other worsening hearing loss, ear pain,  facial swelling, mouth sores or neck stiffness.   Eyes: Negative for other worsening pain, redness or other visual disturbance.  Respiratory: Negative for other stridor or swelling Cardiovascular: Negative for other palpitations or other chest pain  Gastrointestinal: Negative for worsening diarrhea or loose stools, blood in stool, distention or other pain Genitourinary: Negative for hematuria, flank pain or other change in urine volume.  Musculoskeletal: Negative for myalgias or other joint swelling.  Skin: Negative for other color change, or other wound or worsening drainage.  Neurological: Negative for other syncope or numbness. Hematological: Negative for other adenopathy or swelling Psychiatric/Behavioral: Negative for hallucinations, other worsening agitation, SI, self-injury, or new decreased concentration All other system neg per pt    Objective:   Physical Exam BP (!) 142/82   Pulse 62   Ht 6' 2.5" (1.892 m)   Wt 215 lb (97.5 kg)   SpO2 98%   BMI 27.24 kg/m  VS noted, not ill appearing Constitutional: Pt is oriented to person, place, and time. Appears well-developed and well-nourished, in no significant distress and comfortable Head: Normocephalic and atraumatic  Eyes: Conjunctivae and EOM are normal. Pupils are equal, round, and reactive to light Right Ear: External ear normal without discharge Left Ear: External ear normal without discharge Nose: Nose without discharge or deformity Mouth/Throat: Oropharynx is without other ulcerations and moist  Neck: Normal range of motion. Neck supple. No JVD present. No tracheal deviation present or significant neck LA or mass Cardiovascular: Normal rate, regular rhythm, normal heart sounds and intact distal pulses.   Pulmonary/Chest: WOB normal and breath sounds without rales or wheezing  Abdominal: Soft. Bowel sounds are normal. NT. No HSM  Musculoskeletal: Normal range of motion. Exhibits no edema Lymphadenopathy: Has no other  cervical adenopathy.  Neurological: Pt is alert and oriented to person, place, and time. Pt has normal reflexes. No cranial nerve deficit. Motor grossly intact, Gait intact Skin: Skin is warm and dry. No rash noted or new ulcerations Psychiatric:  Has normal mood and affect. Behavior is normal without agitation No other exam findings    Assessment & Plan:

## 2016-09-30 ENCOUNTER — Other Ambulatory Visit (INDEPENDENT_AMBULATORY_CARE_PROVIDER_SITE_OTHER): Payer: Medicare Other

## 2016-09-30 DIAGNOSIS — E785 Hyperlipidemia, unspecified: Secondary | ICD-10-CM | POA: Diagnosis not present

## 2016-09-30 DIAGNOSIS — N320 Bladder-neck obstruction: Secondary | ICD-10-CM

## 2016-09-30 LAB — CBC WITH DIFFERENTIAL/PLATELET
BASOS PCT: 0.6 % (ref 0.0–3.0)
Basophils Absolute: 0 10*3/uL (ref 0.0–0.1)
EOS PCT: 6.8 % — AB (ref 0.0–5.0)
Eosinophils Absolute: 0.4 10*3/uL (ref 0.0–0.7)
HCT: 43.6 % (ref 39.0–52.0)
Hemoglobin: 14.7 g/dL (ref 13.0–17.0)
LYMPHS ABS: 1.6 10*3/uL (ref 0.7–4.0)
Lymphocytes Relative: 29.5 % (ref 12.0–46.0)
MCHC: 33.7 g/dL (ref 30.0–36.0)
MCV: 90.4 fl (ref 78.0–100.0)
Monocytes Absolute: 0.5 10*3/uL (ref 0.1–1.0)
Monocytes Relative: 9.5 % (ref 3.0–12.0)
NEUTROS ABS: 2.9 10*3/uL (ref 1.4–7.7)
NEUTROS PCT: 53.6 % (ref 43.0–77.0)
PLATELETS: 189 10*3/uL (ref 150.0–400.0)
RBC: 4.83 Mil/uL (ref 4.22–5.81)
RDW: 13.2 % (ref 11.5–15.5)
WBC: 5.4 10*3/uL (ref 4.0–10.5)

## 2016-09-30 LAB — URINALYSIS, ROUTINE W REFLEX MICROSCOPIC
Bilirubin Urine: NEGATIVE
Hgb urine dipstick: NEGATIVE
Ketones, ur: NEGATIVE
Leukocytes, UA: NEGATIVE
Nitrite: NEGATIVE
PH: 6 (ref 5.0–8.0)
RBC / HPF: NONE SEEN (ref 0–?)
Specific Gravity, Urine: 1.015 (ref 1.000–1.030)
TOTAL PROTEIN, URINE-UPE24: NEGATIVE
Urine Glucose: NEGATIVE
Urobilinogen, UA: 0.2 (ref 0.0–1.0)
WBC UA: NONE SEEN (ref 0–?)

## 2016-09-30 LAB — LIPID PANEL
Cholesterol: 177 mg/dL (ref 0–200)
HDL: 49.2 mg/dL (ref >39.00)
LDL Cholesterol: 100 mg/dL — ABNORMAL HIGH (ref 0–99)
NONHDL: 127.71
Total CHOL/HDL Ratio: 4
Triglycerides: 141 mg/dL (ref 0.0–149.0)
VLDL: 28.2 mg/dL (ref 0.0–40.0)

## 2016-09-30 LAB — BASIC METABOLIC PANEL
BUN: 25 mg/dL — ABNORMAL HIGH (ref 6–23)
CALCIUM: 9.2 mg/dL (ref 8.4–10.5)
CO2: 28 meq/L (ref 19–32)
Chloride: 105 mEq/L (ref 96–112)
Creatinine, Ser: 1.04 mg/dL (ref 0.40–1.50)
GFR: 74.54 mL/min (ref >60.00)
Glucose, Bld: 111 mg/dL — ABNORMAL HIGH (ref 70–99)
Potassium: 4.3 mEq/L (ref 3.5–5.1)
SODIUM: 138 meq/L (ref 135–145)

## 2016-09-30 LAB — HEPATIC FUNCTION PANEL
ALK PHOS: 47 U/L (ref 39–117)
ALT: 23 U/L (ref 0–53)
AST: 18 U/L (ref 0–37)
Albumin: 4.2 g/dL (ref 3.5–5.2)
BILIRUBIN DIRECT: 0.1 mg/dL (ref 0.0–0.3)
TOTAL PROTEIN: 6.8 g/dL (ref 6.0–8.3)
Total Bilirubin: 0.4 mg/dL (ref 0.2–1.2)

## 2016-09-30 LAB — PSA: PSA: 3.24 ng/mL (ref 0.10–4.00)

## 2016-09-30 LAB — TSH: TSH: 4.88 u[IU]/mL — ABNORMAL HIGH (ref 0.35–4.50)

## 2016-10-12 NOTE — Progress Notes (Addendum)
Subjective:   Troy [name] is a 71 y.o. male who presents for Medicare Annual/Subsequent preventive examination.  Review of Systems:  No ROS.  Medicare Wellness Visit. Additional risk factors are reflected in the social history.  Cardiac Risk Factors include: none;dyslipidemia;hypertension;male gender Sleep patterns: has frequent nighttime awakenings, gets up 4-5 times nightly to void and sleeps 4-5 hours nightly. Has trouble sleeping due to urinary status of which he is seeing urologist and uriary therapist.  Home Safety/Smoke Alarms: Feels safe in home. Smoke alarms in place.  Living environment; residence and Firearm Safety: 1-story house/ trailer, no firearms. Lives with wife, no needs for DME Seat Belt Safety/Bike Helmet: Wears seat belt.   Counseling:   Eye Exam- appointment yearly Dental- appointment every 6 months   Male:   CCS- Last 01/25/08, hemorrhoids, recall 10 years     PSA-  Lab Results  Component Value Date   PSA 3.24 09/30/2016   PSA 1.93 10/01/2011   PSA 1.53 10/22/2010       Objective:    Vitals: BP (!) 144/84   Pulse (!) 56   Resp 20   Ht 6\' 3"  (1.905 m)   Wt 215 lb (97.5 kg)   SpO2 99%   BMI 26.87 kg/m   Body mass index is 26.87 kg/m.  Tobacco History  Smoking Status  . Never Smoker  Smokeless Tobacco  . Never Used     Counseling given: Not Answered   Past Medical History:  Diagnosis Date  . ALLERGIC RHINITIS 05/04/2007  . BACK PAIN, LUMBAR 02/17/2010  . CHEST PAIN 07/15/2009  . CONSTIPATION 02/17/2010  . Heart murmur    remote hx of murmur  . HYPERLIPIDEMIA 05/04/2007  . HYPERTENSION 05/04/2007  . Interstitial cystitis 08/30/2013  . Lumbar disc disease 10/01/2011  . NECK PAIN, ACUTE 07/15/2009  . Prostate enlargement   . PSVT 05/04/2007  . Skin cancer 08/30/2013   Basal cell x 2, dec 2014  . Spermatocele   . VERTIGO 05/04/2007   Past Surgical History:  Procedure Laterality Date  . APPENDECTOMY    . KNEE ARTHROSCOPY  30  yrs ago  . s/p dental implant left upper jaw  11/09   also rt lower jaw  . s/p RK surgery bilat    . SPERMATOCELECTOMY  12/04/2011   Procedure: SPERMATOCELECTOMY;  Surgeon: Hanley Ben, MD;  Location: [facility];  Service: Urology;  Laterality: Left;  1 hour requested for this case   . TONSILLECTOMY     Family History  Problem Relation Age of Onset  . Cancer Mother        breast cancer  . Dementia Mother   . Hypertension Mother   . Hyperlipidemia Other   . Diabetes Other   . Heart disease Other    History  Sexual Activity  . Sexual activity: Not on file    Outpatient Encounter Prescriptions as of 10/13/2016  Medication Sig  . aspirin 81 MG EC tablet Take 81 mg by mouth daily.    Marland Kitchen atenolol (TENORMIN) 25 MG tablet TAKE 2 TABLETS BY MOUTH EVERY DAY  . atorvastatin (LIPITOR) 40 MG tablet Take 1 tablet (40 mg total) by mouth daily at 6 PM.  . bismuth subsalicylate (PEPTO BISMOL) 262 MG/15ML suspension Take 30 mLs by mouth every 4 (four) hours as needed for diarrhea or loose stools.  Marland Kitchen losartan (COZAAR) 100 MG tablet TAKE ONE TABLET BY MOUTH ONCE DAILY  . montelukast (SINGULAIR) 10 MG tablet Take 1  tablet (10 mg total) by mouth daily.  . Probiotic Product (PROBIOTIC PO) Take by mouth daily.    . traMADol (ULTRAM) 50 MG tablet TAKE 1 TABLET BY MOUTH EVERY 6 HOURS AS NEEDED FOR SEVERE PAIN  . TURMERIC PO Take by mouth 2 (two) times daily.  . fluticasone (FLONASE) 50 MCG/ACT nasal spray Place 2 sprays into both nostrils daily.  . [DISCONTINUED] atenolol (TENORMIN) 50 MG tablet Take 1 tablet (50 mg total) by mouth daily.   No facility-administered encounter medications on file as of 10/13/2016.     Activities of Daily Living In your present state of health, do you have any difficulty performing the following activities: 10/13/2016  Hearing? N  Vision? N  Difficulty concentrating or making decisions? N  Walking or climbing stairs? N  Dressing or bathing? N  Doing  errands, shopping? N  Preparing Food and eating ? N  Using the Toilet? N  In the past six months, have you accidently leaked urine? Y  Do you have problems with loss of bowel control? N  Managing your Medications? N  Managing your Finances? N  Housekeeping or managing your Housekeeping? N  Some recent data might be hidden    Patient Care Team: Biagio Borg, MD as PCP - General   Assessment:    Physical assessment deferred to PCP.  Exercise Activities and Dietary recommendations Current Exercise Habits: Home exercise routine, Type of exercise: strength training/weights;stretching;walking;Other - see comments (plays golf), Time (Minutes): 60, Frequency (Times/Week): 6, Weekly Exercise (Minutes/Week): 360, Intensity: Mild, Exercise limited by: None identified  Diet (meal preparation, eat out, water intake, caffeinated beverages, dairy products, fruits and vegetables): in general, a "healthy" diet  , well balanced, low fat/ cholesterol, low salt eats a variety of fruits and vegetables daily, limits salt, fat/cholesterol, sugar, caffeine, drinks 6-8 glasses of water daily.  Goals    . Exercise 150 minutes per week (moderate activity)          Wants to increase exercise;  Play golf 3 times; take one afternoon; walk at least 1 hour;     . I would like to do more cardio           I will take my young dog to [facility] and walk  Briskly 3 times a week or play golf 5 times a week.      Fall Risk Fall Risk  10/13/2016 09/29/2016 08/29/2015 02/26/2015 08/30/2013  Falls in the past year? Yes No No No No  Number falls in past yr: - - - 1 -  Injury with Fall? - - - No -   Depression Screen PHQ 2/9 Scores 10/13/2016 09/29/2016 08/29/2015 02/26/2015  PHQ - 2 Score 0 0 0 0    Cognitive Function       Ad8 score reviewed for issues:  Issues making decisions: no  Less interest in hobbies / activities: no  Repeats questions, stories (family complaining): no  Trouble using ordinary  gadgets (microwave, computer, phone): no  Forgets the month or year: no  Mismanaging finances: no  Remembering appts: no  Daily problems with thinking and/or memory: no Ad8 score is= 0  Immunization History  Administered Date(s) Administered  . H1N1 04/10/2008  . Influenza Whole 05/04/2007, 03/08/2009, 02/17/2010, 01/07/2011  . Influenza, High Dose Seasonal PF 01/29/2014  . Pneumococcal Conjugate-13 08/30/2013  . Pneumococcal Polysaccharide-23 11/25/2006, 08/29/2015  . Td 04/10/2008  . Zoster 04/10/2008   Screening Tests Health Maintenance  Topic Date Due  .  INFLUENZA VACCINE  12/02/2016  . COLONOSCOPY  01/24/2018  . TETANUS/TDAP  04/10/2018  . Hepatitis C Screening  Completed  . PNA vac Low Risk Adult  Completed      Plan:    Continue doing brain stimulating activities (puzzles, reading, adult coloring books, staying active) to keep memory sharp.   Continue to eat heart healthy diet (full of fruits, vegetables, whole grains, lean protein, water--limit salt, fat, and sugar intake) and increase physical activity as tolerated.  I have personally reviewed and noted the following in the patient's chart:   . Medical and social history . Use of alcohol, tobacco or illicit drugs  . Current medications and supplements . Functional ability and status . Nutritional status . Physical activity . Advanced directives . List of other physicians . Vitals . Screenings to include cognitive, depression, and falls . Referrals and appointments  In addition, I have reviewed and discussed with patient certain preventive protocols, quality metrics, and best practice recommendations. A written personalized care plan for preventive services as well as general preventive health recommendations were provided to patient.     Michiel Cowboy, RN  10/13/2016  Medical screening examination/treatment/procedure(s) were performed by non-physician practitioner and as supervising physician I was  immediately available for consultation/collaboration. I agree with above. Cathlean Cower, MD

## 2016-10-12 NOTE — Progress Notes (Signed)
Pre visit review using our clinic review tool, if applicable. No additional management support is needed unless otherwise documented below in the visit note. 

## 2016-10-13 ENCOUNTER — Ambulatory Visit (INDEPENDENT_AMBULATORY_CARE_PROVIDER_SITE_OTHER): Payer: Medicare Other | Admitting: *Deleted

## 2016-10-13 VITALS — BP 144/84 | HR 56 | Resp 20 | Ht 75.0 in | Wt 215.0 lb

## 2016-10-13 DIAGNOSIS — Z0000 Encounter for general adult medical examination without abnormal findings: Secondary | ICD-10-CM | POA: Diagnosis not present

## 2016-10-13 NOTE — Patient Instructions (Signed)
Continue doing brain stimulating activities (puzzles, reading, adult coloring books, staying active) to keep memory sharp.   Continue to eat heart healthy diet (full of fruits, vegetables, whole grains, lean protein, water--limit salt, fat, and sugar intake) and increase physical activity as tolerated.   Troy [name] , Thank you for taking time to come for your Medicare Wellness Visit. I appreciate your ongoing commitment to your health goals. Please review the following plan we discussed and let me know if I can assist you in the future.   These are the goals we discussed: Goals    . Exercise 150 minutes per week (moderate activity)          Wants to increase exercise;  Play golf 3 times; take one afternoon; walk at least 1 hour;     . I would like to do more cardio           I will take my young dog to [facility] and walk  Briskly 3 times a week or play golf 5 times a week.       This is a list of the screening recommended for you and due dates:  Health Maintenance  Topic Date Due  . Flu Shot  12/02/2016  . Colon Cancer Screening  01/24/2018  . Tetanus Vaccine  04/10/2018  .  Hepatitis C: One time screening is recommended by Center for Disease Control  (CDC) for  adults born from [birth year] through 1966.   Completed  . Pneumonia vaccines  Completed

## 2016-11-09 DIAGNOSIS — M25561 Pain in right knee: Secondary | ICD-10-CM | POA: Diagnosis not present

## 2016-11-16 DIAGNOSIS — M25561 Pain in right knee: Secondary | ICD-10-CM | POA: Diagnosis not present

## 2016-11-23 DIAGNOSIS — S83231A Complex tear of medial meniscus, current injury, right knee, initial encounter: Secondary | ICD-10-CM | POA: Diagnosis not present

## 2016-12-09 ENCOUNTER — Other Ambulatory Visit: Payer: Self-pay | Admitting: Internal Medicine

## 2016-12-09 NOTE — Telephone Encounter (Signed)
Done hardcopy to Shirron  

## 2016-12-09 NOTE — Telephone Encounter (Signed)
Faxed

## 2016-12-22 ENCOUNTER — Other Ambulatory Visit: Payer: Self-pay | Admitting: Internal Medicine

## 2016-12-22 DIAGNOSIS — R972 Elevated prostate specific antigen [PSA]: Secondary | ICD-10-CM | POA: Diagnosis not present

## 2016-12-22 MED ORDER — TRAMADOL HCL 50 MG PO TABS: ORAL_TABLET | ORAL | 0 refills | Status: DC

## 2016-12-22 NOTE — Telephone Encounter (Signed)
[facility] for tramadol but is not due for refill until sept 7  I cannot authorize higher quantity for the month, and he already had #120 on aug 8

## 2016-12-23 NOTE — Telephone Encounter (Signed)
faxed

## 2016-12-28 DIAGNOSIS — R351 Nocturia: Secondary | ICD-10-CM | POA: Diagnosis not present

## 2016-12-28 DIAGNOSIS — N401 Enlarged prostate with lower urinary tract symptoms: Secondary | ICD-10-CM | POA: Diagnosis not present

## 2017-01-01 DIAGNOSIS — H43813 Vitreous degeneration, bilateral: Secondary | ICD-10-CM | POA: Diagnosis not present

## 2017-01-01 DIAGNOSIS — H2513 Age-related nuclear cataract, bilateral: Secondary | ICD-10-CM | POA: Diagnosis not present

## 2017-01-01 DIAGNOSIS — D3131 Benign neoplasm of right choroid: Secondary | ICD-10-CM | POA: Diagnosis not present

## 2017-01-01 DIAGNOSIS — H25013 Cortical age-related cataract, bilateral: Secondary | ICD-10-CM | POA: Diagnosis not present

## 2017-01-06 ENCOUNTER — Other Ambulatory Visit: Payer: Self-pay | Admitting: Internal Medicine

## 2017-02-08 DIAGNOSIS — H16223 Keratoconjunctivitis sicca, not specified as Sjogren's, bilateral: Secondary | ICD-10-CM | POA: Diagnosis not present

## 2017-02-08 DIAGNOSIS — H25013 Cortical age-related cataract, bilateral: Secondary | ICD-10-CM | POA: Diagnosis not present

## 2017-02-08 DIAGNOSIS — H2513 Age-related nuclear cataract, bilateral: Secondary | ICD-10-CM | POA: Diagnosis not present

## 2017-02-08 DIAGNOSIS — I10 Essential (primary) hypertension: Secondary | ICD-10-CM | POA: Diagnosis not present

## 2017-02-10 DIAGNOSIS — Z23 Encounter for immunization: Secondary | ICD-10-CM | POA: Diagnosis not present

## 2017-03-19 ENCOUNTER — Other Ambulatory Visit: Payer: Self-pay | Admitting: Internal Medicine

## 2017-04-22 ENCOUNTER — Other Ambulatory Visit: Payer: Self-pay | Admitting: Internal Medicine

## 2017-04-22 NOTE — Telephone Encounter (Signed)
Done erx 

## 2017-05-17 DIAGNOSIS — H25013 Cortical age-related cataract, bilateral: Secondary | ICD-10-CM | POA: Diagnosis not present

## 2017-05-17 DIAGNOSIS — H2512 Age-related nuclear cataract, left eye: Secondary | ICD-10-CM | POA: Diagnosis not present

## 2017-05-17 DIAGNOSIS — H2513 Age-related nuclear cataract, bilateral: Secondary | ICD-10-CM | POA: Diagnosis not present

## 2017-05-18 DIAGNOSIS — M23203 Derangement of unspecified medial meniscus due to old tear or injury, right knee: Secondary | ICD-10-CM | POA: Insufficient documentation

## 2017-05-19 DIAGNOSIS — M23203 Derangement of unspecified medial meniscus due to old tear or injury, right knee: Secondary | ICD-10-CM | POA: Diagnosis not present

## 2017-06-02 ENCOUNTER — Other Ambulatory Visit: Payer: Self-pay | Admitting: Internal Medicine

## 2017-06-08 DIAGNOSIS — H2512 Age-related nuclear cataract, left eye: Secondary | ICD-10-CM | POA: Diagnosis not present

## 2017-06-08 DIAGNOSIS — H25812 Combined forms of age-related cataract, left eye: Secondary | ICD-10-CM | POA: Diagnosis not present

## 2017-06-09 DIAGNOSIS — H2511 Age-related nuclear cataract, right eye: Secondary | ICD-10-CM | POA: Diagnosis not present

## 2017-06-15 DIAGNOSIS — H25811 Combined forms of age-related cataract, right eye: Secondary | ICD-10-CM | POA: Diagnosis not present

## 2017-06-15 DIAGNOSIS — H2511 Age-related nuclear cataract, right eye: Secondary | ICD-10-CM | POA: Diagnosis not present

## 2017-07-09 DIAGNOSIS — M3501 Sicca syndrome with keratoconjunctivitis: Secondary | ICD-10-CM | POA: Diagnosis not present

## 2017-07-09 DIAGNOSIS — H04123 Dry eye syndrome of bilateral lacrimal glands: Secondary | ICD-10-CM | POA: Diagnosis not present

## 2017-07-09 DIAGNOSIS — Z961 Presence of intraocular lens: Secondary | ICD-10-CM | POA: Diagnosis not present

## 2017-07-12 ENCOUNTER — Other Ambulatory Visit: Payer: Self-pay | Admitting: Internal Medicine

## 2017-08-05 ENCOUNTER — Other Ambulatory Visit: Payer: Self-pay | Admitting: Internal Medicine

## 2017-08-05 NOTE — Telephone Encounter (Signed)
Done erx 

## 2017-08-05 NOTE — Telephone Encounter (Signed)
Last filled 04/22/2017 120#

## 2017-08-27 DIAGNOSIS — R05 Cough: Secondary | ICD-10-CM | POA: Diagnosis not present

## 2017-08-27 DIAGNOSIS — J018 Other acute sinusitis: Secondary | ICD-10-CM | POA: Diagnosis not present

## 2017-09-09 ENCOUNTER — Encounter: Payer: Self-pay | Admitting: Anesthesiology

## 2017-09-09 DIAGNOSIS — D2262 Melanocytic nevi of left upper limb, including shoulder: Secondary | ICD-10-CM | POA: Diagnosis not present

## 2017-09-09 DIAGNOSIS — Z85828 Personal history of other malignant neoplasm of skin: Secondary | ICD-10-CM | POA: Diagnosis not present

## 2017-09-09 DIAGNOSIS — D2271 Melanocytic nevi of right lower limb, including hip: Secondary | ICD-10-CM | POA: Diagnosis not present

## 2017-09-09 DIAGNOSIS — L813 Cafe au lait spots: Secondary | ICD-10-CM | POA: Diagnosis not present

## 2017-09-09 DIAGNOSIS — C44319 Basal cell carcinoma of skin of other parts of face: Secondary | ICD-10-CM | POA: Diagnosis not present

## 2017-09-09 DIAGNOSIS — D2261 Melanocytic nevi of right upper limb, including shoulder: Secondary | ICD-10-CM | POA: Diagnosis not present

## 2017-09-09 DIAGNOSIS — D485 Neoplasm of uncertain behavior of skin: Secondary | ICD-10-CM | POA: Diagnosis not present

## 2017-09-09 DIAGNOSIS — D225 Melanocytic nevi of trunk: Secondary | ICD-10-CM | POA: Diagnosis not present

## 2017-09-09 DIAGNOSIS — L821 Other seborrheic keratosis: Secondary | ICD-10-CM | POA: Diagnosis not present

## 2017-09-30 ENCOUNTER — Other Ambulatory Visit (INDEPENDENT_AMBULATORY_CARE_PROVIDER_SITE_OTHER): Payer: Medicare Other

## 2017-09-30 ENCOUNTER — Encounter: Payer: Self-pay | Admitting: Internal Medicine

## 2017-09-30 ENCOUNTER — Ambulatory Visit (INDEPENDENT_AMBULATORY_CARE_PROVIDER_SITE_OTHER): Payer: Medicare Other | Admitting: Internal Medicine

## 2017-09-30 VITALS — BP 162/98 | HR 91 | Temp 98.1°F | Ht 75.0 in | Wt 222.0 lb

## 2017-09-30 DIAGNOSIS — K529 Noninfective gastroenteritis and colitis, unspecified: Secondary | ICD-10-CM | POA: Diagnosis not present

## 2017-09-30 DIAGNOSIS — R739 Hyperglycemia, unspecified: Secondary | ICD-10-CM | POA: Diagnosis not present

## 2017-09-30 DIAGNOSIS — I10 Essential (primary) hypertension: Secondary | ICD-10-CM | POA: Diagnosis not present

## 2017-09-30 DIAGNOSIS — E785 Hyperlipidemia, unspecified: Secondary | ICD-10-CM | POA: Diagnosis not present

## 2017-09-30 LAB — HEPATIC FUNCTION PANEL
ALBUMIN: 4.3 g/dL (ref 3.5–5.2)
ALT: 31 U/L (ref 0–53)
AST: 26 U/L (ref 0–37)
Alkaline Phosphatase: 49 U/L (ref 39–117)
Bilirubin, Direct: 0.1 mg/dL (ref 0.0–0.3)
TOTAL PROTEIN: 7.5 g/dL (ref 6.0–8.3)
Total Bilirubin: 0.4 mg/dL (ref 0.2–1.2)

## 2017-09-30 LAB — URINALYSIS, ROUTINE W REFLEX MICROSCOPIC
BILIRUBIN URINE: NEGATIVE
Hgb urine dipstick: NEGATIVE
Ketones, ur: NEGATIVE
Leukocytes, UA: NEGATIVE
Nitrite: NEGATIVE
PH: 6.5 (ref 5.0–8.0)
RBC / HPF: NONE SEEN (ref 0–?)
Specific Gravity, Urine: <=1.005 — AB (ref 1.000–1.030)
Total Protein, Urine: NEGATIVE
UROBILINOGEN UA: 0.2 (ref 0.0–1.0)
Urine Glucose: NEGATIVE
WBC UA: NONE SEEN (ref 0–?)

## 2017-09-30 LAB — CBC WITH DIFFERENTIAL/PLATELET
Basophils Absolute: 0 10*3/uL (ref 0.0–0.1)
Basophils Relative: 0.6 % (ref 0.0–3.0)
EOS PCT: 5.2 % — AB (ref 0.0–5.0)
Eosinophils Absolute: 0.3 10*3/uL (ref 0.0–0.7)
HEMATOCRIT: 44.4 % (ref 39.0–52.0)
HEMOGLOBIN: 15.4 g/dL (ref 13.0–17.0)
Lymphocytes Relative: 26.5 % (ref 12.0–46.0)
Lymphs Abs: 1.4 10*3/uL (ref 0.7–4.0)
MCHC: 34.6 g/dL (ref 30.0–36.0)
MCV: 89.2 fl (ref 78.0–100.0)
Monocytes Absolute: 0.6 10*3/uL (ref 0.1–1.0)
Monocytes Relative: 10.7 % (ref 3.0–12.0)
Neutro Abs: 2.9 10*3/uL (ref 1.4–7.7)
Neutrophils Relative %: 57 % (ref 43.0–77.0)
Platelets: 239 10*3/uL (ref 150.0–400.0)
RBC: 4.98 Mil/uL (ref 4.22–5.81)
RDW: 13.1 % (ref 11.5–15.5)
WBC: 5.1 10*3/uL (ref 4.0–10.5)

## 2017-09-30 LAB — LIPID PANEL
Cholesterol: 209 mg/dL — ABNORMAL HIGH (ref 0–200)
HDL: 55.8 mg/dL (ref >39.00)
Total CHOL/HDL Ratio: 4
Triglycerides: 471 mg/dL — ABNORMAL HIGH (ref 0.0–149.0)

## 2017-09-30 LAB — TSH: TSH: 2.02 u[IU]/mL (ref 0.35–4.50)

## 2017-09-30 LAB — BASIC METABOLIC PANEL
BUN: 18 mg/dL (ref 6–23)
CALCIUM: 9.8 mg/dL (ref 8.4–10.5)
CO2: 26 mEq/L (ref 19–32)
Chloride: 104 mEq/L (ref 96–112)
Creatinine, Ser: 0.99 mg/dL (ref 0.40–1.50)
GFR: 78.68 mL/min (ref >60.00)
GLUCOSE: 112 mg/dL — AB (ref 70–99)
Potassium: 4 mEq/L (ref 3.5–5.1)
SODIUM: 138 meq/L (ref 135–145)

## 2017-09-30 LAB — HEMOGLOBIN A1C: Hgb A1c MFr Bld: 6 % (ref 4.6–6.5)

## 2017-09-30 LAB — LDL CHOLESTEROL, DIRECT: Direct LDL: 109 mg/dL

## 2017-09-30 MED ORDER — MONTELUKAST SODIUM 10 MG PO TABS: 10.0000 mg | ORAL_TABLET | Freq: Every day | ORAL | 3 refills | Status: DC

## 2017-09-30 MED ORDER — ATENOLOL 25 MG PO TABS: 50.0000 mg | ORAL_TABLET | Freq: Every day | ORAL | 3 refills | Status: DC

## 2017-09-30 MED ORDER — ATORVASTATIN CALCIUM 40 MG PO TABS: 40.0000 mg | ORAL_TABLET | Freq: Every day | ORAL | 3 refills | Status: DC

## 2017-09-30 MED ORDER — BISMUTH SUBSALICYLATE 262 MG/15ML PO SUSP: 30.0000 mL | ORAL | 0 refills | Status: DC | PRN

## 2017-09-30 MED ORDER — FLUTICASONE PROPIONATE 50 MCG/ACT NA SUSP: 2.0000 | Freq: Every day | NASAL | 3 refills | Status: DC

## 2017-09-30 MED ORDER — LOSARTAN POTASSIUM 100 MG PO TABS: 100.0000 mg | ORAL_TABLET | Freq: Every day | ORAL | 3 refills | Status: DC

## 2017-09-30 NOTE — Patient Instructions (Signed)
Please continue all other medications as before, and refills have been done if requested.  Please have the pharmacy call with any other refills you may need.  Please continue your efforts at being more active, low cholesterol diet, and weight control.  You are otherwise up to date with prevention measures today.  Please keep your appointments with your specialists as you may have planned - urology soon  Please go to the LAB in the Basement (turn left off the elevator) for the tests to be done today  You will be contacted by phone if any changes need to be made immediately.  Otherwise, you will receive a letter about your results with an explanation, but please check with MyChart first.  Please remember to sign up for MyChart if you have not done so, as this will be important to you in the future with finding out test results, communicating by private email, and scheduling acute appointments online when needed.  Please return in 1 year for your yearly visit, or sooner if needed

## 2017-09-30 NOTE — Progress Notes (Signed)
Subjective:    Patient ID: Troy [name], male    DOB: 12-03-1945, 72 y.o.   MRN: [id number]  HPI  Here for yearly f/u;  Overall doing ok;  Pt denies Chest pain, worsening SOB, DOE, wheezing, orthopnea, PND, worsening LE edema, palpitations, dizziness or syncope.  Pt denies neurological change such as new headache, facial or extremity weakness.  Pt denies polydipsia, polyuria, or low sugar symptoms. Pt states overall good compliance with treatment and medications, good tolerability, and has been trying to follow appropriate diet.  Pt denies worsening depressive symptoms, suicidal ideation or panic. No fever, night sweats, wt loss, loss of appetite, or other constitutional symptoms.  Pt states good ability with ADL's, has low fall risk, home safety reviewed and adequate, no other significant changes in hearing or vision, and only occasionally active with exercise. BP at home a regular basis with 130/80, he is convinced is ok.  Wt is increased on purpose per pt b/c family told him he looked to thin. Wt Readings from Last 3 Encounters:  09/30/17 222 lb (100.7 kg)  10/13/16 215 lb (97.5 kg)  09/29/16 215 lb (97.5 kg)   BP Readings from Last 3 Encounters:  09/30/17 (!) 162/98  10/13/16 (!) 144/84  09/29/16 (!) 142/82  Has seen urology \\and  has f/u appt soon, last PSA has been down to about 1 so he is not worried, as well as ortho for right knee, putting off surgury for knee though has known ACL tear and cartilage damage, but he does ok with golf, just avoids waterskiing.  Had recent benign nevus removed.  Also has known left face basal cell for Moh's soon.    Hsa right TMJ per dentist per pt recently, for ibuprofen in daytime and muscle relaxer at night for 2 wks  No other interval hx or new complaint Past Medical History:  Diagnosis Date  . ALLERGIC RHINITIS 05/04/2007  . BACK PAIN, LUMBAR 02/17/2010  . CHEST PAIN 07/15/2009  . CONSTIPATION 02/17/2010  . Heart murmur    remote hx of murmur  .  HYPERLIPIDEMIA 05/04/2007  . HYPERTENSION 05/04/2007  . Interstitial cystitis 08/30/2013  . Lumbar disc disease 10/01/2011  . NECK PAIN, ACUTE 07/15/2009  . Prostate enlargement   . PSVT 05/04/2007  . Skin cancer 08/30/2013   Basal cell x 2, dec 2014  . Spermatocele   . VERTIGO 05/04/2007   Past Surgical History:  Procedure Laterality Date  . APPENDECTOMY    . KNEE ARTHROSCOPY  30 yrs ago  . s/p dental implant left upper jaw  11/09   also rt lower jaw  . s/p RK surgery bilat    . SPERMATOCELECTOMY  12/04/2011   Procedure: SPERMATOCELECTOMY;  Surgeon: Hanley Ben, MD;  Location: [facility];  Service: Urology;  Laterality: Left;  1 hour requested for this case   . TONSILLECTOMY      reports that he has never smoked. He has never used smokeless tobacco. He reports that he does not drink alcohol or use drugs. family history includes Cancer in his mother; Dementia in his mother; Diabetes in his other; Heart disease in his other; Hyperlipidemia in his other; Hypertension in his mother. Allergies  Allergen Reactions  . Celebrex [Celecoxib] Anaphylaxis    Oral sores  . Cyclobenzaprine Hcl     REACTION: faical skin rash, 'hard cysts'  . Mobic [Meloxicam] Other (See Comments)    Sores in mouth   Current Outpatient Medications on File Prior to Visit  Medication Sig Dispense Refill  . aspirin 81 MG EC tablet Take 81 mg by mouth daily.      . Probiotic Product (PROBIOTIC PO) Take by mouth daily.      . traMADol (ULTRAM) 50 MG tablet TAKE 1 TABLET BY MOUTH EVERY 6 HOURS AS NEEDED FOR SEVERE PAIN 120 tablet 0  . TURMERIC PO Take by mouth 2 (two) times daily.     No current facility-administered medications on file prior to visit.    Review of Systems  Constitutional: Negative for other unusual diaphoresis or sweats HENT: Negative for ear discharge or swelling Eyes: Negative for other worsening visual disturbances Respiratory: Negative for stridor or other swelling    Gastrointestinal: Negative for worsening distension or other blood Genitourinary: Negative for retention or other urinary change Musculoskeletal: Negative for other MSK pain or swelling Skin: Negative for color change or other new lesions Neurological: Negative for worsening tremors and other numbness  Psychiatric/Behavioral: Negative for worsening agitation or other fatigue All other system neg per pt    Objective:   Physical Exam BP (!) 162/98   Pulse 91   Temp 98.1 F (36.7 C) (Oral)   Ht 6\' 3"  (1.905 m)   Wt 222 lb (100.7 kg)   SpO2 96%   BMI 27.75 kg/m  VS noted,  Constitutional: Pt appears in NAD HENT: Head: NCAT.  Right Ear: External ear normal.  Left Ear: External ear normal.  Eyes: . Pupils are equal, round, and reactive to light. Conjunctivae and EOM are normal Nose: without d/c or deformity Neck: Neck supple. Gross normal ROM Cardiovascular: Normal rate and regular rhythm.   Pulmonary/Chest: Effort normal and breath sounds without rales or wheezing.  Abd:  Soft, NT, ND, + BS, no organomegaly Neurological: Pt is alert. At baseline orientation, motor grossly intact Skin: Skin is warm. No rashes, other new lesions, no LE edema Psychiatric: Pt behavior is normal without agitation  No other exam findings    Assessment & Plan:

## 2017-10-02 ENCOUNTER — Encounter: Payer: Self-pay | Admitting: Internal Medicine

## 2017-10-02 NOTE — Assessment & Plan Note (Signed)
stable overall by history and exam, recent data reviewed with pt, and pt to continue medical treatment as before,  to f/u any worsening symptoms or concerns Lab Results  Component Value Date   LDLCALC 100 (H) 09/30/2016

## 2017-10-02 NOTE — Assessment & Plan Note (Addendum)
Pt is adamant BP at home is well controlled, o/w stable overall by history and exam, recent data reviewed with pt, and pt to continue medical treatment as before,  to f/u any worsening symptoms or concerns BP Readings from Last 3 Encounters:  09/30/17 (!) 162/98  10/13/16 (!) 144/84  09/29/16 (!) 142/82

## 2017-10-02 NOTE — Assessment & Plan Note (Signed)
Lab Results  Component Value Date   HGBA1C 6.0 09/30/2017  stable overall by history and exam, recent data reviewed with pt, and pt to continue medical treatment as before,  to f/u any worsening symptoms or concerns

## 2017-10-11 DIAGNOSIS — C4401 Basal cell carcinoma of skin of lip: Secondary | ICD-10-CM | POA: Diagnosis not present

## 2017-10-11 DIAGNOSIS — Z85828 Personal history of other malignant neoplasm of skin: Secondary | ICD-10-CM | POA: Diagnosis not present

## 2017-10-13 ENCOUNTER — Ambulatory Visit (INDEPENDENT_AMBULATORY_CARE_PROVIDER_SITE_OTHER): Payer: Medicare Other | Admitting: *Deleted

## 2017-10-13 VITALS — BP 142/82 | HR 65 | Resp 18 | Ht 75.0 in | Wt 218.0 lb

## 2017-10-13 DIAGNOSIS — Z0000 Encounter for general adult medical examination without abnormal findings: Secondary | ICD-10-CM

## 2017-10-13 NOTE — Progress Notes (Addendum)
Subjective:   Troy [name] is a 72 y.o. male who presents for Medicare Annual/Subsequent preventive examination.  Review of Systems:  No ROS.  Medicare Wellness Visit. Additional risk factors are reflected in the social history.  Cardiac Risk Factors include: advanced age (>4men, >69 women);dyslipidemia;hypertension;male gender Sleep patterns: has restless sleep, gets up 2 times nightly to void and sleeps 7 hours nightly.    Home Safety/Smoke Alarms: Feels safe in home. Smoke alarms in place.  Living environment; residence and Firearm Safety: 2-story house, no firearms Lives with wife no needs for DME, good support system, . Seat Belt Safety/Bike Helmet: Wears seat belt.   PSA-  Lab Results  Component Value Date   PSA 3.24 09/30/2016   PSA 1.93 10/01/2011   PSA 1.53 10/22/2010       Objective:    Vitals: BP (!) 142/82   Pulse 65   Resp 18   Ht 6\' 3"  (1.905 m)   Wt 218 lb (98.9 kg)   SpO2 99%   BMI 27.25 kg/m   Body mass index is 27.25 kg/m.  Advanced Directives 10/13/2017 10/13/2016 08/29/2015 12/04/2011 12/01/2011  Does Patient Have a Medical Advance Directive? Yes Yes No Patient does not have advance directive;Patient would like information Patient does not have advance directive;Patient would like information  Type of Advance Directive [place];Living will [place];Living will - - -  Copy of [place] in Chart? No - copy requested No - copy requested - - -  Would patient like information on creating a medical advance directive? - - - Advance directive packet given Other (Comment)  Pre-existing out of facility DNR order (yellow form or pink MOST form) - - - No -    Tobacco Social History   Tobacco Use  Smoking Status Never Smoker  Smokeless Tobacco Never Used     Counseling given: Not Answered  Past Medical History:  Diagnosis Date  . ALLERGIC RHINITIS 05/04/2007  . BACK PAIN, LUMBAR 02/17/2010  .  CHEST PAIN 07/15/2009  . CONSTIPATION 02/17/2010  . Heart murmur    remote hx of murmur  . HYPERLIPIDEMIA 05/04/2007  . HYPERTENSION 05/04/2007  . Interstitial cystitis 08/30/2013  . Lumbar disc disease 10/01/2011  . NECK PAIN, ACUTE 07/15/2009  . Prostate enlargement   . PSVT 05/04/2007  . Skin cancer 08/30/2013   Basal cell x 2, dec 2014  . Spermatocele   . VERTIGO 05/04/2007   Past Surgical History:  Procedure Laterality Date  . APPENDECTOMY    . KNEE ARTHROSCOPY  30 yrs ago  . s/p dental implant left upper jaw  11/09   also rt lower jaw  . s/p RK surgery bilat    . SPERMATOCELECTOMY  12/04/2011   Procedure: SPERMATOCELECTOMY;  Surgeon: Hanley Ben, MD;  Location: [facility];  Service: Urology;  Laterality: Left;  1 hour requested for this case   . TONSILLECTOMY     Family History  Problem Relation Age of Onset  . Cancer Mother        breast cancer  . Dementia Mother   . Hypertension Mother   . Hyperlipidemia Other   . Diabetes Other   . Heart disease Other    Social History   Socioeconomic History  . Marital status: Married    Spouse name: Not on file  . Number of children: Not on file  . Years of education: Not on file  . Highest education level: Not  on file  Occupational History  . Occupation: retired 18 yrs [employer]	[place]  Social Needs  . Financial resource strain: Not hard at all  . Food insecurity:    Worry: Never true    Inability: Never true  . Transportation needs:    Medical: No    Non-medical: No  Tobacco Use  . Smoking status: Never Smoker  . Smokeless tobacco: Never Used  Substance and Sexual Activity  . Alcohol use: No    Alcohol/week: 0.0 oz  . Drug use: No  . Sexual activity: Not on file  Lifestyle  . Physical activity:    Days per week: 4 days    Minutes per session: 70 min  . Stress: Not at all  Relationships  . Social connections:    Talks on phone: More than three times a week    Gets  together: More than three times a week    Attends religious service: More than 4 times per year    Active member of club or organization: Yes    Attends meetings of clubs or organizations: More than 4 times per year    Relationship status: Married  Other Topics Concern  . Not on file  Social History Narrative  . Not on file    Outpatient Encounter Medications as of 10/13/2017  Medication Sig  . aspirin 81 MG EC tablet Take 81 mg by mouth daily.    Marland Kitchen atenolol (TENORMIN) 25 MG tablet Take 2 tablets (50 mg total) by mouth daily.  Marland Kitchen atorvastatin (LIPITOR) 40 MG tablet Take 1 tablet (40 mg total) by mouth daily at 6 PM.  . bismuth subsalicylate (PEPTO BISMOL) 262 MG/15ML suspension Take 30 mLs by mouth every 4 (four) hours as needed for diarrhea or loose stools.  . fluticasone (FLONASE) 50 MCG/ACT nasal spray Place 2 sprays into both nostrils daily.  Marland Kitchen losartan (COZAAR) 100 MG tablet Take 1 tablet (100 mg total) by mouth daily.  . montelukast (SINGULAIR) 10 MG tablet Take 1 tablet (10 mg total) by mouth daily.  . Probiotic Product (PROBIOTIC PO) Take by mouth daily.    . traMADol (ULTRAM) 50 MG tablet TAKE 1 TABLET BY MOUTH EVERY 6 HOURS AS NEEDED FOR SEVERE PAIN  . TURMERIC PO Take by mouth 2 (two) times daily.   No facility-administered encounter medications on file as of 10/13/2017.     Activities of Daily Living In your present state of health, do you have any difficulty performing the following activities: 10/13/2017 10/13/2016  Hearing? N N  Vision? N N  Difficulty concentrating or making decisions? N N  Walking or climbing stairs? N N  Dressing or bathing? N N  Doing errands, shopping? N N  Preparing Food and eating ? N N  Using the Toilet? N N  In the past six months, have you accidently leaked urine? Y Y  Do you have problems with loss of bowel control? N N  Managing your Medications? N N  Managing your Finances? N N  Housekeeping or managing your Housekeeping? N N  Some  recent data might be hidden    Patient Care Team: Biagio Borg, MD as PCP - Gaylyn Cheers, MD as Consulting Physician (Dermatology) Latanya Maudlin, MD as Consulting Physician (Orthopedic Surgery)   Assessment:   This is a routine wellness examination for Troy [name]. Physical assessment deferred to PCP.   Exercise Activities and Dietary recommendations Current Exercise Habits: Home exercise routine, Type of exercise: walking;stretching;strength training/weights;calisthenics(golf),  Time (Minutes): 50, Frequency (Times/Week): 5, Weekly Exercise (Minutes/Week): 250, Intensity: Mild  Diet (meal preparation, eat out, water intake, caffeinated beverages, dairy products, fruits and vegetables): in general, a "healthy" diet  , well balanced, eats a variety of fruits and vegetables daily, limits salt, fat/cholesterol, sugar,carbohydrates,caffeine, drinks 6-8 glasses of water daily.      Goals    . Exercise 150 minutes per week (moderate activity)     Wants to increase exercise;  Play golf 3 times; take one afternoon; walk at least 1 hour;     . Patient Stated     I want increase my cardiovascular exercise by playing golf and walking my dogs briskly. Enjoy life and family.       Fall Risk Fall Risk  10/13/2017 09/30/2017 10/13/2016 09/29/2016 08/29/2015  Falls in the past year? Yes Yes Yes No No  Number falls in past yr: 1 1 - - -  Comment - - - - -  Injury with Fall? - No - - -  Follow up Education provided;Falls prevention discussed - - - -   Depression Screen PHQ 2/9 Scores 10/13/2017 09/30/2017 10/13/2016 09/29/2016  PHQ - 2 Score 0 0 0 0    Cognitive Function       Ad8 score reviewed for issues:  Issues making decisions: no  Less interest in hobbies / activities: no  Repeats questions, stories (family complaining): no  Trouble using ordinary gadgets (microwave, computer, phone):no  Forgets the month or year: no  Mismanaging finances: no  Remembering appts: no  Daily  problems with thinking and/or memory: no Ad8 score is= 0  Immunization History  Administered Date(s) Administered  . H1N1 04/10/2008  . Influenza Whole 05/04/2007, 03/08/2009, 02/17/2010, 01/07/2011  . Influenza, High Dose Seasonal PF 01/29/2014  . Pneumococcal Conjugate-13 08/30/2013  . Pneumococcal Polysaccharide-23 11/25/2006, 08/29/2015  . Td 04/10/2008  . Zoster 04/10/2008   Screening Tests Health Maintenance  Topic Date Due  . INFLUENZA VACCINE  12/02/2017  . COLONOSCOPY  01/24/2018  . TETANUS/TDAP  04/10/2018  . Hepatitis C Screening  Completed  . PNA vac Low Risk Adult  Completed        Plan:    I have personally reviewed and noted the following in the patient's chart:   . Medical and social history . Use of alcohol, tobacco or illicit drugs  . Current medications and supplements . Functional ability and status . Nutritional status . Physical activity . Advanced directives . List of other physicians . Vitals . Screenings to include cognitive, depression, and falls . Referrals and appointments  In addition, I have reviewed and discussed with patient certain preventive protocols, quality metrics, and best practice recommendations. A written personalized care plan for preventive services as well as general preventive health recommendations were provided to patient.     Michiel Cowboy, RN  10/13/2017  Medical screening examination/treatment/procedure(s) were performed by non-physician practitioner and as supervising physician I was immediately available for consultation/collaboration. I agree with above. Cathlean Cower, MD

## 2017-10-13 NOTE — Patient Instructions (Addendum)
Continue doing brain stimulating activities (puzzles, reading, adult coloring books, staying active) to keep memory sharp.   Continue to eat heart healthy diet (full of fruits, vegetables, whole grains, lean protein, water--limit salt, fat, and sugar intake) and increase physical activity as tolerated.   [name] , Thank you for taking time to come for your Medicare Wellness Visit. I appreciate your ongoing commitment to your health goals. Please review the following plan we discussed and let me know if I can assist you in the future.   These are the goals we discussed: Goals    . Exercise 150 minutes per week (moderate activity)     Wants to increase exercise;  Play golf 3 times; take one afternoon; walk at least 1 hour;     . Patient Stated     I want increase my cardiovascular exercise by playing golf and walking my dogs briskly. Enjoy life and family.       This is a list of the screening recommended for you and due dates:  Health Maintenance  Topic Date Due  . Flu Shot  12/02/2017  . Colon Cancer Screening  01/24/2018  . Tetanus Vaccine  04/10/2018  .  Hepatitis C: One time screening is recommended by Center for Disease Control  (CDC) for  adults born from [birth year] through 1966.   Completed  . Pneumonia vaccines  Completed     Health Maintenance, Male A healthy lifestyle and preventive care is important for your health and wellness. Ask your health care provider about what schedule of regular examinations is right for you. What should I know about weight and diet? Eat a Healthy Diet  Eat plenty of vegetables, fruits, whole grains, low-fat dairy products, and lean protein.  Do not eat a lot of foods high in solid fats, added sugars, or salt.  Maintain a Healthy Weight Regular exercise can help you achieve or maintain a healthy weight. You should:  Do at least 150 minutes of exercise each week. The exercise should increase your heart rate and make you sweat  (moderate-intensity exercise).  Do strength-training exercises at least twice a week.  Watch Your Levels of Cholesterol and Blood Lipids  Have your blood tested for lipids and cholesterol every 5 years starting at 73 years of age. If you are at high risk for heart disease, you should start having your blood tested when you are 73 years old. You may need to have your cholesterol levels checked more often if: ? Your lipid or cholesterol levels are high. ? You are older than 73 years of age. ? You are at high risk for heart disease.  What should I know about cancer screening? Many types of cancers can be detected early and may often be prevented. Lung Cancer  You should be screened every year for lung cancer if: ? You are a current smoker who has smoked for at least 30 years. ? You are a former smoker who has quit within the past 15 years.  Talk to your health care provider about your screening options, when you should start screening, and how often you should be screened.  Colorectal Cancer  Routine colorectal cancer screening usually begins at 73 years of age and should be repeated every 5-10 years until you are 73 years old. You may need to be screened more often if early forms of precancerous polyps or small growths are found. Your health care provider may recommend screening at an earlier age if you have risk  factors for colon cancer.  Your health care provider may recommend using home test kits to check for hidden blood in the stool.  A small camera at the end of a tube can be used to examine your colon (sigmoidoscopy or colonoscopy). This checks for the earliest forms of colorectal cancer.  Prostate and Testicular Cancer  Depending on your age and overall health, your health care provider may do certain tests to screen for prostate and testicular cancer.  Talk to your health care provider about any symptoms or concerns you have about testicular or prostate cancer.  Skin  Cancer  Check your skin from head to toe regularly.  Tell your health care provider about any new moles or changes in moles, especially if: ? There is a change in a mole's size, shape, or color. ? You have a mole that is larger than a pencil eraser.  Always use sunscreen. Apply sunscreen liberally and repeat throughout the day.  Protect yourself by wearing long sleeves, pants, a wide-brimmed hat, and sunglasses when outside.  What should I know about heart disease, diabetes, and high blood pressure?  If you are 52-41 years of age, have your blood pressure checked every 3-5 years. If you are 66 years of age or older, have your blood pressure checked every year. You should have your blood pressure measured twice-once when you are at a hospital or clinic, and once when you are not at a hospital or clinic. Record the average of the two measurements. To check your blood pressure when you are not at a hospital or clinic, you can use: ? An automated blood pressure machine at a pharmacy. ? A home blood pressure monitor.  Talk to your health care provider about your target blood pressure.  If you are between 77-70 years old, ask your health care provider if you should take aspirin to prevent heart disease.  Have regular diabetes screenings by checking your fasting blood sugar level. ? If you are at a normal weight and have a low risk for diabetes, have this test once every three years after the age of 39. ? If you are overweight and have a high risk for diabetes, consider being tested at a younger age or more often.  A one-time screening for abdominal aortic aneurysm (AAA) by ultrasound is recommended for men aged 72-75 years who are current or former smokers. What should I know about preventing infection? Hepatitis B If you have a higher risk for hepatitis B, you should be screened for this virus. Talk with your health care provider to find out if you are at risk for hepatitis B  infection. Hepatitis C Blood testing is recommended for:  Everyone born from [birth year] through 1966.  Anyone with known risk factors for hepatitis C.  Sexually Transmitted Diseases (STDs)  You should be screened each year for STDs including gonorrhea and chlamydia if: ? You are sexually active and are younger than 73 years of age. ? You are older than 73 years of age and your health care provider tells you that you are at risk for this type of infection. ? Your sexual activity has changed since you were last screened and you are at an increased risk for chlamydia or gonorrhea. Ask your health care provider if you are at risk.  Talk with your health care provider about whether you are at high risk of being infected with HIV. Your health care provider may recommend a prescription medicine to help prevent HIV infection.  What else can I do?  Schedule regular health, dental, and eye exams.  Stay current with your vaccines (immunizations).  Do not use any tobacco products, such as cigarettes, chewing tobacco, and e-cigarettes. If you need help quitting, ask your health care provider.  Limit alcohol intake to no more than 2 drinks per day. One drink equals 12 ounces of beer, 5 ounces of Christina Gintz, or 1 ounces of hard liquor.  Do not use street drugs.  Do not share needles.  Ask your health care provider for help if you need support or information about quitting drugs.  Tell your health care provider if you often feel depressed.  Tell your health care provider if you have ever been abused or do not feel safe at home. This information is not intended to replace advice given to you by your health care provider. Make sure you discuss any questions you have with your health care provider. Document Released: 10/17/2007 Document Revised: 12/18/2015 Document Reviewed: 01/22/2015 Elsevier Interactive Patient Education  [employer].

## 2017-10-14 ENCOUNTER — Other Ambulatory Visit: Payer: Self-pay | Admitting: Internal Medicine

## 2017-11-01 ENCOUNTER — Other Ambulatory Visit: Payer: Self-pay | Admitting: Internal Medicine

## 2017-11-02 MED ORDER — TRAMADOL HCL 50 MG PO TABS: ORAL_TABLET | ORAL | 0 refills | Status: DC

## 2017-11-14 ENCOUNTER — Encounter: Payer: Self-pay | Admitting: Internal Medicine

## 2017-11-15 MED ORDER — TRAMADOL HCL 50 MG PO TABS: ORAL_TABLET | ORAL | 0 refills | Status: DC

## 2017-11-15 NOTE — Telephone Encounter (Signed)
Don erx 

## 2017-12-27 DIAGNOSIS — N401 Enlarged prostate with lower urinary tract symptoms: Secondary | ICD-10-CM | POA: Diagnosis not present

## 2017-12-31 DIAGNOSIS — R351 Nocturia: Secondary | ICD-10-CM | POA: Diagnosis not present

## 2017-12-31 DIAGNOSIS — N401 Enlarged prostate with lower urinary tract symptoms: Secondary | ICD-10-CM | POA: Diagnosis not present

## 2018-02-08 DIAGNOSIS — M3501 Sicca syndrome with keratoconjunctivitis: Secondary | ICD-10-CM | POA: Diagnosis not present

## 2018-02-08 DIAGNOSIS — H43813 Vitreous degeneration, bilateral: Secondary | ICD-10-CM | POA: Diagnosis not present

## 2018-02-08 DIAGNOSIS — H04123 Dry eye syndrome of bilateral lacrimal glands: Secondary | ICD-10-CM | POA: Diagnosis not present

## 2018-02-08 DIAGNOSIS — D3131 Benign neoplasm of right choroid: Secondary | ICD-10-CM | POA: Diagnosis not present

## 2018-02-22 ENCOUNTER — Ambulatory Visit (INDEPENDENT_AMBULATORY_CARE_PROVIDER_SITE_OTHER): Payer: Medicare Other

## 2018-02-22 DIAGNOSIS — Z23 Encounter for immunization: Secondary | ICD-10-CM

## 2018-03-01 ENCOUNTER — Other Ambulatory Visit: Payer: Self-pay | Admitting: Internal Medicine

## 2018-03-01 NOTE — Telephone Encounter (Signed)
Will give one month refill- #40/ not #120;

## 2018-03-01 NOTE — Telephone Encounter (Signed)
   LOV:09/30/17 NextOV:not scheduled Last Filled/Quantity:11/15/17 120#

## 2018-03-02 ENCOUNTER — Encounter: Payer: Self-pay | Admitting: Gastroenterology

## 2018-03-23 ENCOUNTER — Encounter: Payer: Self-pay | Admitting: Gastroenterology

## 2018-04-04 ENCOUNTER — Other Ambulatory Visit: Payer: Self-pay | Admitting: Family

## 2018-04-04 NOTE — Telephone Encounter (Signed)
   LOV:09/30/17 NextOV:n/s Last Filled/Quantity:03/01/18 40# Mickel Baas

## 2018-04-04 NOTE — Telephone Encounter (Signed)
Done erx 

## 2018-04-19 ENCOUNTER — Ambulatory Visit (INDEPENDENT_AMBULATORY_CARE_PROVIDER_SITE_OTHER): Payer: Medicare Other | Admitting: Gastroenterology

## 2018-04-19 ENCOUNTER — Encounter: Payer: Self-pay | Admitting: Gastroenterology

## 2018-04-19 VITALS — BP 126/80 | HR 72 | Ht 74.5 in | Wt 225.1 lb

## 2018-04-19 DIAGNOSIS — Z1211 Encounter for screening for malignant neoplasm of colon: Secondary | ICD-10-CM

## 2018-04-19 NOTE — Patient Instructions (Addendum)
Cologuard colon cancer screening test.  Your provider has ordered Cologuard testing as an option for colon cancer screening. This is performed by [employer] and may be out of network with your insurance. PRIOR to completing the test, it is YOUR responsibility to contact your insurance about covered benefits for this test. Your out of pocket expense could be anywhere from $0.00 to $649.00.   When you call to check coverage with your insurer, please provide the following information:   -The ONLY provider of Cologuard is [place] code for Cologuard is [phone].  [employer] NPI # [id number]  -[employer] Tax ID # [id number]   We have already sent your demographic and insurance information to [employer] (phone number [phone]) and they should contact you within the next week regarding your test. If you have not heard from them within the next week, please call our office at [phone].   Thank you for entrusting me with your care and choosing [facility].  Dr Ardis Hughs

## 2018-04-19 NOTE — Progress Notes (Signed)
HPI: This is a very pleasant 72 year old man who was referred to me by Biagio Borg, MD  to evaluate colon cancer screening.    Chief complaint is colon cancer screening  He is here today to discuss colon cancer screening options.  He has never personally had colon polyps.  Colon cancer does not run in his family.  He has no GI symptoms.  Specifically no change in his bowels, no bleeding, no significant abdominal pains.  Weight overall stable.   Old Data Reviewed:  Colonoscopy 2008 was a poor prep.  Colonoscopy 01/2008 for routine risk screening found hemorrhoids but was otherwise normal.  Recommended repeat colonoscopy for colon cancer screening at 10 years  CBC May 2019 was normal    Review of systems: Pertinent positive and negative review of systems were noted in the above HPI section. All other review negative.   Past Medical History:  Diagnosis Date  . ALLERGIC RHINITIS 05/04/2007  . BACK PAIN, LUMBAR 02/17/2010  . CHEST PAIN 07/15/2009  . CONSTIPATION 02/17/2010  . Heart murmur    remote hx of murmur  . HYPERLIPIDEMIA 05/04/2007  . HYPERTENSION 05/04/2007  . Interstitial cystitis 08/30/2013  . Lumbar disc disease 10/01/2011  . NECK PAIN, ACUTE 07/15/2009  . Prostate enlargement   . PSVT 05/04/2007  . Skin cancer 08/30/2013   Basal cell x 2, dec 2014  . Spermatocele   . VERTIGO 05/04/2007    Past Surgical History:  Procedure Laterality Date  . APPENDECTOMY    . KNEE ARTHROSCOPY  30 yrs ago  . s/p dental implant left upper jaw  11/09   also rt lower jaw  . s/p RK surgery bilat    . SPERMATOCELECTOMY  12/04/2011   Procedure: SPERMATOCELECTOMY;  Surgeon: Hanley Ben, MD;  Location: [facility];  Service: Urology;  Laterality: Left;  1 hour requested for this case   . TONSILLECTOMY      Current Outpatient Medications  Medication Sig Dispense Refill  . aspirin 81 MG EC tablet Take 81 mg by mouth daily.      Marland Kitchen atenolol (TENORMIN) 25 MG tablet  TAKE 2 TABLETS BY MOUTH EVERY DAY 180 tablet 0  . atorvastatin (LIPITOR) 40 MG tablet Take 1 tablet (40 mg total) by mouth daily at 6 PM. 90 tablet 3  . bismuth subsalicylate (PEPTO BISMOL) 262 MG/15ML suspension Take 30 mLs by mouth every 4 (four) hours as needed for diarrhea or loose stools. 360 mL 0  . fluticasone (FLONASE) 50 MCG/ACT nasal spray Place 2 sprays into both nostrils daily. 58 g 3  . losartan (COZAAR) 100 MG tablet Take 1 tablet (100 mg total) by mouth daily. 90 tablet 3  . montelukast (SINGULAIR) 10 MG tablet Take 1 tablet (10 mg total) by mouth daily. 90 tablet 3  . Probiotic Product (PROBIOTIC PO) Take by mouth daily.      . pseudoephedrine (SUDAFED) 30 MG tablet Take 30 mg by mouth every 4 (four) hours as needed for congestion.    . traMADol (ULTRAM) 50 MG tablet TAKE 1 TABLET BY MOUTH EVERY 6 HOURS AS NEEDED 60 tablet 2  . TURMERIC PO Take by mouth 2 (two) times daily.     No current facility-administered medications for this visit.     Allergies as of 04/19/2018 - Review Complete 04/19/2018  Allergen Reaction Noted  . Celebrex [celecoxib] Anaphylaxis 12/01/2011  . Cyclobenzaprine hcl  05/04/2007  . Mobic [meloxicam] Other (See Comments) 12/04/2011  Family History  Problem Relation Age of Onset  . Cancer Mother        breast cancer  . Dementia Mother   . Hypertension Mother   . Hyperlipidemia Other   . Diabetes Other   . Heart disease Other     Social History   Socioeconomic History  . Marital status: Married    Spouse name: Not on file  . Number of children: Not on file  . Years of education: Not on file  . Highest education level: Not on file  Occupational History  . Occupation: retired 27 yrs [employer]	[place]  Social Needs  . Financial resource strain: Not hard at all  . Food insecurity:    Worry: Never true    Inability: Never true  . Transportation needs:    Medical: No    Non-medical: No  Tobacco Use  . Smoking  status: Never Smoker  . Smokeless tobacco: Never Used  Substance and Sexual Activity  . Alcohol use: No    Alcohol/week: 0.0 standard drinks  . Drug use: No  . Sexual activity: Not on file  Lifestyle  . Physical activity:    Days per week: 4 days    Minutes per session: 70 min  . Stress: Not at all  Relationships  . Social connections:    Talks on phone: More than three times a week    Gets together: More than three times a week    Attends religious service: More than 4 times per year    Active member of club or organization: Yes    Attends meetings of clubs or organizations: More than 4 times per year    Relationship status: Married  . Intimate partner violence:    Fear of current or ex partner: No    Emotionally abused: No    Physically abused: No    Forced sexual activity: No  Other Topics Concern  . Not on file  Social History Narrative  . Not on file     Physical Exam: BP 126/80   Pulse 72   Ht 6' 2.5" (1.892 m)   Wt 225 lb 2 oz (102.1 kg)   BMI 28.52 kg/m  Constitutional: generally well-appearing Psychiatric: alert and oriented x3 Eyes: extraocular movements intact Mouth: oral pharynx moist, no lesions Neck: supple no lymphadenopathy Cardiovascular: heart regular rate and rhythm Lungs: clear to auscultation bilaterally Abdomen: soft, nontender, nondistended, no obvious ascites, no peritoneal signs, normal bowel sounds Extremities: no lower extremity edema bilaterally Skin: no lesions on visible extremities   Assessment and plan: 72 y.o. male with routine risk for colon cancer  He is at routine risk for colon cancer.  He has not had colon polyps and there is no family history of colon cancer.  We discussed several options for colon cancer screening and he decided to go with Cologuard stool based colon cancer screening test.  He understands that if this is positive that I would likely recommend a colonoscopy.  If it is negative then he would not need colon  cancer screening for 3 years.   Please see the "Patient Instructions" section for addition details about the plan.   Owens Loffler, MD [facility] 04/19/2018, 8:29 AM  Cc: Biagio Borg, MD

## 2018-04-28 DIAGNOSIS — Z1211 Encounter for screening for malignant neoplasm of colon: Secondary | ICD-10-CM | POA: Diagnosis not present

## 2018-04-28 DIAGNOSIS — Z1212 Encounter for screening for malignant neoplasm of rectum: Secondary | ICD-10-CM | POA: Diagnosis not present

## 2018-05-03 ENCOUNTER — Other Ambulatory Visit: Payer: Self-pay

## 2018-05-03 LAB — COLOGUARD: COLOGUARD: NEGATIVE

## 2018-05-11 DIAGNOSIS — J3489 Other specified disorders of nose and nasal sinuses: Secondary | ICD-10-CM | POA: Diagnosis not present

## 2018-05-11 DIAGNOSIS — R0981 Nasal congestion: Secondary | ICD-10-CM | POA: Diagnosis not present

## 2018-06-28 ENCOUNTER — Ambulatory Visit (INDEPENDENT_AMBULATORY_CARE_PROVIDER_SITE_OTHER): Payer: Medicare Other | Admitting: Family

## 2018-06-28 ENCOUNTER — Encounter: Payer: Self-pay | Admitting: Family

## 2018-06-28 ENCOUNTER — Other Ambulatory Visit (INDEPENDENT_AMBULATORY_CARE_PROVIDER_SITE_OTHER): Payer: Medicare Other

## 2018-06-28 VITALS — BP 126/76 | HR 80 | Temp 98.0°F | Ht 75.0 in | Wt 225.0 lb

## 2018-06-28 DIAGNOSIS — R109 Unspecified abdominal pain: Secondary | ICD-10-CM

## 2018-06-28 DIAGNOSIS — R1032 Left lower quadrant pain: Secondary | ICD-10-CM

## 2018-06-28 LAB — CBC WITH DIFFERENTIAL/PLATELET
BASOS ABS: 0 10*3/uL (ref 0.0–0.1)
Basophils Relative: 0.6 % (ref 0.0–3.0)
EOS ABS: 0.4 10*3/uL (ref 0.0–0.7)
Eosinophils Relative: 5.4 % — ABNORMAL HIGH (ref 0.0–5.0)
HCT: 45.4 % (ref 39.0–52.0)
Hemoglobin: 15.3 g/dL (ref 13.0–17.0)
Lymphocytes Relative: 32.7 % (ref 12.0–46.0)
Lymphs Abs: 2.2 10*3/uL (ref 0.7–4.0)
MCHC: 33.8 g/dL (ref 30.0–36.0)
MCV: 89.4 fl (ref 78.0–100.0)
Monocytes Absolute: 0.6 10*3/uL (ref 0.1–1.0)
Monocytes Relative: 9.2 % (ref 3.0–12.0)
Neutro Abs: 3.4 10*3/uL (ref 1.4–7.7)
Neutrophils Relative %: 52.1 % (ref 43.0–77.0)
PLATELETS: 217 10*3/uL (ref 150.0–400.0)
RBC: 5.08 Mil/uL (ref 4.22–5.81)
RDW: 13.3 % (ref 11.5–15.5)
WBC: 6.6 10*3/uL (ref 4.0–10.5)

## 2018-06-28 LAB — COMPREHENSIVE METABOLIC PANEL
ALT: 28 U/L (ref 0–53)
AST: 20 U/L (ref 0–37)
Albumin: 4.4 g/dL (ref 3.5–5.2)
Alkaline Phosphatase: 71 U/L (ref 39–117)
BUN: 19 mg/dL (ref 6–23)
CO2: 28 mEq/L (ref 19–32)
CREATININE: 1.04 mg/dL (ref 0.40–1.50)
Calcium: 9.7 mg/dL (ref 8.4–10.5)
Chloride: 104 mEq/L (ref 96–112)
GFR: 69.79 mL/min (ref >60.00)
Glucose, Bld: 86 mg/dL (ref 70–99)
Potassium: 4.2 mEq/L (ref 3.5–5.1)
Sodium: 140 mEq/L (ref 135–145)
Total Bilirubin: 0.5 mg/dL (ref 0.2–1.2)
Total Protein: 7.6 g/dL (ref 6.0–8.3)

## 2018-06-28 MED ORDER — CIPROFLOXACIN HCL 500 MG PO TABS: 500.0000 mg | ORAL_TABLET | Freq: Two times a day (BID) | ORAL | 0 refills | Status: DC

## 2018-06-28 MED ORDER — METRONIDAZOLE 500 MG PO TABS: 500.0000 mg | ORAL_TABLET | Freq: Three times a day (TID) | ORAL | 0 refills | Status: DC

## 2018-06-28 NOTE — Patient Instructions (Signed)

## 2018-06-28 NOTE — Progress Notes (Signed)
Troy [name] is a 73 y.o. male with the following history as recorded in Epic[employer]:  Patient Active Problem List   Diagnosis Date Noted  . Hyperglycemia 09/30/2017  . Interstitial cystitis 08/30/2013  . Skin cancer 08/30/2013  . Varicocele 10/04/2011  . Lumbar disc disease 10/01/2011  . Renal cyst 03/25/2011  . Preventative health care 10/22/2010  . Cervical spondylosis 10/22/2010  . Bladder neck obstruction 10/22/2010  . BACK PAIN, LUMBAR 02/17/2010  . Hyperlipidemia 05/04/2007  . Essential hypertension 05/04/2007  . PSVT 05/04/2007  . Allergic rhinitis 05/04/2007    Current Outpatient Medications  Medication Sig Dispense Refill  . aspirin 81 MG EC tablet Take 81 mg by mouth daily.      Marland Kitchen atenolol (TENORMIN) 25 MG tablet TAKE 2 TABLETS BY MOUTH EVERY DAY 180 tablet 0  . atorvastatin (LIPITOR) 40 MG tablet Take 1 tablet (40 mg total) by mouth daily at 6 PM. 90 tablet 3  . bismuth subsalicylate (PEPTO BISMOL) 262 MG/15ML suspension Take 30 mLs by mouth every 4 (four) hours as needed for diarrhea or loose stools. 360 mL 0  . fluticasone (FLONASE) 50 MCG/ACT nasal spray Place 2 sprays into both nostrils daily. 58 g 3  . losartan (COZAAR) 100 MG tablet Take 1 tablet (100 mg total) by mouth daily. 90 tablet 3  . montelukast (SINGULAIR) 10 MG tablet Take 1 tablet (10 mg total) by mouth daily. 90 tablet 3  . Probiotic Product (PROBIOTIC PO) Take by mouth daily.      . pseudoephedrine (SUDAFED) 30 MG tablet Take 30 mg by mouth every 4 (four) hours as needed for congestion.    Marland Kitchen tiZANidine (ZANAFLEX) 2 MG tablet TAKE 1 OR 2 BEFORE BED    . traMADol (ULTRAM) 50 MG tablet TAKE 1 TABLET BY MOUTH EVERY 6 HOURS AS NEEDED 60 tablet 2  . TURMERIC PO Take by mouth 2 (two) times daily.    . ciprofloxacin (CIPRO) 500 MG tablet Take 1 tablet (500 mg total) by mouth 2 (two) times daily. 20 tablet 0  . metroNIDAZOLE (FLAGYL) 500 MG tablet Take 1 tablet (500 mg total) by mouth 3 (three) times daily. 21  tablet 0   No current facility-administered medications for this visit.     Allergies: Celebrex [celecoxib]; Cyclobenzaprine hcl; and Mobic [meloxicam]  Past Medical History:  Diagnosis Date  . ALLERGIC RHINITIS 05/04/2007  . BACK PAIN, LUMBAR 02/17/2010  . CHEST PAIN 07/15/2009  . CONSTIPATION 02/17/2010  . Heart murmur    remote hx of murmur  . HYPERLIPIDEMIA 05/04/2007  . HYPERTENSION 05/04/2007  . Interstitial cystitis 08/30/2013  . Lumbar disc disease 10/01/2011  . NECK PAIN, ACUTE 07/15/2009  . Prostate enlargement   . PSVT 05/04/2007  . Skin cancer 08/30/2013   Basal cell x 2, dec 2014  . Spermatocele   . VERTIGO 05/04/2007    Past Surgical History:  Procedure Laterality Date  . APPENDECTOMY    . KNEE ARTHROSCOPY  30 yrs ago  . s/p dental implant left upper jaw  11/09   also rt lower jaw  . s/p RK surgery bilat    . SPERMATOCELECTOMY  12/04/2011   Procedure: SPERMATOCELECTOMY;  Surgeon: Hanley Ben, MD;  Location: [facility];  Service: Urology;  Laterality: Left;  1 hour requested for this case   . TONSILLECTOMY      Family History  Problem Relation Age of Onset  . Cancer Mother        breast cancer  .  Dementia Mother   . Hypertension Mother   . Hyperlipidemia Other   . Diabetes Other   . Heart disease Other     Social History   Tobacco Use  . Smoking status: Never Smoker  . Smokeless tobacco: Never Used  Substance Use Topics  . Alcohol use: No    Alcohol/week: 0.0 standard drinks    Subjective:  Patient presents with concerns for abdominal pain "on and off" x 3 weeks; has been noticing changes in his bowel movements- alternating between constipated and diarrhea; had Cologuard last year- normal; notes that symptoms have been localized in the LLQ; no fever; + nausea; no vomiting; no blood in stool/ no coffee grounds emesis; started Nexium for GERD symptoms about the time the symptoms started- has held the Nexium for the past few days but no  change in the symptoms. No concerns for weight loss; appetite has been down in the past 24 hours but typically appetite has been good;     Objective:  Vitals:   06/28/18 1402  BP: 126/76  Pulse: 80  Temp: 98 F (36.7 C)  TempSrc: Oral  SpO2: 96%  Weight: 225 lb (102.1 kg)  Height: '6\' 3"'$  (1.905 m)    General: Well developed, well nourished, in no acute distress  Skin : Warm and dry.  Head: Normocephalic and atraumatic  Eyes: Sclera and conjunctiva clear; pupils round and reactive to light; extraocular movements intact  Ears: External normal; canals clear; tympanic membranes normal  Oropharynx: Pink, supple. No suspicious lesions  Lungs: Respirations unlabored; clear to auscultation bilaterally without wheeze, rales, rhonchi  CVS exam: normal rate and regular rhythm.  Abdomen: Soft; LLQ tender; nondistended; normoactive bowel sounds; no masses or hepatosplenomegaly  Neurologic: Alert and oriented; speech intact; face symmetrical; moves all extremities well; CNII-XII intact without focal deficit   Assessment:  1. Abdominal pain, unspecified abdominal location   2. Left lower quadrant abdominal pain     Plan:  ? Diverticulitis; need to rule out abscess; update CBC, CMP; update abd/ pelvic CT- he will walk in to [place] tomorrow; start Flagyl and Cipro as prescribed; follow-up to be determined.  No follow-ups on file.  Orders Placed This Encounter  Procedures  . CT Abdomen Pelvis W Contrast    Standing Status:   Future    Standing Expiration Date:   09/26/2019    Order Specific Question:   If indicated for the ordered procedure, I authorize the administration of contrast media per Radiology protocol    Answer:   Yes    Order Specific Question:   Preferred imaging location?    Answer:   [facility]    Order Specific Question:   Is Oral Contrast requested for this exam?    Answer:   Yes, Per Radiology protocol    Order Specific Question:   Radiology Contrast  Protocol - do NOT remove file path    Answer:   [email or web address]  . CBC w/Diff    Standing Status:   Future    Number of Occurrences:   1    Standing Expiration Date:   06/28/2019  . Comp Met (CMET)    Standing Status:   Future    Number of Occurrences:   1    Standing Expiration Date:   06/28/2019    Requested Prescriptions   Signed Prescriptions Disp Refills  . ciprofloxacin (CIPRO) 500 MG tablet 20 tablet 0    Sig: Take 1 tablet (500 mg total) by mouth  2 (two) times daily.  . metroNIDAZOLE (FLAGYL) 500 MG tablet 21 tablet 0    Sig: Take 1 tablet (500 mg total) by mouth 3 (three) times daily.

## 2018-06-29 ENCOUNTER — Ambulatory Visit
Admission: RE | Admit: 2018-06-29 | Discharge: 2018-06-29 | Disposition: A | Discharge status: Home / Self Care | Payer: Medicare Other | Source: Ambulatory Visit | Attending: Family | Admitting: Family

## 2018-06-29 DIAGNOSIS — K4090 Unilateral inguinal hernia, without obstruction or gangrene, not specified as recurrent: Secondary | ICD-10-CM | POA: Diagnosis not present

## 2018-06-29 DIAGNOSIS — R1032 Left lower quadrant pain: Secondary | ICD-10-CM

## 2018-06-29 MED ORDER — IOPAMIDOL (ISOVUE-300) INJECTION 61%
100.0000 mL | Freq: Once | INTRAVENOUS | Status: AC | PRN
  Administered 2018-06-29: 100 mL via INTRAVENOUS

## 2018-07-08 ENCOUNTER — Telehealth: Payer: Self-pay

## 2018-07-08 NOTE — Telephone Encounter (Signed)
Copied from [place] [phone]. Topic: Referral - Request for Referral >> Jul 08, 2018  3:50 PM Bea Graff, NT wrote: Has patient seen PCP for this complaint? Yes.   *If NO, is insurance requiring patient see PCP for this issue before PCP can refer them? Referral for which specialty: surgeon Preferred provider/office: any Reason for referral: hernia

## 2018-07-08 NOTE — Telephone Encounter (Signed)
Need clarify - what type of hernia is he referring to?

## 2018-07-11 NOTE — Telephone Encounter (Signed)
Called pt, LVM for clarification on what type of hernia he has.

## 2018-07-11 NOTE — Telephone Encounter (Signed)
Pt says that he was told by Jodi Mourning that he has a bilateral hernia in his groin. Pt says that he came in to see Mickel Baas in Dr Jenny Reichmann absence.

## 2018-07-12 ENCOUNTER — Other Ambulatory Visit: Payer: Self-pay | Admitting: Family

## 2018-07-12 DIAGNOSIS — K4020 Bilateral inguinal hernia, without obstruction or gangrene, not specified as recurrent: Secondary | ICD-10-CM

## 2018-07-12 NOTE — Telephone Encounter (Signed)
Patient is ready for referral regarding his hernias.  Thanks

## 2018-07-13 ENCOUNTER — Encounter: Payer: Self-pay | Admitting: Internal Medicine

## 2018-08-11 ENCOUNTER — Ambulatory Visit (INDEPENDENT_AMBULATORY_CARE_PROVIDER_SITE_OTHER): Payer: Medicare Other | Admitting: Internal Medicine

## 2018-08-11 ENCOUNTER — Ambulatory Visit: Payer: Self-pay

## 2018-08-11 ENCOUNTER — Encounter: Payer: Self-pay | Admitting: Internal Medicine

## 2018-08-11 DIAGNOSIS — W57XXXA Bitten or stung by nonvenomous insect and other nonvenomous arthropods, initial encounter: Secondary | ICD-10-CM | POA: Diagnosis not present

## 2018-08-11 DIAGNOSIS — I10 Essential (primary) hypertension: Secondary | ICD-10-CM | POA: Diagnosis not present

## 2018-08-11 DIAGNOSIS — S70362A Insect bite (nonvenomous), left thigh, initial encounter: Secondary | ICD-10-CM

## 2018-08-11 DIAGNOSIS — R739 Hyperglycemia, unspecified: Secondary | ICD-10-CM | POA: Diagnosis not present

## 2018-08-11 MED ORDER — DOXYCYCLINE HYCLATE 100 MG PO TABS: 100.0000 mg | ORAL_TABLET | Freq: Two times a day (BID) | ORAL | 0 refills | Status: DC

## 2018-08-11 NOTE — Assessment & Plan Note (Signed)
stable overall by history and exam, recent data reviewed with pt, and pt to continue medical treatment as before,  to f/u any worsening symptoms or concerns  

## 2018-08-11 NOTE — Assessment & Plan Note (Signed)
Mild to mod, for antibx course,  to f/u any worsening symptoms or concerns 

## 2018-08-11 NOTE — Telephone Encounter (Signed)
Call placed to patient who wants to report a tick bite. He says he removed it from his right hip about 48 hours ago.  He now has a dime size bulls eye area. He says it is painful to touch. He denies fever and wide spread rash. Per protocol pt call was transferred to office for appointment. Pt was read care advice.  He verbalized  Understanding of all instructions.  Reason for Disposition . Red ring or bull's-eye rash occurs at tick bite  Answer Assessment - Initial Assessment Questions 1. TYPE of TICK: "Is it a wood tick or a deer tick?" If unsure, ask: "What size was the tick?" "Did it look more like a watermelon seed or a poppy seed?"      Poppy seed 2. LOCATION: "Where is the tick bite located?"     Rt hip 3. ONSET: "How long do you think the tick was attached before you removed it?" (Hours or days)     unsure 4. TETANUS: "When was the last tetanus booster?"      unsure 5. PREGNANCY: "Is there any chance you are pregnant?" "When was your last menstrual period?"    N/A  Protocols used: TICK BITE-A-AH

## 2018-08-11 NOTE — Progress Notes (Signed)
Patient ID: Troy [name], male   DOB: 1945-08-21, 73 y.o.   MRN: [id number]  Virtual Visit via Video Note  I connected with Troy [name] on 08/11/18 at  3:40 PM EDT by a video enabled telemedicine application and verified that I am speaking with the correct person using two identifiers. I am at office, pt is at home, wife is present   I discussed the limitations of evaluation and management by telemedicine and the availability of in person appointments. The patient expressed understanding and agreed to proceed.  History of Present Illness: Here to c/o 1 days onset tick bite, noticed yesterday and removed the tick, not sure long was on left lateral upper leg, but now has a 1 cm area red, swelling, tender and felt warm today.  Pt denies chest pain, increased sob or doe, wheezing, orthopnea, PND, increased LE swelling, palpitations, dizziness or syncope, and not overly fatigued.  Pt denies new neurological symptoms such as new headache, or facial or extremity weakness or numbness   Pt denies polydipsia, polyuria.  BP at home < 140/9 Past Medical History:  Diagnosis Date  . ALLERGIC RHINITIS 05/04/2007  . BACK PAIN, LUMBAR 02/17/2010  . CHEST PAIN 07/15/2009  . CONSTIPATION 02/17/2010  . Heart murmur    remote hx of murmur  . HYPERLIPIDEMIA 05/04/2007  . HYPERTENSION 05/04/2007  . Interstitial cystitis 08/30/2013  . Lumbar disc disease 10/01/2011  . NECK PAIN, ACUTE 07/15/2009  . Prostate enlargement   . PSVT 05/04/2007  . Skin cancer 08/30/2013   Basal cell x 2, dec 2014  . Spermatocele   . VERTIGO 05/04/2007   Past Surgical History:  Procedure Laterality Date  . APPENDECTOMY    . KNEE ARTHROSCOPY  30 yrs ago  . s/p dental implant left upper jaw  11/09   also rt lower jaw  . s/p RK surgery bilat    . SPERMATOCELECTOMY  12/04/2011   Procedure: SPERMATOCELECTOMY;  Surgeon: Hanley Ben, MD;  Location: [facility];  Service: Urology;  Laterality: Left;  1 hour requested  for this case   . TONSILLECTOMY      reports that he has never smoked. He has never used smokeless tobacco. He reports that he does not drink alcohol or use drugs. family history includes Cancer in his mother; Dementia in his mother; Diabetes in an other family member; Heart disease in an other family member; Hyperlipidemia in an other family member; Hypertension in his mother. Allergies  Allergen Reactions  . Celebrex [Celecoxib] Anaphylaxis    Oral sores  . Cyclobenzaprine Hcl     REACTION: faical skin rash, 'hard cysts'  . Mobic [Meloxicam] Other (See Comments)    Sores in mouth   Current Outpatient Medications on File Prior to Visit  Medication Sig Dispense Refill  . aspirin 81 MG EC tablet Take 81 mg by mouth daily.      Marland Kitchen atenolol (TENORMIN) 25 MG tablet TAKE 2 TABLETS BY MOUTH EVERY DAY 180 tablet 0  . atorvastatin (LIPITOR) 40 MG tablet Take 1 tablet (40 mg total) by mouth daily at 6 PM. 90 tablet 3  . bismuth subsalicylate (PEPTO BISMOL) 262 MG/15ML suspension Take 30 mLs by mouth every 4 (four) hours as needed for diarrhea or loose stools. 360 mL 0  . ciprofloxacin (CIPRO) 500 MG tablet Take 1 tablet (500 mg total) by mouth 2 (two) times daily. 20 tablet 0  . fluticasone (FLONASE) 50 MCG/ACT nasal spray Place 2 sprays into both  nostrils daily. 58 g 3  . losartan (COZAAR) 100 MG tablet Take 1 tablet (100 mg total) by mouth daily. 90 tablet 3  . metroNIDAZOLE (FLAGYL) 500 MG tablet Take 1 tablet (500 mg total) by mouth 3 (three) times daily. 21 tablet 0  . montelukast (SINGULAIR) 10 MG tablet Take 1 tablet (10 mg total) by mouth daily. 90 tablet 3  . Probiotic Product (PROBIOTIC PO) Take by mouth daily.      . pseudoephedrine (SUDAFED) 30 MG tablet Take 30 mg by mouth every 4 (four) hours as needed for congestion.    Marland Kitchen tiZANidine (ZANAFLEX) 2 MG tablet TAKE 1 OR 2 BEFORE BED    . traMADol (ULTRAM) 50 MG tablet TAKE 1 TABLET BY MOUTH EVERY 6 HOURS AS NEEDED 60 tablet 2  .  TURMERIC PO Take by mouth 2 (two) times daily.     No current facility-administered medications on file prior to visit.     Observations/Objective: Alert, mentating well, non toxic but mild fatigued, cn 2-12 intact, moves all 4s, resps normal, and tick bite site with 1 cm area red and swelling without drainage  Assessment and Plan: See notes  Follow Up Instructions: See notes   I discussed the assessment and treatment plan with the patient. The patient was provided an opportunity to ask questions and all were answered. The patient agreed with the plan and demonstrated an understanding of the instructions.   The patient was advised to call back or seek an in-person evaluation if the symptoms worsen or if the condition fails to improve as anticipated.   Troy Cower, MD

## 2018-08-11 NOTE — Patient Instructions (Signed)
Please take all new medication as prescribed - the antibiotic  Please continue all other medications as before, and refills have been done if requested.  Please have the pharmacy call with any other refills you may need.  Please continue your efforts at being more active, low cholesterol diet, and weight control.  Please keep your appointments with your specialists as you may have planned    

## 2018-08-11 NOTE — Telephone Encounter (Signed)
Virtual Visit has been made for today with Dr.John

## 2018-08-15 NOTE — Telephone Encounter (Signed)
Noted  

## 2018-09-13 ENCOUNTER — Other Ambulatory Visit: Payer: Self-pay | Admitting: Internal Medicine

## 2018-09-13 NOTE — Telephone Encounter (Signed)
Don erx 

## 2018-10-09 ENCOUNTER — Other Ambulatory Visit: Payer: Self-pay | Admitting: Internal Medicine

## 2018-10-25 DIAGNOSIS — L918 Other hypertrophic disorders of the skin: Secondary | ICD-10-CM | POA: Diagnosis not present

## 2018-10-25 DIAGNOSIS — L814 Other melanin hyperpigmentation: Secondary | ICD-10-CM | POA: Diagnosis not present

## 2018-10-25 DIAGNOSIS — D225 Melanocytic nevi of trunk: Secondary | ICD-10-CM | POA: Diagnosis not present

## 2018-10-25 DIAGNOSIS — D2239 Melanocytic nevi of other parts of face: Secondary | ICD-10-CM | POA: Diagnosis not present

## 2018-10-25 DIAGNOSIS — D2261 Melanocytic nevi of right upper limb, including shoulder: Secondary | ICD-10-CM | POA: Diagnosis not present

## 2018-10-25 DIAGNOSIS — L821 Other seborrheic keratosis: Secondary | ICD-10-CM | POA: Diagnosis not present

## 2018-10-25 DIAGNOSIS — D2262 Melanocytic nevi of left upper limb, including shoulder: Secondary | ICD-10-CM | POA: Diagnosis not present

## 2018-10-25 DIAGNOSIS — L738 Other specified follicular disorders: Secondary | ICD-10-CM | POA: Diagnosis not present

## 2018-10-25 DIAGNOSIS — D1801 Hemangioma of skin and subcutaneous tissue: Secondary | ICD-10-CM | POA: Diagnosis not present

## 2018-10-25 DIAGNOSIS — Z85828 Personal history of other malignant neoplasm of skin: Secondary | ICD-10-CM | POA: Diagnosis not present

## 2018-11-03 ENCOUNTER — Other Ambulatory Visit: Payer: Self-pay | Admitting: Internal Medicine

## 2018-11-03 NOTE — Telephone Encounter (Signed)
Done erx 

## 2018-12-07 ENCOUNTER — Other Ambulatory Visit: Payer: Self-pay | Admitting: Internal Medicine

## 2018-12-14 DIAGNOSIS — N400 Enlarged prostate without lower urinary tract symptoms: Secondary | ICD-10-CM | POA: Diagnosis not present

## 2018-12-19 DIAGNOSIS — R351 Nocturia: Secondary | ICD-10-CM | POA: Diagnosis not present

## 2018-12-19 DIAGNOSIS — R350 Frequency of micturition: Secondary | ICD-10-CM | POA: Diagnosis not present

## 2018-12-19 DIAGNOSIS — N401 Enlarged prostate with lower urinary tract symptoms: Secondary | ICD-10-CM | POA: Diagnosis not present

## 2018-12-20 NOTE — Progress Notes (Addendum)
Subjective:   Troy [name] is a 73 y.o. male who presents for Medicare Annual/Subsequent preventive examination. I connected with patient by a telephone and verified that I am speaking with the correct person using two identifiers. Patient stated full name and DOB. Patient gave permission to continue with telephonic visit. Patient's location was at home and Nurse's location was at [place] office.   Review of Systems:   Cardiac Risk Factors include: advanced age (>69men, >19 women);dyslipidemia;male gender;hypertension Sleep patterns: has interrupted sleep, gets up 2-3 times nightly to void and sleeps 5-6 hours nightly. Takes naps during the day.   Home Safety/Smoke Alarms: Feels safe in home. Smoke alarms in place.  Living environment; residence and Firearm Safety: 1-story house/ trailer, no firearms, firearms stored safely. Seat Belt Safety/Bike Helmet: Wears seat belt.       Objective:    Vitals: There were no vitals taken for this visit.  There is no height or weight on file to calculate BMI.  Advanced Directives 12/21/2018 10/13/2017 10/13/2016 08/29/2015 12/04/2011 12/01/2011  Does Patient Have a Medical Advance Directive? Yes Yes Yes No Patient does not have advance directive;Patient would like information Patient does not have advance directive;Patient would like information  Type of Advance Directive [place];Living will [place];Living will [place];Living will - - -  Copy of [place] in Chart? No - copy requested No - copy requested No - copy requested - - -  Would patient like information on creating a medical advance directive? - - - - Advance directive packet given Other (Comment)  Pre-existing out of facility DNR order (yellow form or pink MOST form) - - - - No -    Tobacco Social History   Tobacco Use  Smoking Status Never Smoker  Smokeless Tobacco Never Used     Counseling given: Not  Answered  Past Medical History:  Diagnosis Date  . ALLERGIC RHINITIS 05/04/2007  . BACK PAIN, LUMBAR 02/17/2010  . CHEST PAIN 07/15/2009  . CONSTIPATION 02/17/2010  . Heart murmur    remote hx of murmur  . HYPERLIPIDEMIA 05/04/2007  . HYPERTENSION 05/04/2007  . Interstitial cystitis 08/30/2013  . Lumbar disc disease 10/01/2011  . NECK PAIN, ACUTE 07/15/2009  . Prostate enlargement   . PSVT 05/04/2007  . Skin cancer 08/30/2013   Basal cell x 2, dec 2014  . Spermatocele   . VERTIGO 05/04/2007   Past Surgical History:  Procedure Laterality Date  . APPENDECTOMY    . KNEE ARTHROSCOPY  30 yrs ago  . s/p dental implant left upper jaw  11/09   also rt lower jaw  . s/p RK surgery bilat    . SPERMATOCELECTOMY  12/04/2011   Procedure: SPERMATOCELECTOMY;  Surgeon: Hanley Ben, MD;  Location: [facility];  Service: Urology;  Laterality: Left;  1 hour requested for this case   . TONSILLECTOMY     Family History  Problem Relation Age of Onset  . Cancer Mother        breast cancer  . Dementia Mother   . Hypertension Mother   . Hyperlipidemia Other   . Diabetes Other   . Heart disease Other    Social History   Socioeconomic History  . Marital status: Married    Spouse name: Not on file  . Number of children: 1  . Years of education: Not on file  . Highest education level: Not on file  Occupational History  .  Occupation: retired 12 yrs [employer]	[place]  Social Needs  . Financial resource strain: Not hard at all  . Food insecurity    Worry: Never true    Inability: Never true  . Transportation needs    Medical: No    Non-medical: No  Tobacco Use  . Smoking status: Never Smoker  . Smokeless tobacco: Never Used  Substance and Sexual Activity  . Alcohol use: No    Alcohol/week: 0.0 standard drinks  . Drug use: No  . Sexual activity: Not Currently  Lifestyle  . Physical activity    Days per week: 6 days    Minutes per session: 70 min   . Stress: Not at all  Relationships  . Social connections    Talks on phone: More than three times a week    Gets together: More than three times a week    Attends religious service: More than 4 times per year    Active member of club or organization: Yes    Attends meetings of clubs or organizations: More than 4 times per year    Relationship status: Married  Other Topics Concern  . Not on file  Social History Narrative  . Not on file    Outpatient Encounter Medications as of 12/21/2018  Medication Sig  . aspirin 81 MG EC tablet Take 81 mg by mouth daily.    Marland Kitchen atenolol (TENORMIN) 25 MG tablet Take 2 tablets by mouth once daily  . atorvastatin (LIPITOR) 40 MG tablet TAKE 1 TABLET BY MOUTH ONCE DAILY AT  6  PM  . bismuth subsalicylate (PEPTO BISMOL) 262 MG/15ML suspension Take 30 mLs by mouth every 4 (four) hours as needed for diarrhea or loose stools.  Marland Kitchen losartan (COZAAR) 100 MG tablet Take 1 tablet by mouth once daily  . montelukast (SINGULAIR) 10 MG tablet Take 1 tablet (10 mg total) by mouth daily.  . Probiotic Product (PROBIOTIC PO) Take by mouth daily.    . pseudoephedrine (SUDAFED) 30 MG tablet Take 30 mg by mouth every 4 (four) hours as needed for congestion.  . traMADol (ULTRAM) 50 MG tablet TAKE 1 TABLET BY MOUTH EVERY 6 HOURS AS NEEDED  . fluticasone (FLONASE) 50 MCG/ACT nasal spray Place 2 sprays into both nostrils daily.  . [DISCONTINUED] ciprofloxacin (CIPRO) 500 MG tablet Take 1 tablet (500 mg total) by mouth 2 (two) times daily. (Patient not taking: Reported on 12/21/2018)  . [DISCONTINUED] doxycycline (VIBRA-TABS) 100 MG tablet Take 1 tablet (100 mg total) by mouth 2 (two) times daily. (Patient not taking: Reported on 12/21/2018)  . [DISCONTINUED] metroNIDAZOLE (FLAGYL) 500 MG tablet Take 1 tablet (500 mg total) by mouth 3 (three) times daily. (Patient not taking: Reported on 12/21/2018)  . [DISCONTINUED] tiZANidine (ZANAFLEX) 2 MG tablet TAKE 1 OR 2 BEFORE BED  .  [DISCONTINUED] TURMERIC PO Take by mouth 2 (two) times daily.   No facility-administered encounter medications on file as of 12/21/2018.     Activities of Daily Living In your present state of health, do you have any difficulty performing the following activities: 12/21/2018  Hearing? N  Vision? N  Difficulty concentrating or making decisions? N  Walking or climbing stairs? N  Dressing or bathing? N  Doing errands, shopping? N  Preparing Food and eating ? N  Using the Toilet? N  In the past six months, have you accidently leaked urine? N  Do you have problems with loss of bowel control? N  Managing your Medications?  N  Managing your Finances? N  Housekeeping or managing your Housekeeping? N  Some recent data might be hidden    Patient Care Team: Biagio Borg, MD as PCP - Gaylyn Cheers, MD as Consulting Physician (Dermatology) Latanya Maudlin, MD as Consulting Physician (Orthopedic Surgery) Ardis Hughs, MD as Attending Physician (Urology)   Assessment:   This is a routine wellness examination for Troy [name]. Physical assessment deferred to PCP.  Exercise Activities and Dietary recommendations Current Exercise Habits: Home exercise routine, Type of exercise: walking;stretching;calisthenics(plays golf), Time (Minutes): 60, Frequency (Times/Week): 7, Weekly Exercise (Minutes/Week): 420, Intensity: Mild, Exercise limited by: orthopedic condition(s)  Diet (meal preparation, eat out, water intake, caffeinated beverages, dairy products, fruits and vegetables): in general, a "healthy" diet  , well balanced   Reviewed heart healthy diet. Encouraged patient to increase daily water and healthy fluid intake.    Goals    . Exercise 150 minutes per week (moderate activity)     Wants to increase exercise;  Play golf 3 times; take one afternoon; walk at least 1 hour;     . I would like to do more cardio      I will take my young dog to [facility] and walk  Briskly 3 times a  week or play golf 5 times a week.    . Patient Stated     I want increase my cardiovascular exercise by playing golf and walking my dogs briskly. Enjoy life and family.       Fall Risk Fall Risk  12/21/2018 10/13/2017 09/30/2017 10/13/2016 09/29/2016  Falls in the past year? 1 Yes Yes Yes No  Number falls in past yr: 0 1 1 - -  Comment - - - - -  Injury with Fall? 0 No No - -  Risk for fall due to : Impaired mobility;Impaired balance/gait - - - -  Follow up Falls prevention discussed Education provided;Falls prevention discussed - - -    Depression Screen PHQ 2/9 Scores 12/21/2018 10/13/2017 09/30/2017 10/13/2016  PHQ - 2 Score 0 0 0 0    Cognitive Function       Ad8 score reviewed for issues:  Issues making decisions: no  Less interest in hobbies / activities: no  Repeats questions, stories (family complaining): no  Trouble using ordinary gadgets (microwave, computer, phone):no  Forgets the month or year: no  Mismanaging finances: no  Remembering appts: no  Daily problems with thinking and/or memory: no Ad8 score is= 0  Immunization History  Administered Date(s) Administered  . H1N1 04/10/2008  . Influenza Whole 05/04/2007, 03/08/2009, 02/17/2010, 01/07/2011  . Influenza, High Dose Seasonal PF 01/29/2014, 02/22/2018  . Pneumococcal Conjugate-13 08/30/2013  . Pneumococcal Polysaccharide-23 11/25/2006, 08/29/2015  . Td 04/10/2008  . Zoster 04/10/2008   Screening Tests Health Maintenance  Topic Date Due  . COLONOSCOPY  01/24/2018  . TETANUS/TDAP  04/10/2018  . INFLUENZA VACCINE  12/03/2018  . Hepatitis C Screening  Completed  . PNA vac Low Risk Adult  Completed       Plan:    Reviewed health maintenance screenings with patient today and relevant education, vaccines, and/or referrals were provided.   Continue to eat heart healthy diet (full of fruits, vegetables, whole grains, lean protein, water--limit salt, fat, and sugar intake) and increase physical  activity as tolerated.  Continue doing brain stimulating activities (puzzles, reading, adult coloring books, staying active) to keep memory sharp.   I have personally reviewed and noted the following in the  patient's chart:   . Medical and social history . Use of alcohol, tobacco or illicit drugs  . Current medications and supplements . Functional ability and status . Nutritional status . Physical activity . Advanced directives . List of other physicians . Screenings to include cognitive, depression, and falls . Referrals and appointments  In addition, I have reviewed and discussed with patient certain preventive protocols, quality metrics, and best practice recommendations. A written personalized care plan for preventive services as well as general preventive health recommendations were provided to patient.     Michiel Cowboy, RN  12/21/2018  Medical screening examination/treatment/procedure(s) were performed by non-physician practitioner and as supervising physician I was immediately available for consultation/collaboration. I agree with above. Cathlean Cower, MD

## 2018-12-21 ENCOUNTER — Ambulatory Visit (INDEPENDENT_AMBULATORY_CARE_PROVIDER_SITE_OTHER): Payer: Medicare Other | Admitting: *Deleted

## 2018-12-21 DIAGNOSIS — Z0000 Encounter for general adult medical examination without abnormal findings: Secondary | ICD-10-CM | POA: Diagnosis not present

## 2019-01-09 ENCOUNTER — Other Ambulatory Visit: Payer: Self-pay | Admitting: Internal Medicine

## 2019-01-16 DIAGNOSIS — Z23 Encounter for immunization: Secondary | ICD-10-CM | POA: Diagnosis not present

## 2019-03-12 ENCOUNTER — Other Ambulatory Visit: Payer: Self-pay | Admitting: Internal Medicine

## 2019-04-03 ENCOUNTER — Other Ambulatory Visit: Payer: Self-pay | Admitting: Internal Medicine

## 2019-04-03 NOTE — Telephone Encounter (Signed)
Done erx 

## 2019-05-10 ENCOUNTER — Other Ambulatory Visit: Payer: Self-pay | Admitting: Internal Medicine

## 2019-06-01 ENCOUNTER — Ambulatory Visit: Payer: Medicare Other

## 2019-06-01 ENCOUNTER — Other Ambulatory Visit: Payer: Self-pay | Admitting: Internal Medicine

## 2019-06-01 NOTE — Telephone Encounter (Signed)
Please refill as per office routine med refill policy (all routine meds refilled for 3 mo or monthly per pt preference up to one year from last visit, then month to month grace period for 3 mo, then further med refills will have to be denied)  

## 2019-06-09 ENCOUNTER — Ambulatory Visit: Payer: Medicare Other | Attending: Internal Medicine

## 2019-06-09 DIAGNOSIS — Z23 Encounter for immunization: Secondary | ICD-10-CM | POA: Insufficient documentation

## 2019-06-09 NOTE — Progress Notes (Signed)
   Covid-19 Vaccination Clinic  Name:  Troy [name]    MRN: [id number] DOB: 09-19-1945  06/09/2019  [name] was observed post Covid-19 immunization for 15 minutes without incidence. He was provided with Vaccine Information Sheet and instruction to access the V-Safe system.   [name] was instructed to call 911 with any severe reactions post vaccine: Marland Kitchen Difficulty breathing  . Swelling of your face and throat  . A fast heartbeat  . A bad rash all over your body  . Dizziness and weakness    Immunizations Administered    Name Date Dose VIS Date Route   Pfizer COVID-19 Vaccine 06/09/2019  1:48 PM 0.3 mL 04/14/2019 Intramuscular   Manufacturer: [place]   Lot: [id number]   [place]: [id number]

## 2019-06-11 ENCOUNTER — Other Ambulatory Visit: Payer: Self-pay | Admitting: Internal Medicine

## 2019-06-11 NOTE — Telephone Encounter (Signed)
Please refill as per office routine med refill policy (all routine meds refilled for 3 mo or monthly per pt preference up to one year from last visit, then month to month grace period for 3 mo, then further med refills will have to be denied)  

## 2019-06-12 ENCOUNTER — Other Ambulatory Visit: Payer: Self-pay

## 2019-06-12 ENCOUNTER — Ambulatory Visit: Payer: Medicare Other

## 2019-06-12 MED ORDER — LOSARTAN POTASSIUM 100 MG PO TABS: 100.0000 mg | ORAL_TABLET | Freq: Every day | ORAL | 0 refills | Status: DC

## 2019-06-12 MED ORDER — ATENOLOL 25 MG PO TABS: 50.0000 mg | ORAL_TABLET | Freq: Every day | ORAL | 0 refills | Status: DC

## 2019-07-04 ENCOUNTER — Ambulatory Visit: Payer: Medicare Other | Attending: Internal Medicine

## 2019-07-04 DIAGNOSIS — Z23 Encounter for immunization: Secondary | ICD-10-CM

## 2019-07-04 NOTE — Progress Notes (Signed)
   Covid-19 Vaccination Clinic  Name:  Troy [name]    MRN: [id number] DOB: [birth year]  07/04/2019  Mr. Mundie was observed post Covid-19 immunization for 15 minutes without incident. He was provided with Vaccine Information Sheet and instruction to access the V-Safe system.   Mr. Culver was instructed to call 911 with any severe reactions post vaccine: Marland Kitchen Difficulty breathing  . Swelling of face and throat  . A fast heartbeat  . A bad rash all over body  . Dizziness and weakness   Immunizations Administered    Name Date Dose VIS Date Route   Pfizer COVID-19 Vaccine 07/04/2019  9:42 AM 0.3 mL 04/14/2019 Intramuscular   Manufacturer: [place]   Lot: [id number]   [place]: [id number]

## 2019-07-11 ENCOUNTER — Other Ambulatory Visit: Payer: Self-pay | Admitting: Internal Medicine

## 2019-07-14 MED ORDER — ATENOLOL 25 MG PO TABS: 50.0000 mg | ORAL_TABLET | Freq: Every day | ORAL | 0 refills | Status: DC

## 2019-07-14 NOTE — Addendum Note (Signed)
Addended by: Juliet Rude on: 07/14/2019 01:14 PM   Modules accepted: Orders

## 2019-07-14 NOTE — Telephone Encounter (Signed)
Patient calling to schedule appointment to get refill. Patient is scheduled for Monday 07/17/2019. Would like to know if meds could be send in to pharmacy since he has made an appointment? Please advise.   [id number] - [place] ([address]), [place] - [place]

## 2019-07-17 ENCOUNTER — Ambulatory Visit (INDEPENDENT_AMBULATORY_CARE_PROVIDER_SITE_OTHER): Payer: Medicare Other | Admitting: Internal Medicine

## 2019-07-17 ENCOUNTER — Other Ambulatory Visit: Payer: Self-pay

## 2019-07-17 ENCOUNTER — Encounter: Payer: Self-pay | Admitting: Internal Medicine

## 2019-07-17 VITALS — BP 142/84 | HR 64 | Temp 98.0°F | Ht 75.0 in | Wt 221.0 lb

## 2019-07-17 DIAGNOSIS — I10 Essential (primary) hypertension: Secondary | ICD-10-CM | POA: Diagnosis not present

## 2019-07-17 DIAGNOSIS — E559 Vitamin D deficiency, unspecified: Secondary | ICD-10-CM | POA: Diagnosis not present

## 2019-07-17 DIAGNOSIS — R739 Hyperglycemia, unspecified: Secondary | ICD-10-CM

## 2019-07-17 DIAGNOSIS — E785 Hyperlipidemia, unspecified: Secondary | ICD-10-CM

## 2019-07-17 DIAGNOSIS — E611 Iron deficiency: Secondary | ICD-10-CM | POA: Diagnosis not present

## 2019-07-17 DIAGNOSIS — E538 Deficiency of other specified B group vitamins: Secondary | ICD-10-CM | POA: Diagnosis not present

## 2019-07-17 LAB — CBC WITH DIFFERENTIAL/PLATELET
Basophils Absolute: 0 10*3/uL (ref 0.0–0.1)
Basophils Relative: 0.8 % (ref 0.0–3.0)
Eosinophils Absolute: 0.3 10*3/uL (ref 0.0–0.7)
Eosinophils Relative: 5.9 % — ABNORMAL HIGH (ref 0.0–5.0)
HCT: 44.4 % (ref 39.0–52.0)
Hemoglobin: 15.1 g/dL (ref 13.0–17.0)
Lymphocytes Relative: 28.8 % (ref 12.0–46.0)
Lymphs Abs: 1.5 10*3/uL (ref 0.7–4.0)
MCHC: 34 g/dL (ref 30.0–36.0)
MCV: 91.8 fl (ref 78.0–100.0)
Monocytes Absolute: 0.5 10*3/uL (ref 0.1–1.0)
Monocytes Relative: 9 % (ref 3.0–12.0)
Neutro Abs: 2.9 10*3/uL (ref 1.4–7.7)
Neutrophils Relative %: 55.5 % (ref 43.0–77.0)
Platelets: 197 10*3/uL (ref 150.0–400.0)
RBC: 4.84 Mil/uL (ref 4.22–5.81)
RDW: 13.4 % (ref 11.5–15.5)
WBC: 5.3 10*3/uL (ref 4.0–10.5)

## 2019-07-17 LAB — TSH: TSH: 2.77 u[IU]/mL (ref 0.35–4.50)

## 2019-07-17 LAB — LIPID PANEL
Cholesterol: 182 mg/dL (ref 0–200)
HDL: 50.4 mg/dL (ref >39.00)
NonHDL: 131.84
Total CHOL/HDL Ratio: 4
Triglycerides: 269 mg/dL — ABNORMAL HIGH (ref 0.0–149.0)
VLDL: 53.8 mg/dL — ABNORMAL HIGH (ref 0.0–40.0)

## 2019-07-17 LAB — HEPATIC FUNCTION PANEL
ALT: 26 U/L (ref 0–53)
AST: 21 U/L (ref 0–37)
Albumin: 4.3 g/dL (ref 3.5–5.2)
Alkaline Phosphatase: 56 U/L (ref 39–117)
Bilirubin, Direct: 0.1 mg/dL (ref 0.0–0.3)
Total Bilirubin: 0.6 mg/dL (ref 0.2–1.2)
Total Protein: 7.2 g/dL (ref 6.0–8.3)

## 2019-07-17 LAB — IBC PANEL
Iron: 116 ug/dL (ref 42–165)
Saturation Ratios: 35.6 % (ref 20.0–50.0)
Transferrin: 233 mg/dL (ref 212.0–360.0)

## 2019-07-17 LAB — VITAMIN D 25 HYDROXY (VIT D DEFICIENCY, FRACTURES): VITD: 22.97 ng/mL — ABNORMAL LOW (ref 30.00–100.00)

## 2019-07-17 LAB — BASIC METABOLIC PANEL
BUN: 31 mg/dL — ABNORMAL HIGH (ref 6–23)
CO2: 26 mEq/L (ref 19–32)
Calcium: 9.6 mg/dL (ref 8.4–10.5)
Chloride: 105 mEq/L (ref 96–112)
Creatinine, Ser: 0.93 mg/dL (ref 0.40–1.50)
GFR: 79.18 mL/min (ref >60.00)
Glucose, Bld: 104 mg/dL — ABNORMAL HIGH (ref 70–99)
Potassium: 4.3 mEq/L (ref 3.5–5.1)
Sodium: 139 mEq/L (ref 135–145)

## 2019-07-17 LAB — VITAMIN B12: Vitamin B-12: 422 pg/mL (ref 211–911)

## 2019-07-17 LAB — HEMOGLOBIN A1C: Hgb A1c MFr Bld: 5.8 % (ref 4.6–6.5)

## 2019-07-17 LAB — LDL CHOLESTEROL, DIRECT: Direct LDL: 97 mg/dL

## 2019-07-17 MED ORDER — PANTOPRAZOLE SODIUM 40 MG PO TBEC: 40.0000 mg | DELAYED_RELEASE_TABLET | Freq: Every day | ORAL | 3 refills | Status: DC

## 2019-07-17 NOTE — Progress Notes (Signed)
Subjective:    Patient ID: DAGO MOM, male    DOB: 13-Jan-1946, 74 y.o.   MRN: [id number]  HPI Here for yearly f/u;  Overall doing ok;  Pt denies Chest pain, worsening SOB, DOE, wheezing, orthopnea, PND, worsening LE edema, palpitations, dizziness or syncope.  Pt denies neurological change such as new headache, facial or extremity weakness.  Pt denies polydipsia, polyuria, or low sugar symptoms. Pt states overall good compliance with treatment and medications, good tolerability, and has been trying to follow appropriate diet.  Pt denies worsening depressive symptoms, suicidal ideation or panic. No fever, night sweats, wt loss, loss of appetite, or other constitutional symptoms.  Pt states good ability with ADL's, has low fall risk, home safety reviewed and adequate, no other significant changes in hearing or vision, and only occasionally active with exercise. Lost wt with better diet and intermittent fasting.  Wt Readings from Last 3 Encounters:  07/17/19 221 lb (100.2 kg)  06/28/18 225 lb (102.1 kg)  04/19/18 225 lb 2 oz (102.1 kg)  Does c/o ongoing fatigue, but denies signficant daytime hypersomnolence.  Also has had mild worsening reflux, but no abd pain, dysphagia, n/v, bowel change or blood. Past Medical History:  Diagnosis Date  . ALLERGIC RHINITIS 05/04/2007  . BACK PAIN, LUMBAR 02/17/2010  . CHEST PAIN 07/15/2009  . CONSTIPATION 02/17/2010  . Heart murmur    remote hx of murmur  . HYPERLIPIDEMIA 05/04/2007  . HYPERTENSION 05/04/2007  . Interstitial cystitis 08/30/2013  . Lumbar disc disease 10/01/2011  . NECK PAIN, ACUTE 07/15/2009  . Prostate enlargement   . PSVT 05/04/2007  . Skin cancer 08/30/2013   Basal cell x 2, dec 2014  . Spermatocele   . VERTIGO 05/04/2007   Past Surgical History:  Procedure Laterality Date  . APPENDECTOMY    . KNEE ARTHROSCOPY  30 yrs ago  . s/p dental implant left upper jaw  11/09   also rt lower jaw  . s/p RK surgery bilat    .  SPERMATOCELECTOMY  12/04/2011   Procedure: SPERMATOCELECTOMY;  Surgeon: Hanley Ben, MD;  Location: [facility];  Service: Urology;  Laterality: Left;  1 hour requested for this case   . TONSILLECTOMY      reports that he has never smoked. He has never used smokeless tobacco. He reports that he does not drink alcohol or use drugs. family history includes Cancer in his mother; Dementia in his mother; Diabetes in an other family member; Heart disease in an other family member; Hyperlipidemia in an other family member; Hypertension in his mother. Allergies  Allergen Reactions  . Celebrex [Celecoxib] Anaphylaxis    Oral sores  . Cyclobenzaprine Hcl     REACTION: faical skin rash, 'hard cysts'  . Mobic [Meloxicam] Other (See Comments)    Sores in mouth   Current Outpatient Medications on File Prior to Visit  Medication Sig Dispense Refill  . aspirin 81 MG EC tablet Take 81 mg by mouth daily.      Marland Kitchen atenolol (TENORMIN) 25 MG tablet Take 2 tablets (50 mg total) by mouth daily. Overdue for Annual appt must see provider for future refills 60 tablet 0  . atorvastatin (LIPITOR) 40 MG tablet Take 1 tablet (40 mg total) by mouth daily at 6 PM. Annual appt due in April must see provider for future refills 90 tablet 0  . famotidine-calcium carbonate-magnesium hydroxide (PEPCID COMPLETE) 10-800-165 MG chewable tablet Chew 1 tablet by mouth daily as needed.    Marland Kitchen  losartan (COZAAR) 100 MG tablet Take 1 tablet (100 mg total) by mouth daily. 90 tablet 0  . traMADol (ULTRAM) 50 MG tablet TAKE 1 TABLET BY MOUTH EVERY 6 HOURS AS NEEDED 60 tablet 2  . bismuth subsalicylate (PEPTO BISMOL) 262 MG/15ML suspension Take 30 mLs by mouth every 4 (four) hours as needed for diarrhea or loose stools. (Patient not taking: Reported on 07/17/2019) 360 mL 0  . fluticasone (FLONASE) 50 MCG/ACT nasal spray Place 2 sprays into both nostrils daily. 58 g 3  . montelukast (SINGULAIR) 10 MG tablet Take 1 tablet (10 mg  total) by mouth daily. (Patient not taking: Reported on 07/17/2019) 90 tablet 3  . Probiotic Product (PROBIOTIC PO) Take by mouth daily.      . pseudoephedrine (SUDAFED) 30 MG tablet Take 30 mg by mouth every 4 (four) hours as needed for congestion.     No current facility-administered medications on file prior to visit.   Review of Systems All otherwise neg per pt     Objective:   Physical Exam BP (!) 142/84   Pulse 64   Temp 98 F (36.7 C)   Ht 6\' 3"  (1.905 m)   Wt 221 lb (100.2 kg)   SpO2 99%   BMI 27.62 kg/m  VS noted,  Constitutional: Pt appears in NAD HENT: Head: NCAT.  Right Ear: External ear normal.  Left Ear: External ear normal.  Eyes: . Pupils are equal, round, and reactive to light. Conjunctivae and EOM are normal Nose: without d/c or deformity Neck: Neck supple. Gross normal ROM Cardiovascular: Normal rate and regular rhythm.   Pulmonary/Chest: Effort normal and breath sounds without rales or wheezing.  Abd:  Soft, NT, ND, + BS, no organomegaly Neurological: Pt is alert. At baseline orientation, motor grossly intact Skin: Skin is warm. No rashes, other new lesions, no LE edema Psychiatric: Pt behavior is normal without agitation  All otherwise neg per pt Lab Results  Component Value Date   WBC 6.6 06/28/2018   HGB 15.3 06/28/2018   HCT 45.4 06/28/2018   PLT 217.0 06/28/2018   GLUCOSE 86 06/28/2018   CHOL 209 (H) 09/30/2017   TRIG 471.0 (H) 09/30/2017   HDL 55.80 09/30/2017   LDLDIRECT 109.0 09/30/2017   LDLCALC 100 (H) 09/30/2016   ALT 28 06/28/2018   AST 20 06/28/2018   NA 140 06/28/2018   K 4.2 06/28/2018   CL 104 06/28/2018   CREATININE 1.04 06/28/2018   BUN 19 06/28/2018   CO2 28 06/28/2018   TSH 2.02 09/30/2017   PSA 3.24 09/30/2016   HGBA1C 6.0 09/30/2017          Assessment & Plan:

## 2019-07-17 NOTE — Assessment & Plan Note (Signed)
stable overall by history and exam, recent data reviewed with pt, and pt to continue medical treatment as before,  to f/u any worsening symptoms or concerns  

## 2019-07-17 NOTE — Assessment & Plan Note (Addendum)
stable overall by history and exam, recent data reviewed with pt, and pt to continue medical treatment as before,  to f/u any worsening symptoms or concerns  I spent 30 minutes in preparing to see the patient by review of recent labs, imaging and procedures, obtaining and reviewing separately obtained history, communicating with the patient and family or caregiver, ordering medications, tests or procedures, and documenting clinical information in the EHR including the differential Dx, treatment, and any further evaluation and other management of HLD, hyperglycemia, HTN

## 2019-07-17 NOTE — Patient Instructions (Addendum)
Please return in 1 week (or after) for the Tdap tetanus shot at a Nurse Visit appt  Please take all new medication as prescribed - the protonix (ok to hold on the pepcid while taking this)  Please continue all other medications as before, and refills have been done if requested.  Please have the pharmacy call with any other refills you may need.  Please continue your efforts at being more active, low cholesterol diet, and weight control.  You are otherwise up to date with prevention measures today.  Please keep your appointments with your specialists as you may have planned  Please go to the LAB at the blood drawing area for the tests to be done  You will be contacted by phone if any changes need to be made immediately.  Otherwise, you will receive a letter about your results with an explanation, but please check with MyChart first.  Please remember to sign up for MyChart if you have not done so, as this will be important to you in the future with finding out test results, communicating by private email, and scheduling acute appointments online when needed.  Please make an Appointment to return for your 1 year visit, or sooner if needed

## 2019-07-18 LAB — URINALYSIS, ROUTINE W REFLEX MICROSCOPIC
Bilirubin Urine: NEGATIVE
Hgb urine dipstick: NEGATIVE
Ketones, ur: NEGATIVE
Leukocytes,Ua: NEGATIVE
Nitrite: NEGATIVE
RBC / HPF: NONE SEEN (ref 0–?)
Specific Gravity, Urine: 1.025 (ref 1.000–1.030)
Total Protein, Urine: NEGATIVE
Urine Glucose: NEGATIVE
Urobilinogen, UA: 1 (ref 0.0–1.0)
WBC, UA: NONE SEEN (ref 0–?)
pH: 5.5 (ref 5.0–8.0)

## 2019-07-19 ENCOUNTER — Other Ambulatory Visit: Payer: Self-pay | Admitting: Internal Medicine

## 2019-07-19 MED ORDER — VITAMIN D (ERGOCALCIFEROL) 1.25 MG (50000 UNIT) PO CAPS: 50000.0000 [IU] | ORAL_CAPSULE | ORAL | 0 refills | Status: DC

## 2019-08-09 ENCOUNTER — Other Ambulatory Visit: Payer: Self-pay | Admitting: Internal Medicine

## 2019-08-31 ENCOUNTER — Other Ambulatory Visit: Payer: Self-pay | Admitting: Internal Medicine

## 2019-09-17 ENCOUNTER — Other Ambulatory Visit: Payer: Self-pay | Admitting: Internal Medicine

## 2019-09-17 NOTE — Telephone Encounter (Signed)
Please refill as per office routine med refill policy (all routine meds refilled for 3 mo or monthly per pt preference up to one year from last visit, then month to month grace period for 3 mo, then further med refills will have to be denied)  

## 2019-10-05 ENCOUNTER — Other Ambulatory Visit: Payer: Self-pay | Admitting: Internal Medicine

## 2019-10-05 NOTE — Telephone Encounter (Signed)
Check [place] registry last filled 08/07/2019. MD is out of the office this week. Pls advise on refill.Marland Kitchenlmb

## 2019-12-15 ENCOUNTER — Encounter: Payer: Self-pay | Admitting: Internal Medicine

## 2019-12-15 DIAGNOSIS — L814 Other melanin hyperpigmentation: Secondary | ICD-10-CM | POA: Diagnosis not present

## 2019-12-15 DIAGNOSIS — D485 Neoplasm of uncertain behavior of skin: Secondary | ICD-10-CM | POA: Diagnosis not present

## 2019-12-15 DIAGNOSIS — D225 Melanocytic nevi of trunk: Secondary | ICD-10-CM | POA: Diagnosis not present

## 2019-12-15 DIAGNOSIS — L739 Follicular disorder, unspecified: Secondary | ICD-10-CM | POA: Diagnosis not present

## 2019-12-15 DIAGNOSIS — Z85828 Personal history of other malignant neoplasm of skin: Secondary | ICD-10-CM | POA: Diagnosis not present

## 2019-12-15 DIAGNOSIS — D2262 Melanocytic nevi of left upper limb, including shoulder: Secondary | ICD-10-CM | POA: Diagnosis not present

## 2019-12-15 DIAGNOSIS — L570 Actinic keratosis: Secondary | ICD-10-CM | POA: Diagnosis not present

## 2019-12-15 DIAGNOSIS — D2272 Melanocytic nevi of left lower limb, including hip: Secondary | ICD-10-CM | POA: Diagnosis not present

## 2019-12-15 DIAGNOSIS — D2371 Other benign neoplasm of skin of right lower limb, including hip: Secondary | ICD-10-CM | POA: Diagnosis not present

## 2019-12-15 DIAGNOSIS — L821 Other seborrheic keratosis: Secondary | ICD-10-CM | POA: Diagnosis not present

## 2019-12-15 DIAGNOSIS — D2271 Melanocytic nevi of right lower limb, including hip: Secondary | ICD-10-CM | POA: Diagnosis not present

## 2019-12-15 DIAGNOSIS — D2261 Melanocytic nevi of right upper limb, including shoulder: Secondary | ICD-10-CM | POA: Diagnosis not present

## 2019-12-15 DIAGNOSIS — D1801 Hemangioma of skin and subcutaneous tissue: Secondary | ICD-10-CM | POA: Diagnosis not present

## 2019-12-18 ENCOUNTER — Other Ambulatory Visit: Payer: Self-pay | Admitting: Internal Medicine

## 2019-12-27 ENCOUNTER — Encounter: Payer: Self-pay | Admitting: Internal Medicine

## 2019-12-28 DIAGNOSIS — R350 Frequency of micturition: Secondary | ICD-10-CM | POA: Diagnosis not present

## 2019-12-28 DIAGNOSIS — R351 Nocturia: Secondary | ICD-10-CM | POA: Diagnosis not present

## 2019-12-28 DIAGNOSIS — N401 Enlarged prostate with lower urinary tract symptoms: Secondary | ICD-10-CM | POA: Diagnosis not present

## 2020-01-02 ENCOUNTER — Other Ambulatory Visit: Payer: Self-pay | Admitting: Internal Medicine

## 2020-01-02 NOTE — Telephone Encounter (Signed)
Done erx 

## 2020-01-31 DIAGNOSIS — Z23 Encounter for immunization: Secondary | ICD-10-CM | POA: Diagnosis not present

## 2020-02-11 ENCOUNTER — Other Ambulatory Visit: Payer: Self-pay | Admitting: Internal Medicine

## 2020-02-11 NOTE — Telephone Encounter (Signed)
Please refill as per office routine med refill policy (all routine meds refilled for 3 mo or monthly per pt preference up to one year from last visit, then month to month grace period for 3 mo, then further med refills will have to be denied)  

## 2020-03-11 ENCOUNTER — Other Ambulatory Visit: Payer: Self-pay | Admitting: Internal Medicine

## 2020-03-13 ENCOUNTER — Other Ambulatory Visit: Payer: Self-pay | Admitting: Internal Medicine

## 2020-03-13 NOTE — Telephone Encounter (Signed)
Please refill as per office routine med refill policy (all routine meds refilled for 3 mo or monthly per pt preference up to one year from last visit, then month to month grace period for 3 mo, then further med refills will have to be denied)  

## 2020-04-09 ENCOUNTER — Other Ambulatory Visit: Payer: Self-pay | Admitting: Internal Medicine

## 2020-04-09 NOTE — Telephone Encounter (Signed)
Please refill as per office routine med refill policy (all routine meds refilled for 3 mo or monthly per pt preference up to one year from last visit, then month to month grace period for 3 mo, then further med refills will have to be denied)  

## 2020-05-09 ENCOUNTER — Other Ambulatory Visit: Payer: Self-pay | Admitting: Internal Medicine

## 2020-05-09 NOTE — Telephone Encounter (Signed)
Please refill as per office routine med refill policy (all routine meds refilled for 3 mo or monthly per pt preference up to one year from last visit, then month to month grace period for 3 mo, then further med refills will have to be denied)  

## 2020-06-09 ENCOUNTER — Other Ambulatory Visit: Payer: Self-pay | Admitting: Internal Medicine

## 2020-06-09 NOTE — Telephone Encounter (Signed)
Please refill as per office routine med refill policy (all routine meds refilled for 3 mo or monthly per pt preference up to one year from last visit, then month to month grace period for 3 mo, then further med refills will have to be denied)  

## 2020-07-10 ENCOUNTER — Other Ambulatory Visit: Payer: Self-pay | Admitting: Internal Medicine

## 2020-07-17 ENCOUNTER — Other Ambulatory Visit: Payer: Self-pay | Admitting: Internal Medicine

## 2020-07-17 NOTE — Telephone Encounter (Signed)
Tramadol refilled   Ok to let pt know - pt is due for ROV please for further refills

## 2020-07-18 ENCOUNTER — Other Ambulatory Visit: Payer: Self-pay | Admitting: Internal Medicine

## 2020-07-22 ENCOUNTER — Other Ambulatory Visit: Payer: Self-pay

## 2020-07-22 NOTE — Telephone Encounter (Signed)
Pt spouse notified to have pt called back to schedule and office visit for further refills.

## 2020-07-26 ENCOUNTER — Ambulatory Visit: Payer: Medicare Other | Admitting: Internal Medicine

## 2020-08-01 ENCOUNTER — Ambulatory Visit (INDEPENDENT_AMBULATORY_CARE_PROVIDER_SITE_OTHER): Payer: Medicare Other | Admitting: Internal Medicine

## 2020-08-01 ENCOUNTER — Other Ambulatory Visit: Payer: Self-pay

## 2020-08-01 ENCOUNTER — Encounter: Payer: Self-pay | Admitting: Internal Medicine

## 2020-08-01 VITALS — BP 136/80 | HR 64 | Ht 75.0 in | Wt 216.0 lb

## 2020-08-01 DIAGNOSIS — E538 Deficiency of other specified B group vitamins: Secondary | ICD-10-CM

## 2020-08-01 DIAGNOSIS — E7800 Pure hypercholesterolemia, unspecified: Secondary | ICD-10-CM | POA: Diagnosis not present

## 2020-08-01 DIAGNOSIS — N320 Bladder-neck obstruction: Secondary | ICD-10-CM | POA: Diagnosis not present

## 2020-08-01 DIAGNOSIS — I10 Essential (primary) hypertension: Secondary | ICD-10-CM | POA: Diagnosis not present

## 2020-08-01 DIAGNOSIS — E559 Vitamin D deficiency, unspecified: Secondary | ICD-10-CM

## 2020-08-01 DIAGNOSIS — R739 Hyperglycemia, unspecified: Secondary | ICD-10-CM | POA: Diagnosis not present

## 2020-08-01 LAB — CBC WITH DIFFERENTIAL/PLATELET
Basophils Absolute: 0 10*3/uL (ref 0.0–0.1)
Basophils Relative: 0.4 % (ref 0.0–3.0)
Eosinophils Absolute: 0.4 10*3/uL (ref 0.0–0.7)
Eosinophils Relative: 6.4 % — ABNORMAL HIGH (ref 0.0–5.0)
HCT: 46.5 % (ref 39.0–52.0)
Hemoglobin: 15.9 g/dL (ref 13.0–17.0)
Lymphocytes Relative: 25 % (ref 12.0–46.0)
Lymphs Abs: 1.7 10*3/uL (ref 0.7–4.0)
MCHC: 34.3 g/dL (ref 30.0–36.0)
MCV: 89.8 fl (ref 78.0–100.0)
Monocytes Absolute: 0.6 10*3/uL (ref 0.1–1.0)
Monocytes Relative: 9.5 % (ref 3.0–12.0)
Neutro Abs: 3.9 10*3/uL (ref 1.4–7.7)
Neutrophils Relative %: 58.7 % (ref 43.0–77.0)
Platelets: 208 10*3/uL (ref 150.0–400.0)
RBC: 5.18 Mil/uL (ref 4.22–5.81)
RDW: 13.3 % (ref 11.5–15.5)
WBC: 6.7 10*3/uL (ref 4.0–10.5)

## 2020-08-01 LAB — HEPATIC FUNCTION PANEL
ALT: 30 U/L (ref 0–53)
AST: 23 U/L (ref 0–37)
Albumin: 4.5 g/dL (ref 3.5–5.2)
Alkaline Phosphatase: 60 U/L (ref 39–117)
Bilirubin, Direct: 0.1 mg/dL (ref 0.0–0.3)
Total Bilirubin: 0.9 mg/dL (ref 0.2–1.2)
Total Protein: 7.7 g/dL (ref 6.0–8.3)

## 2020-08-01 LAB — BASIC METABOLIC PANEL
BUN: 29 mg/dL — ABNORMAL HIGH (ref 6–23)
CO2: 28 mEq/L (ref 19–32)
Calcium: 10.1 mg/dL (ref 8.4–10.5)
Chloride: 103 mEq/L (ref 96–112)
Creatinine, Ser: 0.98 mg/dL (ref 0.40–1.50)
GFR: 75.07 mL/min (ref >60.00)
Glucose, Bld: 115 mg/dL — ABNORMAL HIGH (ref 70–99)
Potassium: 4.5 mEq/L (ref 3.5–5.1)
Sodium: 138 mEq/L (ref 135–145)

## 2020-08-01 LAB — LIPID PANEL
Cholesterol: 188 mg/dL (ref 0–200)
HDL: 52.6 mg/dL (ref >39.00)
LDL Cholesterol: 103 mg/dL — ABNORMAL HIGH (ref 0–99)
NonHDL: 135.78
Total CHOL/HDL Ratio: 4
Triglycerides: 162 mg/dL — ABNORMAL HIGH (ref 0.0–149.0)
VLDL: 32.4 mg/dL (ref 0.0–40.0)

## 2020-08-01 LAB — TSH: TSH: 3.24 u[IU]/mL (ref 0.35–4.50)

## 2020-08-01 LAB — URINALYSIS, ROUTINE W REFLEX MICROSCOPIC
Bilirubin Urine: NEGATIVE
Hgb urine dipstick: NEGATIVE
Ketones, ur: NEGATIVE
Leukocytes,Ua: NEGATIVE
Nitrite: NEGATIVE
RBC / HPF: NONE SEEN (ref 0–?)
Specific Gravity, Urine: 1.01 (ref 1.000–1.030)
Total Protein, Urine: NEGATIVE
Urine Glucose: NEGATIVE
Urobilinogen, UA: 0.2 (ref 0.0–1.0)
WBC, UA: NONE SEEN (ref 0–?)
pH: 7 (ref 5.0–8.0)

## 2020-08-01 LAB — VITAMIN B12: Vitamin B-12: 904 pg/mL (ref 211–911)

## 2020-08-01 LAB — HEMOGLOBIN A1C: Hgb A1c MFr Bld: 5.9 % (ref 4.6–6.5)

## 2020-08-01 LAB — VITAMIN D 25 HYDROXY (VIT D DEFICIENCY, FRACTURES): VITD: 17.51 ng/mL — ABNORMAL LOW (ref 30.00–100.00)

## 2020-08-01 LAB — PSA: PSA: 3.21 ng/mL (ref 0.10–4.00)

## 2020-08-01 MED ORDER — ATENOLOL 25 MG PO TABS: 50.0000 mg | ORAL_TABLET | Freq: Every day | ORAL | 3 refills | Status: DC

## 2020-08-01 MED ORDER — LOSARTAN POTASSIUM 100 MG PO TABS: 100.0000 mg | ORAL_TABLET | Freq: Every day | ORAL | 3 refills | Status: DC

## 2020-08-01 NOTE — Progress Notes (Signed)
Patient ID: Troy [name], male   DOB: 04/06/1946, 75 y.o.   MRN: [id number]         Chief Complaint:: yearly exam       HPI:  Troy [name] is a 75 y.o. male here overall doing ok, and up to date with preventive referrals and immunizations.  No other complaints. Pt denies chest pain, increased sob or doe, wheezing, orthopnea, PND, increased LE swelling, palpitations, dizziness or syncope.  Denies new focal neuro s/s.   Pt denies polydipsia, polyuria, Pt denies fever, wt loss, night sweats, loss of appetite, or other constitutional symptoms          Wife with back surgury, heart ablation, with rectal ca stage 3 at [facility].    Wt Readings from Last 3 Encounters:  08/01/20 216 lb (98 kg)  07/17/19 221 lb (100.2 kg)  06/28/18 225 lb (102.1 kg)   BP Readings from Last 3 Encounters:  08/01/20 136/80  07/17/19 (!) 142/84  06/28/18 126/76   Immunization History  Administered Date(s) Administered  . H1N1 04/10/2008  . Influenza Whole 05/04/2007, 03/08/2009, 02/17/2010, 01/07/2011  . Influenza, High Dose Seasonal PF 01/29/2014, 02/22/2018  . PFIZER(Purple Top)SARS-COV-2 Vaccination 06/09/2019, 07/04/2019, 02/05/2020  . Pneumococcal Conjugate-13 08/30/2013  . Pneumococcal Polysaccharide-23 11/25/2006, 08/29/2015  . Td 04/10/2008  . Zoster 04/10/2008   Health Maintenance Due  Topic Date Due  . COVID-19 Vaccine (4 - Booster for [employer] series) 08/05/2020      Past Medical History:  Diagnosis Date  . ALLERGIC RHINITIS 05/04/2007  . BACK PAIN, LUMBAR 02/17/2010  . CHEST PAIN 07/15/2009  . CONSTIPATION 02/17/2010  . Heart murmur    remote hx of murmur  . HYPERLIPIDEMIA 05/04/2007  . HYPERTENSION 05/04/2007  . Interstitial cystitis 08/30/2013  . Lumbar disc disease 10/01/2011  . NECK PAIN, ACUTE 07/15/2009  . Prostate enlargement   . PSVT 05/04/2007  . Skin cancer 08/30/2013   Basal cell x 2, dec 2014  . Spermatocele   . VERTIGO 05/04/2007   Past Surgical History:  Procedure  Laterality Date  . APPENDECTOMY    . KNEE ARTHROSCOPY  30 yrs ago  . s/p dental implant left upper jaw  11/09   also rt lower jaw  . s/p RK surgery bilat    . SPERMATOCELECTOMY  12/04/2011   Procedure: SPERMATOCELECTOMY;  Surgeon: Hanley Ben, MD;  Location: [facility];  Service: Urology;  Laterality: Left;  1 hour requested for this case   . TONSILLECTOMY      reports that he has never smoked. He has never used smokeless tobacco. He reports that he does not drink alcohol and does not use drugs. family history includes Cancer in his mother; Dementia in his mother; Diabetes in an other family member; Heart disease in an other family member; Hyperlipidemia in an other family member; Hypertension in his mother. Allergies  Allergen Reactions  . Celebrex [Celecoxib] Anaphylaxis    Oral sores  . Cyclobenzaprine Hcl     REACTION: faical skin rash, 'hard cysts'  . Mobic [Meloxicam] Other (See Comments)    Sores in mouth   Current Outpatient Medications on File Prior to Visit  Medication Sig Dispense Refill  . aspirin 81 MG EC tablet Take 81 mg by mouth daily.    Marland Kitchen atorvastatin (LIPITOR) 40 MG tablet Take 1 tablet (40 mg total) by mouth daily. 90 tablet 3  . bismuth subsalicylate (PEPTO BISMOL) 262 MG/15ML suspension Take 30 mLs by mouth every 4 (four)  hours as needed for diarrhea or loose stools. 360 mL 0  . famotidine-calcium carbonate-magnesium hydroxide (PEPCID COMPLETE) 10-800-165 MG chewable tablet Chew 1 tablet by mouth daily as needed.    . montelukast (SINGULAIR) 10 MG tablet Take 1 tablet (10 mg total) by mouth daily. 90 tablet 3  . pantoprazole (PROTONIX) 40 MG tablet Take 1 tablet (40 mg total) by mouth daily. 90 tablet 3  . Probiotic Product (PROBIOTIC PO) Take by mouth daily.    . pseudoephedrine (SUDAFED) 30 MG tablet Take 30 mg by mouth every 4 (four) hours as needed for congestion.    . traMADol (ULTRAM) 50 MG tablet TAKE 1 TABLET BY MOUTH EVERY 6 HOURS AS  NEEDED 60 tablet 0  . Vitamin D, Ergocalciferol, (DRISDOL) 1.25 MG (50000 UNIT) CAPS capsule Take 1 capsule (50,000 Units total) by mouth every 7 (seven) days. 12 capsule 0  . fluticasone (FLONASE) 50 MCG/ACT nasal spray Place 2 sprays into both nostrils daily. 58 g 3   No current facility-administered medications on file prior to visit.        ROS:  All others reviewed and negative.  Objective        PE:  BP 136/80 (BP Location: Left Arm, Patient Position: Sitting, Cuff Size: Large)   Pulse 64   Ht 6\' 3"  (1.905 m)   Wt 216 lb (98 kg)   SpO2 98%   BMI 27.00 kg/m                 Constitutional: Pt appears in NAD               HENT: Head: NCAT.                Right Ear: External ear normal.                 Left Ear: External ear normal.                Eyes: . Pupils are equal, round, and reactive to light. Conjunctivae and EOM are normal               Nose: without d/c or deformity               Neck: Neck supple. Gross normal ROM               Cardiovascular: Normal rate and regular rhythm.                 Pulmonary/Chest: Effort normal and breath sounds without rales or wheezing.                Abd:  Soft, NT, ND, + BS, no organomegaly               Neurological: Pt is alert. At baseline orientation, motor grossly intact               Skin: Skin is warm. No rashes, no other new lesions, LE edema - none               Psychiatric: Pt behavior is normal without agitation   Micro: none  Cardiac tracings I have personally interpreted today:  none  Pertinent Radiological findings (summarize): none   Lab Results  Component Value Date   WBC 6.7 08/01/2020   HGB 15.9 08/01/2020   HCT 46.5 08/01/2020   PLT 208.0 08/01/2020   GLUCOSE 115 (H) 08/01/2020   CHOL 188 08/01/2020   TRIG 162.0 (H)  08/01/2020   HDL 52.60 08/01/2020   LDLDIRECT 97.0 07/17/2019   LDLCALC 103 (H) 08/01/2020   ALT 30 08/01/2020   AST 23 08/01/2020   NA 138 08/01/2020   K 4.5 08/01/2020   CL 103  08/01/2020   CREATININE 0.98 08/01/2020   BUN 29 (H) 08/01/2020   CO2 28 08/01/2020   TSH 3.24 08/01/2020   PSA 3.21 08/01/2020   HGBA1C 5.9 08/01/2020   Assessment/Plan:  Troy [name] is a 74 y.o. White or Caucasian [1] male with  has a past medical history of ALLERGIC RHINITIS (05/04/2007), BACK PAIN, LUMBAR (02/17/2010), CHEST PAIN (07/15/2009), CONSTIPATION (02/17/2010), Heart murmur, HYPERLIPIDEMIA (05/04/2007), HYPERTENSION (05/04/2007), Interstitial cystitis (08/30/2013), Lumbar disc disease (10/01/2011), NECK PAIN, ACUTE (07/15/2009), Prostate enlargement, PSVT (05/04/2007), Skin cancer (08/30/2013), Spermatocele, and VERTIGO (05/04/2007).  Hyperlipidemia Lab Results  Component Value Date   LDLCALC 103 (H) 08/01/2020   Stable, pt to continue current statin lipitor 40   Hyperglycemia Lab Results  Component Value Date   HGBA1C 5.9 08/01/2020   Stable, pt to continue current medical treatment  - diet, wt control   Essential hypertension BP Readings from Last 3 Encounters:  08/01/20 136/80  07/17/19 (!) 142/84  06/28/18 126/76   Stable, pt to continue medical treatment tenormin, losartan   Current Outpatient Medications (Cardiovascular):  .  atorvastatin (LIPITOR) 40 MG tablet, Take 1 tablet (40 mg total) by mouth daily. Marland Kitchen  atenolol (TENORMIN) 25 MG tablet, Take 2 tablets (50 mg total) by mouth daily. Marland Kitchen  losartan (COZAAR) 100 MG tablet, Take 1 tablet (100 mg total) by mouth daily.  Current Outpatient Medications (Respiratory):  .  montelukast (SINGULAIR) 10 MG tablet, Take 1 tablet (10 mg total) by mouth daily. .  pseudoephedrine (SUDAFED) 30 MG tablet, Take 30 mg by mouth every 4 (four) hours as needed for congestion. .  fluticasone (FLONASE) 50 MCG/ACT nasal spray, Place 2 sprays into both nostrils daily.  Current Outpatient Medications (Analgesics):  .  aspirin 81 MG EC tablet, Take 81 mg by mouth daily. .  traMADol (ULTRAM) 50 MG tablet, TAKE 1 TABLET BY MOUTH  EVERY 6 HOURS AS NEEDED   Current Outpatient Medications (Other):  .  bismuth subsalicylate (PEPTO BISMOL) 262 MG/15ML suspension, Take 30 mLs by mouth every 4 (four) hours as needed for diarrhea or loose stools. .  famotidine-calcium carbonate-magnesium hydroxide (PEPCID COMPLETE) 10-800-165 MG chewable tablet, Chew 1 tablet by mouth daily as needed. .  pantoprazole (PROTONIX) 40 MG tablet, Take 1 tablet (40 mg total) by mouth daily. .  Probiotic Product (PROBIOTIC PO), Take by mouth daily. .  Vitamin D, Ergocalciferol, (DRISDOL) 1.25 MG (50000 UNIT) CAPS capsule, Take 1 capsule (50,000 Units total) by mouth every 7 (seven) days.   Bladder neck obstruction Also for psa  Vitamin D deficiency Last vitamin D Lab Results  Component Value Date   VD25OH 17.51 (L) 08/01/2020   Low, to start oral replacement  B12 deficiency Lab Results  Component Value Date   VITAMINB12 904 08/01/2020   Stable, cont oral replacement - b12 1000 mcg qd   Followup: Return in about 1 year (around 08/01/2021).  Cathlean Cower, MD 08/01/2020 9:21 PM [facility]

## 2020-08-01 NOTE — Assessment & Plan Note (Signed)
Last vitamin D Lab Results  Component Value Date   VD25OH 17.51 (L) 08/01/2020   Low, to start oral replacement

## 2020-08-01 NOTE — Assessment & Plan Note (Signed)
BP Readings from Last 3 Encounters:  08/01/20 136/80  07/17/19 (!) 142/84  06/28/18 126/76   Stable, pt to continue medical treatment tenormin, losartan   Current Outpatient Medications (Cardiovascular):  .  atorvastatin (LIPITOR) 40 MG tablet, Take 1 tablet (40 mg total) by mouth daily. Marland Kitchen  atenolol (TENORMIN) 25 MG tablet, Take 2 tablets (50 mg total) by mouth daily. Marland Kitchen  losartan (COZAAR) 100 MG tablet, Take 1 tablet (100 mg total) by mouth daily.  Current Outpatient Medications (Respiratory):  .  montelukast (SINGULAIR) 10 MG tablet, Take 1 tablet (10 mg total) by mouth daily. .  pseudoephedrine (SUDAFED) 30 MG tablet, Take 30 mg by mouth every 4 (four) hours as needed for congestion. .  fluticasone (FLONASE) 50 MCG/ACT nasal spray, Place 2 sprays into both nostrils daily.  Current Outpatient Medications (Analgesics):  .  aspirin 81 MG EC tablet, Take 81 mg by mouth daily. .  traMADol (ULTRAM) 50 MG tablet, TAKE 1 TABLET BY MOUTH EVERY 6 HOURS AS NEEDED   Current Outpatient Medications (Other):  .  bismuth subsalicylate (PEPTO BISMOL) 262 MG/15ML suspension, Take 30 mLs by mouth every 4 (four) hours as needed for diarrhea or loose stools. .  famotidine-calcium carbonate-magnesium hydroxide (PEPCID COMPLETE) 10-800-165 MG chewable tablet, Chew 1 tablet by mouth daily as needed. .  pantoprazole (PROTONIX) 40 MG tablet, Take 1 tablet (40 mg total) by mouth daily. .  Probiotic Product (PROBIOTIC PO), Take by mouth daily. .  Vitamin D, Ergocalciferol, (DRISDOL) 1.25 MG (50000 UNIT) CAPS capsule, Take 1 capsule (50,000 Units total) by mouth every 7 (seven) days.

## 2020-08-01 NOTE — Patient Instructions (Signed)

## 2020-08-01 NOTE — Assessment & Plan Note (Signed)
Lab Results  Component Value Date   VITAMINB12 904 08/01/2020   Stable, cont oral replacement - b12 1000 mcg qd

## 2020-08-01 NOTE — Assessment & Plan Note (Signed)
Lab Results  Component Value Date   LDLCALC 103 (H) 08/01/2020   Stable, pt to continue current statin lipitor 40

## 2020-08-01 NOTE — Assessment & Plan Note (Signed)
Also for psa

## 2020-08-01 NOTE — Assessment & Plan Note (Signed)
Lab Results  Component Value Date   HGBA1C 5.9 08/01/2020   Stable, pt to continue current medical treatment  - diet, wt control

## 2020-08-05 ENCOUNTER — Other Ambulatory Visit: Payer: Self-pay | Admitting: Internal Medicine

## 2020-08-05 ENCOUNTER — Encounter: Payer: Self-pay | Admitting: Internal Medicine

## 2020-08-05 NOTE — Telephone Encounter (Signed)
Please refill as per office routine med refill policy (all routine meds refilled for 3 mo or monthly per pt preference up to one year from last visit, then month to month grace period for 3 mo, then further med refills will have to be denied)  

## 2020-08-06 DIAGNOSIS — Z23 Encounter for immunization: Secondary | ICD-10-CM | POA: Diagnosis not present

## 2020-08-07 DIAGNOSIS — H43812 Vitreous degeneration, left eye: Secondary | ICD-10-CM | POA: Diagnosis not present

## 2020-09-17 ENCOUNTER — Telehealth: Payer: Self-pay | Admitting: Internal Medicine

## 2020-09-17 NOTE — Telephone Encounter (Signed)
1.Medication Requested: traMADol (ULTRAM) 50 MG tablet    2. Pharmacy (Name, Street, [place]): [facility] ([facility] [place] - [address]  3. On Med List: yes   4. Last Visit with PCP: 08-01-20  5. Next visit date with PCP: 08-05-21   Agent: Please be advised that RX refills may take up to 3 business days. We ask that you follow-up with your pharmacy.

## 2020-09-19 MED ORDER — TRAMADOL HCL 50 MG PO TABS: 50.0000 mg | ORAL_TABLET | Freq: Four times a day (QID) | ORAL | 2 refills | Status: DC | PRN

## 2020-09-19 NOTE — Telephone Encounter (Signed)
PMP done last filled 08/13/20.

## 2020-10-14 ENCOUNTER — Other Ambulatory Visit: Payer: Self-pay | Admitting: Internal Medicine

## 2020-10-29 ENCOUNTER — Other Ambulatory Visit: Payer: Self-pay

## 2020-10-29 ENCOUNTER — Other Ambulatory Visit: Payer: Self-pay | Admitting: Internal Medicine

## 2020-10-29 MED ORDER — ATORVASTATIN CALCIUM 40 MG PO TABS: 40.0000 mg | ORAL_TABLET | Freq: Every day | ORAL | 2 refills | Status: DC

## 2020-11-14 DIAGNOSIS — U071 COVID-19: Secondary | ICD-10-CM | POA: Diagnosis not present

## 2020-12-24 DIAGNOSIS — R350 Frequency of micturition: Secondary | ICD-10-CM | POA: Diagnosis not present

## 2020-12-24 DIAGNOSIS — N401 Enlarged prostate with lower urinary tract symptoms: Secondary | ICD-10-CM | POA: Diagnosis not present

## 2021-01-02 ENCOUNTER — Telehealth: Payer: Self-pay | Admitting: Internal Medicine

## 2021-01-02 NOTE — Telephone Encounter (Signed)
Left message for patient to call me back at [phone] to schedule Medicare Annual Wellness Visit   Last AWV  12/21/18  Please schedule at anytime with [facility] if patient calls the office back.    40 Minutes appointment   Any questions, please call me at [phone]

## 2021-01-14 DIAGNOSIS — Z23 Encounter for immunization: Secondary | ICD-10-CM | POA: Diagnosis not present

## 2021-01-16 ENCOUNTER — Ambulatory Visit (INDEPENDENT_AMBULATORY_CARE_PROVIDER_SITE_OTHER): Payer: Medicare Other

## 2021-01-16 ENCOUNTER — Other Ambulatory Visit: Payer: Self-pay

## 2021-01-16 VITALS — BP 118/60 | HR 63 | Temp 98.3°F | Ht 75.0 in | Wt 220.2 lb

## 2021-01-16 DIAGNOSIS — Z0000 Encounter for general adult medical examination without abnormal findings: Secondary | ICD-10-CM | POA: Diagnosis not present

## 2021-01-16 NOTE — Progress Notes (Signed)
Subjective:   Troy [name] is a 75 y.o. male who presents for Medicare Annual/Subsequent preventive examination.  Review of Systems     Cardiac Risk Factors include: advanced age (>53mn, >>46women);dyslipidemia;hypertension;family history of premature cardiovascular disease;male gender     Objective:    Today's Vitals   01/16/21 0838  BP: 118/60  Pulse: 63  Temp: 98.3 F (36.8 C)  SpO2: 97%  Weight: 220 lb 3.2 oz (99.9 kg)  Height: '6\' 3"'$  (1.905 m)  PainSc: 0-No pain   Body mass index is 27.52 kg/m.  Advanced Directives 01/16/2021 12/21/2018 10/13/2017 10/13/2016 08/29/2015 12/04/2011 12/01/2011  Does Patient Have a Medical Advance Directive? Yes Yes Yes Yes No Patient does not have advance directive;Patient would like information Patient does not have advance directive;Patient would like information  Type of Advance Directive Living will;Healthcare Power of [place] - - -  Does patient want to make changes to medical advance directive? No - Patient declined - - - - - -  Copy of Healthcare Power of Attorney in Chart? - No - copy requested No - copy requested No - copy requested - - -  Would patient like information on creating a medical advance directive? - - - - - Advance directive packet given Other (Comment)  Pre-existing out of facility DNR order (yellow form or pink MOST form) - - - - - No -    Current Medications (verified) Outpatient Encounter Medications as of 01/16/2021  Medication Sig   aspirin 81 MG EC tablet Take 81 mg by mouth daily.   atenolol (TENORMIN) 25 MG tablet Take 2 tablets (50 mg total) by mouth daily.   atorvastatin (LIPITOR) 40 MG tablet Take 1 tablet (40 mg total) by mouth daily.   bismuth subsalicylate (PEPTO BISMOL) 262 MG/15ML suspension Take 30 mLs by mouth every 4 (four) hours as needed for diarrhea or loose stools.    famotidine-calcium carbonate-magnesium hydroxide (PEPCID COMPLETE) 10-800-165 MG chewable tablet Chew 1 tablet by mouth daily as needed.   fluticasone (FLONASE) 50 MCG/ACT nasal spray Place 2 sprays into both nostrils daily.   losartan (COZAAR) 100 MG tablet Take 1 tablet (100 mg total) by mouth daily.   montelukast (SINGULAIR) 10 MG tablet Take 1 tablet (10 mg total) by mouth daily.   pantoprazole (PROTONIX) 40 MG tablet Take 1 tablet by mouth once daily   Probiotic Product (PROBIOTIC PO) Take by mouth daily.   pseudoephedrine (SUDAFED) 30 MG tablet Take 30 mg by mouth every 4 (four) hours as needed for congestion.   traMADol (ULTRAM) 50 MG tablet Take 1 tablet (50 mg total) by mouth every 6 (six) hours as needed.   Vitamin D, Ergocalciferol, (DRISDOL) 1.25 MG (50000 UNIT) CAPS capsule Take 1 capsule (50,000 Units total) by mouth every 7 (seven) days.   No facility-administered encounter medications on file as of 01/16/2021.    Allergies (verified) Celebrex [celecoxib], Cyclobenzaprine hcl, and Mobic [meloxicam]   History: Past Medical History:  Diagnosis Date   ALLERGIC RHINITIS 05/04/2007   BACK PAIN, LUMBAR 02/17/2010   CHEST PAIN 07/15/2009   CONSTIPATION 02/17/2010   Heart murmur    remote hx of murmur   HYPERLIPIDEMIA 05/04/2007   HYPERTENSION 05/04/2007   Interstitial cystitis 08/30/2013   Lumbar disc disease 10/01/2011   NECK PAIN, ACUTE 07/15/2009   Prostate enlargement    PSVT 05/04/2007   Skin cancer 08/30/2013   Basal  cell x 2, dec 2014   Spermatocele    VERTIGO 05/04/2007   Past Surgical History:  Procedure Laterality Date   APPENDECTOMY     KNEE ARTHROSCOPY  30 yrs ago   s/p dental implant left upper jaw  11/09   also rt lower jaw   s/p RK surgery bilat     SPERMATOCELECTOMY  12/04/2011   Procedure: SPERMATOCELECTOMY;  Surgeon: Hanley Ben, MD;  Location: [facility];  Service: Urology;  Laterality: Left;  1 hour requested for this case     TONSILLECTOMY     Family History  Problem Relation Age of Onset   Cancer Mother        breast cancer   Dementia Mother    Hypertension Mother    Hyperlipidemia Other    Diabetes Other    Heart disease Other    Social History   Socioeconomic History   Marital status: Married    Spouse name: Not on file   Number of children: 1   Years of education: Not on file   Highest education level: Not on file  Occupational History   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  Tobacco Use   Smoking status: Never   Smokeless tobacco: Never  Vaping Use   Vaping Use: Never used  Substance and Sexual Activity   Alcohol use: No    Alcohol/week: 0.0 standard drinks   Drug use: No   Sexual activity: Not Currently  Other Topics Concern   Not on file  Social History Narrative   Not on file   Social Determinants of Health   Financial Resource Strain: Low Risk    Difficulty of Paying Living Expenses: Not hard at all  Food Insecurity: No Food Insecurity   Worried About Charity fundraiser in the Last Year: Never true   Lane in the Last Year: Never true  Transportation Needs: No Transportation Needs   Lack of Transportation (Medical): No   Lack of Transportation (Non-Medical): No  Physical Activity: Sufficiently Active   Days of Exercise per Week: 5 days   Minutes of Exercise per Session: 30 min  Stress: No Stress Concern Present   Feeling of Stress : Not at all  Social Connections: Socially Integrated   Frequency of Communication with Friends and Family: More than three times a week   Frequency of Social Gatherings with Friends and Family: Twice a week   Attends Religious Services: 1 to 4 times per year   Active Member of [employer] or Organizations: Yes   Attends Archivist Meetings: 1 to 4 times per year   Marital Status: Married    Tobacco Counseling Counseling given: Not Answered   Clinical Intake:  Pre-visit preparation completed:  Yes  Pain : No/denies pain Pain Score: 0-No pain     BMI - recorded: 27.52 Nutritional Status: BMI 25 -29 Overweight Nutritional Risks: None Diabetes: No  How often do you need to have someone help you when you read instructions, pamphlets, or other written materials from your doctor or pharmacy?: 1 - Never What is the last grade level you completed in school?: Graduate School  Diabetic? no  Interpreter Needed?: No  Information entered by :: Lisette Abu, LPN   Activities of Daily Living In your present state of health, do you have any difficulty performing the following activities: 01/16/2021 08/01/2020  Hearing? N N  Vision? N N  Difficulty concentrating or making decisions? N N  Walking or climbing stairs? N N  Dressing or bathing? N N  Doing errands, shopping? N N  Preparing Food and eating ? N -  Using the Toilet? N -  In the past six months, have you accidently leaked urine? N -  Do you have problems with loss of bowel control? N -  Managing your Medications? N -  Managing your Finances? N -  Housekeeping or managing your Housekeeping? N -  Some recent data might be hidden    Patient Care Team: Biagio Borg, MD as PCP - Gaylyn Cheers, MD as Consulting Physician (Dermatology) Latanya Maudlin, MD as Consulting Physician (Orthopedic Surgery) Ardis Hughs, MD as Attending Physician (Urology)  Indicate any recent Medical Services you may have received from other than [facility] providers in the past year (date may be approximate).     Assessment:   This is a routine wellness examination for Troy [name].  Hearing/Vision screen No results found.  Dietary issues and exercise activities discussed: Current Exercise Habits: Home exercise routine, Type of exercise: walking, Time (Minutes): 30, Frequency (Times/Week): 5, Weekly Exercise (Minutes/Week): 150, Intensity: Moderate, Exercise limited by: None identified   Goals Addressed   None   Depression  Screen PHQ 2/9 Scores 01/16/2021 08/01/2020 08/01/2020 07/17/2019 12/21/2018 10/13/2017 09/30/2017  PHQ - 2 Score 0 0 0 0 0 0 0    Fall Risk Fall Risk  01/16/2021 08/01/2020 08/01/2020 07/17/2019 12/21/2018  Falls in the past year? 0 0 0 1 1  Number falls in past yr: 0 - 0 0 0  Comment - - - - -  Injury with Fall? 0 - 0 0 0  Risk for fall due to : No Fall Risks - - - Impaired mobility;Impaired balance/gait  Follow up Falls evaluation completed - - - Falls prevention discussed    FALL RISK PREVENTION PERTAINING TO THE HOME:  Any stairs in or around the home? Yes  If so, are there any without handrails? No  Home free of loose throw rugs in walkways, pet beds, electrical cords, etc? Yes  Adequate lighting in your home to reduce risk of falls? Yes   ASSISTIVE DEVICES UTILIZED TO PREVENT FALLS:  Life alert? No  Use of a cane, walker or w/c? No  Grab bars in the bathroom? Yes  Shower chair or bench in shower? Yes  Elevated toilet seat or a handicapped toilet? Yes   TIMED UP AND GO:  Was the test performed? Yes .  Length of time to ambulate 10 feet: 5 sec.   Gait steady and fast without use of assistive device  Cognitive Function: Normal cognitive status assessed by direct observation by this Nurse Health Advisor. No abnormalities found.          Immunizations Immunization History  Administered Date(s) Administered   H1N1 04/10/2008   Influenza Whole 05/04/2007, 03/08/2009, 02/17/2010, 01/07/2011   Influenza, High Dose Seasonal PF 01/29/2014, 02/22/2018   PFIZER(Purple Top)SARS-COV-2 Vaccination 06/09/2019, 07/04/2019, 01/31/2020, 02/05/2020, 01/14/2021   Pneumococcal Conjugate-13 08/30/2013   Pneumococcal Polysaccharide-23 11/25/2006, 08/29/2015   Td 04/10/2008   Zoster, Live 04/10/2008    TDAP status: Due, Education has been provided regarding the importance of this vaccine. Advised may receive this vaccine at local pharmacy or Health Dept. Aware to provide a copy of the  vaccination record if obtained from local pharmacy or Health Dept. Verbalized acceptance and understanding.  Flu Vaccine status: Up to date  Pneumococcal vaccine status: Up to date  Covid-19 vaccine status: Completed vaccines  Qualifies for Shingles Vaccine? Yes   Zostavax completed Yes   Shingrix Completed?: No.    Education has been provided regarding the importance of this vaccine. Patient has been advised to call insurance company to determine out of pocket expense if they have not yet received this vaccine. Advised may also receive vaccine at local pharmacy or Health Dept. Verbalized acceptance and understanding.  Screening Tests Health Maintenance  Topic Date Due   Zoster Vaccines- Shingrix (1 of 2) Never done   INFLUENZA VACCINE  12/02/2020   TETANUS/TDAP  08/01/2021 (Originally 04/10/2018)   COVID-19 Vaccine  Completed   Hepatitis C Screening  Completed   PNA vac Low Risk Adult  Completed   HPV VACCINES  Aged Out    Health Maintenance  Health Maintenance Due  Topic Date Due   Zoster Vaccines- Shingrix (1 of 2) Never done   INFLUENZA VACCINE  12/02/2020    Colorectal cancer screening: Type of screening: Cologuard. Completed 05/03/2018. Repeat every 3 years  Lung Cancer Screening: (Low Dose CT Chest recommended if Age 6-80 years, 30 pack-year currently smoking OR have quit w/in 15years.) does not qualify.   Lung Cancer Screening Referral: no  Additional Screening:  Hepatitis C Screening: does qualify; Completed yes  Vision Screening: Recommended annual ophthalmology exams for early detection of glaucoma and other disorders of the eye. Is the patient up to date with their annual eye exam?  Yes  Who is the provider or what is the name of the office in which the patient attends annual eye exams? Dr. Marshall Cork If pt is not established with a provider, would they like to be referred to a provider to establish care? No .   Dental Screening: Recommended annual  dental exams for proper oral hygiene  Community Resource Referral / Chronic Care Management: CRR required this visit?  No   CCM required this visit?  No      Plan:     I have personally reviewed and noted the following in the patient's chart:   Medical and social history Use of alcohol, tobacco or illicit drugs  Current medications and supplements including opioid prescriptions. Patient is not currently taking opioid prescriptions. Functional ability and status Nutritional status Physical activity Advanced directives List of other physicians Hospitalizations, surgeries, and ER visits in previous 12 months Vitals Screenings to include cognitive, depression, and falls Referrals and appointments  In addition, I have reviewed and discussed with patient certain preventive protocols, quality metrics, and best practice recommendations. A written personalized care plan for preventive services as well as general preventive health recommendations were provided to patient.     Sheral Flow, LPN   [id number]   Nurse Notes:  Hearing Screening - Comments:: Patient denied any hearing difficulty. Vision Screening - Comments:: Annual eye exams done by Dr. Marshall Cork.

## 2021-01-16 NOTE — Patient Instructions (Addendum)
Troy [name] , Thank you for taking time to come for your Medicare Wellness Visit. I appreciate your ongoing commitment to your health goals. Please review the following plan we discussed and let me know if I can assist you in the future.   Screening recommendations/referrals: Colonoscopy: last done 04/29/2018 Recommended yearly ophthalmology/optometry visit for glaucoma screening and checkup Recommended yearly dental visit for hygiene and checkup  Vaccinations: Influenza vaccine: 01/31/2020; wait until October 2022 Pneumococcal vaccine: 08/30/2013, 08/29/2015 Tdap vaccine: 04/10/2008; due every 10 years (overdue) Shingles vaccine: Zoster 2009 Covid-19: 06/09/2019, 07/04/2019, 01/31/2020, 08/06/2020, 01/14/2021  Advanced directives: Please bring a copy of your health care power of attorney and living will to the office at your convenience.  Conditions/risks identified: Yes; Client understands the importance of follow-up with providers by attending scheduled visits and discussed goals to eat healthier, increase physical activity, exercise the brain, socialize more, get enough sleep and make time for laughter.  Next appointment: Please schedule your next Medicare Wellness Visit with your Nurse Health Advisor in 1 year by calling [phone].  Preventive Care 80 Years and Older, Male Preventive care refers to lifestyle choices and visits with your health care provider that can promote health and wellness. What does preventive care include? A yearly physical exam. This is also called an annual well check. Dental exams once or twice a year. Routine eye exams. Ask your health care provider how often you should have your eyes checked. Personal lifestyle choices, including: Daily care of your teeth and gums. Regular physical activity. Eating a healthy diet. Avoiding tobacco and drug use. Limiting alcohol use. Practicing safe sex. Taking low doses of aspirin every day. Taking vitamin and mineral  supplements as recommended by your health care provider. What happens during an annual well check? The services and screenings done by your health care provider during your annual well check will depend on your age, overall health, lifestyle risk factors, and family history of disease. Counseling  Your health care provider may ask you questions about your: Alcohol use. Tobacco use. Drug use. Emotional well-being. Home and relationship well-being. Sexual activity. Eating habits. History of falls. Memory and ability to understand (cognition). Work and work Statistician. Screening  You may have the following tests or measurements: Height, weight, and BMI. Blood pressure. Lipid and cholesterol levels. These may be checked every 5 years, or more frequently if you are over 30 years old. Skin check. Lung cancer screening. You may have this screening every year starting at age 4 if you have a 30-pack-year history of smoking and currently smoke or have quit within the past 15 years. Fecal occult blood test (FOBT) of the stool. You may have this test every year starting at age 49. Flexible sigmoidoscopy or colonoscopy. You may have a sigmoidoscopy every 5 years or a colonoscopy every 10 years starting at age 85. Prostate cancer screening. Recommendations will vary depending on your family history and other risks. Hepatitis C blood test. Hepatitis B blood test. Sexually transmitted disease (STD) testing. Diabetes screening. This is done by checking your blood sugar (glucose) after you have not eaten for a while (fasting). You may have this done every 1-3 years. Abdominal aortic aneurysm (AAA) screening. You may need this if you are a current or former smoker. Osteoporosis. You may be screened starting at age 70 if you are at high risk. Talk with your health care provider about your test results, treatment options, and if necessary, the need for more tests. Vaccines  Your health care  provider  may recommend certain vaccines, such as: Influenza vaccine. This is recommended every year. Tetanus, diphtheria, and acellular pertussis (Tdap, Td) vaccine. You may need a Td booster every 10 years. Zoster vaccine. You may need this after age 54. Pneumococcal 13-valent conjugate (PCV13) vaccine. One dose is recommended after age 32. Pneumococcal polysaccharide (PPSV23) vaccine. One dose is recommended after age 35. Talk to your health care provider about which screenings and vaccines you need and how often you need them. This information is not intended to replace advice given to you by your health care provider. Make sure you discuss any questions you have with your health care provider. Document Released: 05/17/2015 Document Revised: 01/08/2016 Document Reviewed: 02/19/2015 Elsevier Interactive Patient Education  2017 [place] Prevention in the Home Falls can cause injuries. They can happen to people of all ages. There are many things you can do to make your home safe and to help prevent falls. What can I do on the outside of my home? Regularly fix the edges of walkways and driveways and fix any cracks. Remove anything that might make you trip as you walk through a door, such as a raised step or threshold. Trim any bushes or trees on the path to your home. Use bright outdoor lighting. Clear any walking paths of anything that might make someone trip, such as rocks or tools. Regularly check to see if handrails are loose or broken. Make sure that both sides of any steps have handrails. Any raised decks and porches should have guardrails on the edges. Have any leaves, snow, or ice cleared regularly. Use sand or salt on walking paths during winter. Clean up any spills in your garage right away. This includes oil or grease spills. What can I do in the bathroom? Use night lights. Install grab bars by the toilet and in the tub and shower. Do not use towel bars as grab bars. Use  non-skid mats or decals in the tub or shower. If you need to sit down in the shower, use a plastic, non-slip stool. Keep the floor dry. Clean up any water that spills on the floor as soon as it happens. Remove soap buildup in the tub or shower regularly. Attach bath mats securely with double-sided non-slip rug tape. Do not have throw rugs and other things on the floor that can make you trip. What can I do in the bedroom? Use night lights. Make sure that you have a light by your bed that is easy to reach. Do not use any sheets or blankets that are too big for your bed. They should not hang down onto the floor. Have a firm chair that has side arms. You can use this for support while you get dressed. Do not have throw rugs and other things on the floor that can make you trip. What can I do in the kitchen? Clean up any spills right away. Avoid walking on wet floors. Keep items that you use a lot in easy-to-reach places. If you need to reach something above you, use a strong step stool that has a grab bar. Keep electrical cords out of the way. Do not use floor polish or wax that makes floors slippery. If you must use wax, use non-skid floor wax. Do not have throw rugs and other things on the floor that can make you trip. What can I do with my stairs? Do not leave any items on the stairs. Make sure that there are handrails on both  sides of the stairs and use them. Fix handrails that are broken or loose. Make sure that handrails are as long as the stairways. Check any carpeting to make sure that it is firmly attached to the stairs. Fix any carpet that is loose or worn. Avoid having throw rugs at the top or bottom of the stairs. If you do have throw rugs, attach them to the floor with carpet tape. Make sure that you have a light switch at the top of the stairs and the bottom of the stairs. If you do not have them, ask someone to add them for you. What else can I do to help prevent falls? Wear  shoes that: Do not have high heels. Have rubber bottoms. Are comfortable and fit you well. Are closed at the toe. Do not wear sandals. If you use a stepladder: Make sure that it is fully opened. Do not climb a closed stepladder. Make sure that both sides of the stepladder are locked into place. Ask someone to hold it for you, if possible. Clearly mark and make sure that you can see: Any grab bars or handrails. First and last steps. Where the edge of each step is. Use tools that help you move around (mobility aids) if they are needed. These include: Canes. Walkers. Scooters. Crutches. Turn on the lights when you go into a dark area. Replace any light bulbs as soon as they burn out. Set up your furniture so you have a clear path. Avoid moving your furniture around. If any of your floors are uneven, fix them. If there are any pets around you, be aware of where they are. Review your medicines with your doctor. Some medicines can make you feel dizzy. This can increase your chance of falling. Ask your doctor what other things that you can do to help prevent falls. This information is not intended to replace advice given to you by your health care provider. Make sure you discuss any questions you have with your health care provider. Document Released: 02/14/2009 Document Revised: 09/26/2015 Document Reviewed: 05/25/2014 Elsevier Interactive Patient Education  2017 [employer].

## 2021-01-21 ENCOUNTER — Other Ambulatory Visit: Payer: Self-pay | Admitting: Internal Medicine

## 2021-01-21 NOTE — Telephone Encounter (Signed)
Please refill as per office routine med refill policy (all routine meds to be refilled for 3 mo or monthly (per pt preference) up to one year from last visit, then month to month grace period for 3 mo, then further med refills will have to be denied) ? ?

## 2021-01-30 DIAGNOSIS — Z23 Encounter for immunization: Secondary | ICD-10-CM | POA: Diagnosis not present

## 2021-03-03 ENCOUNTER — Encounter: Payer: Self-pay | Admitting: Emergency Medicine

## 2021-03-03 ENCOUNTER — Other Ambulatory Visit: Payer: Self-pay

## 2021-03-03 ENCOUNTER — Ambulatory Visit (INDEPENDENT_AMBULATORY_CARE_PROVIDER_SITE_OTHER): Payer: Medicare Other | Admitting: Emergency Medicine

## 2021-03-03 VITALS — BP 138/78 | HR 75 | Ht 75.0 in | Wt 219.0 lb

## 2021-03-03 DIAGNOSIS — I4510 Unspecified right bundle-branch block: Secondary | ICD-10-CM | POA: Insufficient documentation

## 2021-03-03 DIAGNOSIS — R079 Chest pain, unspecified: Secondary | ICD-10-CM

## 2021-03-03 NOTE — Patient Instructions (Signed)
Nonspecific Chest Pain Chest pain can be caused by many different conditions. Some causes of chest pain can be life-threatening. These will require treatment right away. Serious causes of chest pain include: Heart attack. A tear in the body's main blood vessel. Redness and swelling (inflammation) around your heart. Blood clot in your lungs. Other causes of chest pain may not be so serious. These include: Heartburn. Anxiety or stress. Damage to bones or muscles in your chest. Lung infections. Chest pain can feel like: Pain or discomfort in your chest. Crushing, pressure, aching, or squeezing pain. Burning or tingling. Dull or sharp pain that is worse when you move, cough, or take a deep breath. Pain or discomfort that is also felt in your back, neck, jaw, shoulder, or arm, or pain that spreads to any of these areas. It is hard to know whether your pain is caused by something that is serious or something that is not so serious. So it is important to see your doctor right away if you have chest pain. Follow these instructions at home: Medicines Take over-the-counter and prescription medicines only as told by your doctor. If you were prescribed an antibiotic medicine, take it as told by your doctor. Do not stop taking the antibiotic even if you start to feel better. Lifestyle  Rest as told by your doctor. Do not use any products that contain nicotine or tobacco, such as cigarettes, e-cigarettes, and chewing tobacco. If you need help quitting, ask your doctor. Do not drink alcohol. Make lifestyle changes as told by your doctor. These may include: Getting regular exercise. Ask your doctor what activities are safe for you. Eating a heart-healthy diet. A diet and nutrition specialist (dietitian) can help you to learn healthy eating options. Staying at a healthy weight. Treating diabetes or high blood pressure, if needed. Lowering your stress. Activities such as yoga and relaxation techniques  can help. General instructions Pay attention to any changes in your symptoms. Tell your doctor about them or any new symptoms. Avoid any activities that cause chest pain. Keep all follow-up visits as told by your doctor. This is important. You may need more testing if your chest pain does not go away. Contact a doctor if: Your chest pain does not go away. You feel depressed. You have a fever. Get help right away if: Your chest pain is worse. You have a cough that gets worse, or you cough up blood. You have very bad (severe) pain in your belly (abdomen). You pass out (faint). You have either of these for no clear reason: Sudden chest discomfort. Sudden discomfort in your arms, back, neck, or jaw. You have shortness of breath at any time. You suddenly start to sweat, or your skin gets clammy. You feel sick to your stomach (nauseous). You throw up (vomit). You suddenly feel lightheaded or dizzy. You feel very weak or tired. Your heart starts to beat fast, or it feels like it is skipping beats. These symptoms may be an emergency. Do not wait to see if the symptoms will go away. Get medical help right away. Call your local emergency services (911 in the [place] Do not drive yourself to the hospital. Summary Chest pain can be caused by many different conditions. The cause may be serious and need treatment right away. If you have chest pain, see your doctor right away. Follow your doctor's instructions for taking medicines and making lifestyle changes. Keep all follow-up visits as told by your doctor. This includes visits for any further   testing if your chest pain does not go away. Be sure to know the signs that show that your condition has become worse. Get help right away if you have these symptoms. This information is not intended to replace advice given to you by your health care provider. Make sure you discuss any questions you have with your health care provider. Document Revised:  07/04/2020 Document Reviewed: 07/04/2020 Elsevier Patient Education  [place].

## 2021-03-03 NOTE — Assessment & Plan Note (Signed)
Differential diagnosis discussed with patient.  Clinically stable but symptoms could be cardiac in origin.  Patient has history of hypertension and dyslipidemia.  Stable and normal vital signs.  No red flag signs or symptoms however EKG shows a right bundle branch block of uncertain duration.  Needs urgent cardiology evaluation.  Referral placed today.

## 2021-03-03 NOTE — Progress Notes (Signed)
Troy [name] 75 y.o.   Chief Complaint  Patient presents with   Muscle Pain    Pulled chest muscle, L side. X 1 week.    HISTORY OF PRESENT ILLNESS: This is a 75 y.o. male complaining of left-sided sharp chest pain for the last week worse with movement. He regularly works out with [employer] and thinks he pulled a muscle.  No associated symptoms.  No pain radiation. Pain is sharp and comes and goes.  No recent cardiology evaluation. No other complaints or medical concerns today.  Muscle Pain Associated symptoms include chest pain. Pertinent negatives include no abdominal pain, diarrhea, dysuria, fever, headaches, nausea, rash, shortness of breath or vomiting.    Prior to Admission medications   Medication Sig Start Date End Date Taking? Authorizing Provider  aspirin 81 MG EC tablet Take 81 mg by mouth daily.   Yes [provider]  atenolol (TENORMIN) 25 MG tablet Take 2 tablets (50 mg total) by mouth daily. 08/01/20  Yes Biagio Borg, MD  atorvastatin (LIPITOR) 40 MG tablet Take 1 tablet (40 mg total) by mouth daily. 10/29/20  Yes Biagio Borg, MD  bismuth subsalicylate (PEPTO BISMOL) 262 MG/15ML suspension Take 30 mLs by mouth every 4 (four) hours as needed for diarrhea or loose stools. 09/30/17  Yes Biagio Borg, MD  famotidine-calcium carbonate-magnesium hydroxide (PEPCID COMPLETE) 10-800-165 MG chewable tablet Chew 1 tablet by mouth daily as needed.   Yes [provider]  losartan (COZAAR) 100 MG tablet Take 1 tablet (100 mg total) by mouth daily. 08/01/20  Yes Biagio Borg, MD  montelukast (SINGULAIR) 10 MG tablet Take 1 tablet (10 mg total) by mouth daily. 09/30/17  Yes Biagio Borg, MD  pantoprazole (PROTONIX) 40 MG tablet Take 1 tablet by mouth once daily 01/23/21  Yes Biagio Borg, MD  Probiotic Product (PROBIOTIC PO) Take by mouth daily.   Yes [provider]  pseudoephedrine (SUDAFED) 30 MG tablet Take 30 mg by mouth every 4 (four) hours as needed  for congestion.   Yes [provider]  traMADol (ULTRAM) 50 MG tablet Take 1 tablet (50 mg total) by mouth every 6 (six) hours as needed. 09/19/20  Yes Biagio Borg, MD  Vitamin D, Ergocalciferol, (DRISDOL) 1.25 MG (50000 UNIT) CAPS capsule Take 1 capsule (50,000 Units total) by mouth every 7 (seven) days. 07/19/19  Yes Biagio Borg, MD  fluticasone [facility]) 50 MCG/ACT nasal spray Place 2 sprays into both nostrils daily. 09/30/17 09/30/18  Biagio Borg, MD    Allergies  Allergen Reactions   Celebrex [Celecoxib] Anaphylaxis    Oral sores   Cyclobenzaprine Hcl     REACTION: faical skin rash, 'hard cysts'   Mobic [Meloxicam] Other (See Comments)    Sores in mouth    Patient Active Problem List   Diagnosis Date Noted   Vitamin D deficiency 08/01/2020   B12 deficiency 08/01/2020   Tick bite, infected 08/11/2018   Hyperglycemia 09/30/2017   Interstitial cystitis 08/30/2013   Skin cancer 08/30/2013   Varicocele 10/04/2011   Lumbar disc disease 10/01/2011   Renal cyst 03/25/2011   Preventative health care 10/22/2010   Cervical spondylosis 10/22/2010   Bladder neck obstruction 10/22/2010   BACK PAIN, LUMBAR 02/17/2010   Hyperlipidemia 05/04/2007   Essential hypertension 05/04/2007   PSVT 05/04/2007   Allergic rhinitis 05/04/2007    Past Medical History:  Diagnosis Date   ALLERGIC RHINITIS 05/04/2007   BACK PAIN, LUMBAR 02/17/2010  CHEST PAIN 07/15/2009   CONSTIPATION 02/17/2010   Heart murmur    remote hx of murmur   HYPERLIPIDEMIA 05/04/2007   HYPERTENSION 05/04/2007   Interstitial cystitis 08/30/2013   Lumbar disc disease 10/01/2011   NECK PAIN, ACUTE 07/15/2009   Prostate enlargement    PSVT 05/04/2007   Skin cancer 08/30/2013   Basal cell x 2, dec 2014   Spermatocele    VERTIGO 05/04/2007    Past Surgical History:  Procedure Laterality Date   APPENDECTOMY     KNEE ARTHROSCOPY  30 yrs ago   s/p dental implant left upper jaw  11/09   also rt lower jaw    s/p RK surgery bilat     SPERMATOCELECTOMY  12/04/2011   Procedure: SPERMATOCELECTOMY;  Surgeon: Hanley Ben, MD;  Location: [place];  Service: Urology;  Laterality: Left;  1 hour requested for this case    TONSILLECTOMY      Social History   Socioeconomic History   Marital status: Married    Spouse name: Not on file   Number of children: 1   Years of education: Not on file   Highest education level: Not on file  Occupational History   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  Tobacco Use   Smoking status: Never   Smokeless tobacco: Never  Vaping Use   Vaping Use: Never used  Substance and Sexual Activity   Alcohol use: No    Alcohol/week: 0.0 standard drinks   Drug use: No   Sexual activity: Not Currently  Other Topics Concern   Not on file  Social History Narrative   Not on file   Social Determinants of Health   Financial Resource Strain: Low Risk    Difficulty of Paying Living Expenses: Not hard at all  Food Insecurity: No Food Insecurity   Worried About Charity fundraiser in the Last Year: Never true   Pinch in the Last Year: Never true  Transportation Needs: No Transportation Needs   Lack of Transportation (Medical): No   Lack of Transportation (Non-Medical): No  Physical Activity: Sufficiently Active   Days of Exercise per Week: 5 days   Minutes of Exercise per Session: 30 min  Stress: No Stress Concern Present   Feeling of Stress : Not at all  Social Connections: Socially Integrated   Frequency of Communication with Friends and Family: More than three times a week   Frequency of Social Gatherings with Friends and Family: Twice a week   Attends Religious Services: 1 to 4 times per year   Active Member of [employer] or Organizations: Yes   Attends Archivist Meetings: 1 to 4 times per year   Marital Status: Married  Human resources officer Violence: Not At Risk   Fear of Current or Ex-Partner: No    Emotionally Abused: No   Physically Abused: No   Sexually Abused: No    Family History  Problem Relation Age of Onset   Cancer Mother        breast cancer   Dementia Mother    Hypertension Mother    Hyperlipidemia Other    Diabetes Other    Heart disease Other      Review of Systems  Constitutional: Negative.  Negative for chills and fever.  HENT: Negative.  Negative for congestion and sore throat.   Respiratory: Negative.  Negative for cough and shortness of breath.   Cardiovascular:  Positive for chest pain. Negative  for palpitations and leg swelling.  Gastrointestinal:  Negative for abdominal pain, diarrhea, nausea and vomiting.  Genitourinary: Negative.  Negative for dysuria and hematuria.  Skin: Negative.  Negative for rash.  Neurological: Negative.  Negative for dizziness and headaches.  All other systems reviewed and are negative.   Physical Exam Vitals reviewed.  Constitutional:      Appearance: Normal appearance.  HENT:     Head: Normocephalic.  Eyes:     Extraocular Movements: Extraocular movements intact.     Conjunctiva/sclera: Conjunctivae normal.     Pupils: Pupils are equal, round, and reactive to light.  Cardiovascular:     Rate and Rhythm: Normal rate and regular rhythm.     Pulses: Normal pulses.     Heart sounds: Normal heart sounds.  Pulmonary:     Effort: Pulmonary effort is normal.     Breath sounds: Normal breath sounds.  Abdominal:     Palpations: Abdomen is soft.     Tenderness: There is no abdominal tenderness.  Musculoskeletal:     Cervical back: Normal range of motion and neck supple.     Right lower leg: No edema.     Left lower leg: No edema.  Skin:    General: Skin is warm and dry.     Capillary Refill: Capillary refill takes less than 2 seconds.  Neurological:     General: No focal deficit present.     Mental Status: He is alert and oriented to person, place, and time.  Psychiatric:        Mood and Affect: Mood normal.         Behavior: Behavior normal.    EKG: Normal sinus rhythm with right bundle branch block and ventricular rate of 64/min.  No acute ischemic changes.  Last EKG from 2013 shows no right bundle branch block.  ASSESSMENT & PLAN: Problem List Items Addressed This Visit       Cardiovascular and Mediastinum   Right bundle branch block   Relevant Orders   Ambulatory referral to Cardiology     Other   Nonspecific chest pain - Primary    Differential diagnosis discussed with patient.  Clinically stable but symptoms could be cardiac in origin.  Patient has history of hypertension and dyslipidemia.  Stable and normal vital signs.  No red flag signs or symptoms however EKG shows a right bundle branch block of uncertain duration.  Needs urgent cardiology evaluation.  Referral placed today.      Relevant Orders   EKG 12-Lead   Ambulatory referral to Cardiology   Patient Instructions  Nonspecific Chest Pain Chest pain can be caused by many different conditions. Some causes of chest pain can be life-threatening. These will require treatment right away. Serious causes of chest pain include: Heart attack. A tear in the body's main blood vessel. Redness and swelling (inflammation) around your heart. Blood clot in your lungs. Other causes of chest pain may not be so serious. These include: Heartburn. Anxiety or stress. Damage to bones or muscles in your chest. Lung infections. Chest pain can feel like: Pain or discomfort in your chest. Crushing, pressure, aching, or squeezing pain. Burning or tingling. Dull or sharp pain that is worse when you move, cough, or take a deep breath. Pain or discomfort that is also felt in your back, neck, jaw, shoulder, or arm, or pain that spreads to any of these areas. It is hard to know whether your pain is caused by something that is serious or  something that is not so serious. So it is important to see your doctor right away if you have chest pain. Follow these  instructions at home: Medicines Take over-the-counter and prescription medicines only as told by your doctor. If you were prescribed an antibiotic medicine, take it as told by your doctor. Do not stop taking the antibiotic even if you start to feel better. Lifestyle  Rest as told by your doctor. Do not use any products that contain nicotine or tobacco, such as cigarettes, e-cigarettes, and chewing tobacco. If you need help quitting, ask your doctor. Do not drink alcohol. Make lifestyle changes as told by your doctor. These may include: Getting regular exercise. Ask your doctor what activities are safe for you. Eating a heart-healthy diet. A diet and nutrition specialist (dietitian) can help you to learn healthy eating options. Staying at a healthy weight. Treating diabetes or high blood pressure, if needed. Lowering your stress. Activities such as yoga and relaxation techniques can help. General instructions Pay attention to any changes in your symptoms. Tell your doctor about them or any new symptoms. Avoid any activities that cause chest pain. Keep all follow-up visits as told by your doctor. This is important. You may need more testing if your chest pain does not go away. Contact a doctor if: Your chest pain does not go away. You feel depressed. You have a fever. Get help right away if: Your chest pain is worse. You have a cough that gets worse, or you cough up blood. You have very bad (severe) pain in your belly (abdomen). You pass out (faint). You have either of these for no clear reason: Sudden chest discomfort. Sudden discomfort in your arms, back, neck, or jaw. You have shortness of breath at any time. You suddenly start to sweat, or your skin gets clammy. You feel sick to your stomach (nauseous). You throw up (vomit). You suddenly feel lightheaded or dizzy. You feel very weak or tired. Your heart starts to beat fast, or it feels like it is skipping beats. These symptoms  may be an emergency. Do not wait to see if the symptoms will go away. Get medical help right away. Call your local emergency services (911 in the [place] Do not drive yourself to the hospital. Summary Chest pain can be caused by many different conditions. The cause may be serious and need treatment right away. If you have chest pain, see your doctor right away. Follow your doctor's instructions for taking medicines and making lifestyle changes. Keep all follow-up visits as told by your doctor. This includes visits for any further testing if your chest pain does not go away. Be sure to know the signs that show that your condition has become worse. Get help right away if you have these symptoms. This information is not intended to replace advice given to you by your health care provider. Make sure you discuss any questions you have with your health care provider. Document Revised: 07/04/2020 Document Reviewed: 07/04/2020 Elsevier Patient Education  2022 [place], MD [facility]

## 2021-03-04 ENCOUNTER — Ambulatory Visit (INDEPENDENT_AMBULATORY_CARE_PROVIDER_SITE_OTHER): Payer: Medicare Other | Admitting: Internal Medicine

## 2021-03-04 ENCOUNTER — Encounter: Payer: Self-pay | Admitting: Internal Medicine

## 2021-03-04 VITALS — BP 136/90 | HR 70 | Ht 74.5 in | Wt 219.0 lb

## 2021-03-04 DIAGNOSIS — I10 Essential (primary) hypertension: Secondary | ICD-10-CM | POA: Diagnosis not present

## 2021-03-04 DIAGNOSIS — R072 Precordial pain: Secondary | ICD-10-CM | POA: Diagnosis not present

## 2021-03-04 DIAGNOSIS — I4510 Unspecified right bundle-branch block: Secondary | ICD-10-CM | POA: Diagnosis not present

## 2021-03-04 DIAGNOSIS — R079 Chest pain, unspecified: Secondary | ICD-10-CM | POA: Diagnosis not present

## 2021-03-04 DIAGNOSIS — Z01812 Encounter for preprocedural laboratory examination: Secondary | ICD-10-CM

## 2021-03-04 MED ORDER — METOPROLOL TARTRATE 100 MG PO TABS: 100.0000 mg | ORAL_TABLET | Freq: Once | ORAL | 0 refills | Status: DC

## 2021-03-04 NOTE — Assessment & Plan Note (Addendum)
We will obtain a CTA to evaluate further.  I will see him back in 1 year or earlier if needed.

## 2021-03-04 NOTE — Patient Instructions (Addendum)
Medication Instructions:  No changes *If you need a refill on your cardiac medications before your next appointment, please call your pharmacy*   Lab Work: Today: BMET  If you have labs (blood work) drawn today and your tests are completely normal, you will receive your results only by: Troy [name] (if you have MyChart) OR A paper copy in the mail If you have any lab test that is abnormal or we need to change your treatment, we will call you to review the results.   Testing/Procedures: Cardiac CTA - see instructions below   Follow-Up: At [facility], you and your health needs are our priority.  As part of our continuing mission to provide you with exceptional heart care, we have created designated Provider Care Teams.  These Care Teams include your primary Cardiologist (physician) and Advanced Practice Providers (APPs -  Physician Assistants and Nurse Practitioners) who all work together to provide you with the care you need, when you need it.   Your next appointment:   12 month(s)  The format for your next appointment:   In Person  Provider:   Dr. Lenna Sciara   Other Instructions   Your cardiac CT will be scheduled at the below location:   [facility] [address] [place], [place] [address] [phone]  Please arrive at the [facility] main entrance (entrance A) of [facility] 30 minutes prior to test start time. You can use the FREE valet parking offered at the main entrance (encouraged to control the heart rate for the test) Proceed to the [facility] Radiology Department (first floor) to check-in and test prep.  Please follow these instructions carefully (unless otherwise directed):  Hold all erectile dysfunction medications at least 3 days (72 hrs) prior to test.  On the Night Before the Test: Be sure to Drink plenty of water. Do not consume any caffeinated/decaffeinated beverages or chocolate 12 hours prior to your test. Do  not take any antihistamines 12 hours prior to your test.  On the Day of the Test: Drink plenty of water until 1 hour prior to the test. Do not eat any food 4 hours prior to the test. You may take your regular medications prior to the test.  Take metoprolol (Lopressor) two hours prior to test. HOLD atenolol on the morning of the test.      After the Test: Drink plenty of water. After receiving IV contrast, you may experience a mild flushed feeling. This is normal. On occasion, you may experience a mild rash up to 24 hours after the test. This is not dangerous. If this occurs, you can take Benadryl 25 mg and increase your fluid intake. If you experience trouble breathing, this can be serious. If it is severe call 911 IMMEDIATELY. If it is mild, please call our office. If you take any of these medications: Glipizide/Metformin, Avandament, Glucavance, please do not take 48 hours after completing test unless otherwise instructed.  Please allow 2-4 weeks for scheduling of routine cardiac CTs. Some insurance companies require a pre-authorization which may delay scheduling of this test.   For non-scheduling related questions, please contact the cardiac imaging nurse navigator should you have any questions/concerns: Marchia Bond, Cardiac Imaging Nurse Navigator Gordy Clement, Cardiac Imaging Nurse Navigator [facility] Direct Office Dial: [phone]   For scheduling needs, including cancellations and rescheduling, please call [place], [phone].

## 2021-03-04 NOTE — Progress Notes (Signed)
Cardiology Office Note:    Date:  03/04/2021   ID:  BIJON MINEER, DOB July 27, 1945, MRN [id number]  PCP:  Biagio Borg, MD   [facility] Providers Cardiologist:  None     Referring MD: Horald Pollen, *   Chief Complaint:  Chest pain  History of Present Illness:    PROBLEM LIST: 1.  Hypertension 2.  Hyperlipidemia 3.  Right bundle branch block seen on EKG 03/03/2021  Troy [name] is a 75 y.o. male with the indicated medical problems here for recommendations regarding chest pain.  The patient was recently seen in his primary care provider's office due to chest pain.  The patient had been working out and believes he pulled a muscle.  An EKG was done which demonstrated a right bundle branch block.  Review of his previous EKGs demonstrate no right bundle branch block.  He was referred urgently to cardiology for an evaluation.  He describes I upper chest tightness that is worse after he eats.  It is actually better when he exerts himself.  He took a Pepcid on the advice of his daughter and this seemed to help especially prior to eating.  He is relatively active and getting in shape for reunion.  He denies any palpitations, paroxysmal atrial dyspnea, orthopnea, peripheral edema, presyncope, syncope, signs or symptoms of stroke, or severe bleeding.  He has required no emergency room visits or hospitalizations.    Previous Medical/Surgical History:    Past Medical History:  Diagnosis Date   ALLERGIC RHINITIS 05/04/2007   BACK PAIN, LUMBAR 02/17/2010   CHEST PAIN 07/15/2009   CONSTIPATION 02/17/2010   Heart murmur    remote hx of murmur   HYPERLIPIDEMIA 05/04/2007   HYPERTENSION 05/04/2007   Interstitial cystitis 08/30/2013   Lumbar disc disease 10/01/2011   NECK PAIN, ACUTE 07/15/2009   Prostate enlargement    PSVT 05/04/2007   Skin cancer 08/30/2013   Basal cell x 2, dec 2014   Spermatocele    VERTIGO 05/04/2007    Past Surgical History:  Procedure Laterality  Date   APPENDECTOMY     KNEE ARTHROSCOPY  30 yrs ago   s/p dental implant left upper jaw  11/09   also rt lower jaw   s/p RK surgery bilat     SPERMATOCELECTOMY  12/04/2011   Procedure: SPERMATOCELECTOMY;  Surgeon: Hanley Ben, MD;  Location: [facility];  Service: Urology;  Laterality: Left;  1 hour requested for this case    TONSILLECTOMY      Current Medications: Current Meds  Medication Sig   aspirin 81 MG EC tablet Take 81 mg by mouth daily.   atenolol (TENORMIN) 25 MG tablet Take 2 tablets (50 mg total) by mouth daily.   atorvastatin (LIPITOR) 40 MG tablet Take 1 tablet (40 mg total) by mouth daily.   bismuth subsalicylate (PEPTO BISMOL) 262 MG/15ML suspension Take 30 mLs by mouth every 4 (four) hours as needed for diarrhea or loose stools.   famotidine-calcium carbonate-magnesium hydroxide (PEPCID COMPLETE) 10-800-165 MG chewable tablet Chew 1 tablet by mouth daily as needed.   losartan (COZAAR) 100 MG tablet Take 1 tablet (100 mg total) by mouth daily.   montelukast (SINGULAIR) 10 MG tablet Take 1 tablet (10 mg total) by mouth daily.   pantoprazole (PROTONIX) 40 MG tablet Take 1 tablet by mouth once daily   Probiotic Product (PROBIOTIC PO) Take by mouth daily.   pseudoephedrine (SUDAFED) 30 MG tablet Take 30 mg by mouth  every 4 (four) hours as needed for congestion.   traMADol (ULTRAM) 50 MG tablet Take 1 tablet (50 mg total) by mouth every 6 (six) hours as needed.   Vitamin D, Ergocalciferol, (DRISDOL) 1.25 MG (50000 UNIT) CAPS capsule Take 1 capsule (50,000 Units total) by mouth every 7 (seven) days.     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, mental health  Tobacco Use   Smoking status: Never   Smokeless tobacco: Never  Vaping Use   Vaping Use: Never used  Substance and Sexual Activity   Alcohol use: No    Alcohol/week: 0.0 standard drinks   Drug use: No   Sexual activity: Not Currently  Other Topics Concern   Not on file  Social History Narrative   Not on file   Social Determinants of Health   Financial Resource Strain: Low Risk    Difficulty of Paying Living Expenses: Not hard at all  Food Insecurity: No Food Insecurity   Worried About Charity fundraiser in the Last Year: Never true   [place] in the Last Year: Never true  Transportation Needs: No Transportation Needs   Lack of Transportation (Medical): No   Lack of Transportation (Non-Medical): No  Physical Activity: Sufficiently Active   Days of Exercise per Week: 5 days   Minutes of Exercise per Session: 30 min  Stress: No Stress Concern Present   Feeling of Stress : Not at all  Social Connections: Socially Integrated   Frequency of Communication with Friends and Family: More than three times a week   Frequency of Social Gatherings with Friends and Family: Twice a week   Attends Religious Services: 1 to 4 times per year   Active Member of [employer] or Organizations: Yes   Attends Archivist Meetings: 1 to 4 times per year   Marital Status: Married     Family History:  The patient's family history includes Cancer in his mother; Dementia in his mother; Diabetes in an other family member; Heart disease in an other family member; Hyperlipidemia in an other family member; Hypertension in his mother.  ROS:  Please see the history of present illness.  All other systems reviewed and are negative.  EKGs/Labs/Other Studies Reviewed:    The following studies were reviewed today: EKGs as detailed above.    fEKG:   The ekg ordered today demonstrates EKG demonstrates sinus rhythm with a right bundle branch block.  Recent  Labs: 08/01/2020: ALT 30; BUN 29; Creatinine, Ser 0.98; Hemoglobin 15.9; Platelets 208.0; Potassium 4.5; Sodium 138; TSH 3.24   Recent Lipid Panel    Component Value Date/Time   CHOL 188 08/01/2020 1222   TRIG 162.0 (H) 08/01/2020 1222   HDL 52.60 08/01/2020 1222   CHOLHDL 4 08/01/2020 1222   VLDL 32.4 08/01/2020 1222   LDLCALC 103 (H) 08/01/2020 1222   LDLDIRECT 97.0 07/17/2019 1430     Risk Assessment/Calculations:           Physical Exam:    VS:  BP 136/90   Pulse 70   Ht 6' 2.5" (1.892 m)   Wt 219  lb (99.3 kg)   SpO2 98%   BMI 27.74 kg/m     Wt Readings from Last 3 Encounters:  03/04/21 219 lb (99.3 kg)  03/03/21 219 lb (99.3 kg)  01/16/21 220 lb 3.2 oz (99.9 kg)     GEN:  Well nourished, well developed in no acute distress HEENT: Normal NECK: No JVD; No carotid bruits LYMPHATICS: No lymphadenopathy CARDIAC: RRR, no murmurs, rubs, gallops RESPIRATORY:  Clear to auscultation without rales, wheezing or rhonchi  ABDOMEN: Soft, non-tender, non-distended MUSCULOSKELETAL:  No edema; No deformity  SKIN: Warm and dry NEUROLOGIC:  Alert and oriented x 3 PSYCHIATRIC:  Normal affect   ASSESSMENT and PLAN   Right bundle branch block Clinically asymptomatic and continue to monitor.  This just represents senile degeneration of conduction system.  No further evaluation is necessary.  Chest pain of uncertain etiology We will obtain a CTA to evaluate further.  I will see him back in 1 year or earlier if needed.              Medication Adjustments/Labs and Tests Ordered: Current medicines are reviewed at length with the patient today.  Concerns regarding medicines are outlined above.  No orders of the defined types were placed in this encounter.  No orders of the defined types were placed in this encounter.   There are no Patient Instructions on file for this visit.   Signed, Early Osmond, MD  03/04/2021 3:02 PM    [facility]

## 2021-03-04 NOTE — Assessment & Plan Note (Signed)
Clinically asymptomatic and continue to monitor.  This just represents senile degeneration of conduction system.  No further evaluation is necessary.

## 2021-03-05 LAB — BASIC METABOLIC PANEL
BUN/Creatinine Ratio: 23 (ref 10–24)
BUN: 23 mg/dL (ref 8–27)
CO2: 22 mmol/L (ref 20–29)
Calcium: 9.7 mg/dL (ref 8.6–10.2)
Chloride: 104 mmol/L (ref 96–106)
Creatinine, Ser: 0.99 mg/dL (ref 0.76–1.27)
Glucose: 104 mg/dL — ABNORMAL HIGH (ref 70–99)
Potassium: 4.5 mmol/L (ref 3.5–5.2)
Sodium: 141 mmol/L (ref 134–144)
eGFR: 79 mL/min/{1.73_m2} (ref >59)

## 2021-03-14 ENCOUNTER — Telehealth (HOSPITAL_COMMUNITY): Payer: Self-pay | Admitting: *Deleted

## 2021-03-14 NOTE — Telephone Encounter (Signed)
Attempted to call patient regarding upcoming cardiac CT appointment. °Left message on voicemail with name and callback number ° °Kahliya Fraleigh RN Navigator Cardiac Imaging °[facility] °[phone] Office °[phone] Cell ° °

## 2021-03-14 NOTE — Telephone Encounter (Signed)
Patient returning call regarding upcoming cardiac imaging study; pt verbalizes understanding of appt date/time, parking situation and where to check in, pre-test NPO status and medications ordered, and verified current allergies; name and call back number provided for further questions should they arise  Gordy Clement RN Navigator Cardiac Imaging [facility] [phone] office [phone] cell  Patient is to take 100mg  metoprolol tartrate two hours prior to cardiac CT.  He will hold his daily atenolol and is aware to arrive at 10:30am for this 11am scan.

## 2021-03-17 ENCOUNTER — Other Ambulatory Visit: Payer: Self-pay

## 2021-03-17 ENCOUNTER — Ambulatory Visit (HOSPITAL_COMMUNITY)
Admission: RE | Admit: 2021-03-17 | Discharge: 2021-03-17 | Disposition: A | Discharge status: Home / Self Care | Payer: Medicare Other | Source: Ambulatory Visit | Attending: Internal Medicine | Admitting: Internal Medicine

## 2021-03-17 DIAGNOSIS — R079 Chest pain, unspecified: Secondary | ICD-10-CM | POA: Diagnosis not present

## 2021-03-17 DIAGNOSIS — I10 Essential (primary) hypertension: Secondary | ICD-10-CM | POA: Diagnosis not present

## 2021-03-17 DIAGNOSIS — R072 Precordial pain: Secondary | ICD-10-CM | POA: Diagnosis not present

## 2021-03-17 DIAGNOSIS — I4510 Unspecified right bundle-branch block: Secondary | ICD-10-CM | POA: Diagnosis not present

## 2021-03-17 DIAGNOSIS — I700 Atherosclerosis of aorta: Secondary | ICD-10-CM | POA: Diagnosis not present

## 2021-03-17 MED ORDER — NITROGLYCERIN 0.4 MG SL SUBL: 0.8000 mg | SUBLINGUAL_TABLET | Freq: Once | SUBLINGUAL | Status: AC

## 2021-03-17 MED ORDER — NITROGLYCERIN 0.4 MG SL SUBL
SUBLINGUAL_TABLET | SUBLINGUAL | Status: AC
  Administered 2021-03-17: 0.8 mg via SUBLINGUAL
  Filled 2021-03-17: qty 2

## 2021-03-17 MED ORDER — IOHEXOL 350 MG/ML SOLN
95.0000 mL | Freq: Once | INTRAVENOUS | Status: AC | PRN
  Administered 2021-03-17: 95 mL via INTRAVENOUS

## 2021-03-18 ENCOUNTER — Other Ambulatory Visit: Payer: Self-pay | Admitting: Internal Medicine

## 2021-03-18 ENCOUNTER — Telehealth: Payer: Self-pay | Admitting: Internal Medicine

## 2021-03-18 NOTE — Telephone Encounter (Signed)
Troy [name] is calling requesting his CT results.

## 2021-03-18 NOTE — Telephone Encounter (Signed)
Reviewed cardiac ct results w patient.

## 2021-04-09 DIAGNOSIS — D692 Other nonthrombocytopenic purpura: Secondary | ICD-10-CM | POA: Diagnosis not present

## 2021-04-09 DIAGNOSIS — L813 Cafe au lait spots: Secondary | ICD-10-CM | POA: Diagnosis not present

## 2021-04-09 DIAGNOSIS — L821 Other seborrheic keratosis: Secondary | ICD-10-CM | POA: Diagnosis not present

## 2021-04-09 DIAGNOSIS — D1801 Hemangioma of skin and subcutaneous tissue: Secondary | ICD-10-CM | POA: Diagnosis not present

## 2021-04-09 DIAGNOSIS — D2261 Melanocytic nevi of right upper limb, including shoulder: Secondary | ICD-10-CM | POA: Diagnosis not present

## 2021-04-09 DIAGNOSIS — D225 Melanocytic nevi of trunk: Secondary | ICD-10-CM | POA: Diagnosis not present

## 2021-04-09 DIAGNOSIS — L819 Disorder of pigmentation, unspecified: Secondary | ICD-10-CM | POA: Diagnosis not present

## 2021-04-09 DIAGNOSIS — Z85828 Personal history of other malignant neoplasm of skin: Secondary | ICD-10-CM | POA: Diagnosis not present

## 2021-04-21 DIAGNOSIS — M25512 Pain in left shoulder: Secondary | ICD-10-CM | POA: Diagnosis not present

## 2021-06-04 ENCOUNTER — Ambulatory Visit (INDEPENDENT_AMBULATORY_CARE_PROVIDER_SITE_OTHER): Payer: Medicare Other | Admitting: Gastroenterology

## 2021-06-04 ENCOUNTER — Encounter: Payer: Self-pay | Admitting: Gastroenterology

## 2021-06-04 ENCOUNTER — Other Ambulatory Visit: Payer: Self-pay | Admitting: Internal Medicine

## 2021-06-04 DIAGNOSIS — Z1211 Encounter for screening for malignant neoplasm of colon: Secondary | ICD-10-CM

## 2021-06-04 DIAGNOSIS — K219 Gastro-esophageal reflux disease without esophagitis: Secondary | ICD-10-CM | POA: Diagnosis not present

## 2021-06-04 NOTE — Telephone Encounter (Signed)
Please refill as per office routine med refill policy (all routine meds to be refilled for 3 mo or monthly (per pt preference) up to one year from last visit, then month to month grace period for 3 mo, then further med refills will have to be denied) ? ?

## 2021-06-04 NOTE — Progress Notes (Signed)
Review of pertinent gastrointestinal problems: 1.  Routine risk for colon cancer.  Colonoscopy 2008 was a poor prep.  Colonoscopy 2009 showed hemorrhoids but was otherwise normal.  Cologuard colon cancer screening test December 2019 was negative.  HPI: This is a very pleasant 76 year old man whom I last saw a little over 3 years ago  He was last here in our office a little over 3 years ago, December 2019.  At that time we discussed colon cancer screening options and he opted for Cologuard colon cancer screening.  It was negative.  He is here today to discuss colon cancer screening and also to discuss heartburn.  He has had no issues with his bowels.  No overt bleeding.  No significant abdominal pains.  He does have problems with intermittent "indigestion".  This is a burning in his chest and in his esophagus.  He was started on pantoprazole about 3 months ago and this has significantly helped his upper GI symptoms.  He was never having dysphagia, he has not lost weight.  Review of systems: Pertinent positive and negative review of systems were noted in the above HPI section. All other review negative.   Past Medical History:  Diagnosis Date   ALLERGIC RHINITIS 05/04/2007   BACK PAIN, LUMBAR 02/17/2010   CHEST PAIN 07/15/2009   CONSTIPATION 02/17/2010   Heart murmur    remote hx of murmur   HYPERLIPIDEMIA 05/04/2007   HYPERTENSION 05/04/2007   Interstitial cystitis 08/30/2013   Lumbar disc disease 10/01/2011   NECK PAIN, ACUTE 07/15/2009   Prostate enlargement    PSVT 05/04/2007   Skin cancer 08/30/2013   Basal cell x 2, dec 2014   Spermatocele    VERTIGO 05/04/2007    Past Surgical History:  Procedure Laterality Date   APPENDECTOMY     KNEE ARTHROSCOPY  30 yrs ago   s/p dental implant left upper jaw  11/09   also rt lower jaw   s/p RK surgery bilat     SPERMATOCELECTOMY  12/04/2011   Procedure: SPERMATOCELECTOMY;  Surgeon: Hanley Ben, MD;  Location: [facility];  Service: Urology;  Laterality: Left;  1 hour requested for this case    TONSILLECTOMY      Current Outpatient Medications  Medication Sig Dispense Refill   aspirin 81 MG EC tablet Take 81 mg by mouth daily.     atenolol (TENORMIN) 25 MG tablet Take 2 tablets (50 mg total) by mouth daily. 180 tablet 3   atorvastatin (LIPITOR) 40 MG tablet Take 1 tablet (40 mg total) by mouth daily. 90 tablet 2   bismuth subsalicylate (PEPTO BISMOL) 262 MG/15ML suspension Take 30 mLs by mouth every 4 (four) hours as needed for diarrhea or loose stools. 360 mL 0   famotidine-calcium carbonate-magnesium hydroxide (PEPCID COMPLETE) 10-800-165 MG chewable tablet Chew 1 tablet by mouth daily as needed.     fluticasone (FLONASE) 50 MCG/ACT nasal spray Place 2 sprays into both nostrils daily. 58 g 3   losartan (COZAAR) 100 MG tablet Take 1 tablet (100 mg total) by mouth daily. 90 tablet 3   metoprolol tartrate (LOPRESSOR) 100 MG tablet Take 1 tablet (100 mg total) by mouth once for 1 dose. Take 90-120 minutes prior to scan. 1 tablet 0   montelukast (SINGULAIR) 10 MG tablet Take 1 tablet (10 mg total) by mouth daily. 90 tablet 3   pantoprazole (PROTONIX) 40 MG tablet Take 1 tablet by mouth once daily 90 tablet 0   Probiotic Product (  PROBIOTIC PO) Take by mouth daily.     pseudoephedrine (SUDAFED) 30 MG tablet Take 30 mg by mouth every 4 (four) hours as needed for congestion.     traMADol (ULTRAM) 50 MG tablet TAKE 1 TABLET BY MOUTH EVERY 6 HOURS AS NEEDED 60 tablet 2   Vitamin D, Ergocalciferol, (DRISDOL) 1.25 MG (50000 UNIT) CAPS capsule Take 1 capsule (50,000 Units total) by mouth every 7 (seven) days. 12 capsule 0   No current facility-administered medications for this visit.    Allergies as of 06/04/2021 - Review Complete 06/04/2021  Allergen Reaction Noted   Celebrex [celecoxib] Anaphylaxis 12/01/2011   Cyclobenzaprine hcl  05/04/2007   Mobic [meloxicam] Other (See Comments) 12/04/2011    Family  History  Problem Relation Age of Onset   Cancer Mother        breast cancer   Dementia Mother    Hypertension Mother    Hyperlipidemia Other    Diabetes Other    Heart disease Other     Social History   Socioeconomic History   Marital status: Married    Spouse name: Not on file   Number of children: 1   Years of education: Not on file   Highest education level: Not on file  Occupational History   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  Tobacco Use   Smoking status: Never   Smokeless tobacco: Never  Scientific laboratory technician Use: Never used  Substance and Sexual Activity   Alcohol use: No    Alcohol/week: 0.0 standard drinks   Drug use: No   Sexual activity: Not Currently  Other Topics Concern   Not on file  Social History Narrative   Not on file   Social Determinants of Health   Financial Resource Strain: Low Risk    Difficulty of Paying Living Expenses: Not hard at all  Food Insecurity: No Food Insecurity   Worried About Charity fundraiser in the Last Year: Never true   [place] in the Last Year: Never true  Transportation Needs: No Transportation Needs   Lack of Transportation (Medical): No   Lack of Transportation (Non-Medical): No  Physical Activity: Sufficiently Active   Days of Exercise per Week: 5 days   Minutes of Exercise per Session: 30 min  Stress: No Stress Concern Present   Feeling of Stress : Not at all  Social Connections: Socially Integrated   Frequency of Communication with Friends and Family: More than three times a week   Frequency of Social Gatherings with Friends and Family: Twice a week   Attends Religious Services: 1 to 4 times per year   Active Member of [employer] or Organizations: Yes   Attends Archivist Meetings: 1 to 4 times per year   Marital Status: Married  Human resources officer Violence: Not At Risk   Fear of Current or Ex-Partner: No   Emotionally Abused: No   Physically Abused: No   Sexually  Abused: No     Physical Exam: BP (!) 160/82    Pulse 88    Ht 6\' 3"  (1.905 m)    Wt 219 lb (99.3 kg)    BMI 27.37 kg/m  Constitutional: generally well-appearing Psychiatric: alert and oriented x3 Eyes: extraocular movements intact Mouth: oral pharynx moist, no lesions Neck: supple no lymphadenopathy Cardiovascular: heart regular rate and rhythm Lungs: clear to auscultation bilaterally Abdomen: soft, nontender, nondistended, no obvious ascites, no peritoneal signs, normal bowel sounds Extremities: no  lower extremity edema bilaterally Skin: no lesions on visible extremities   Assessment and plan: 76 y.o. male with routine risk for colon cancer, GERD without alarm symptoms  We had a nice discussion again about colon cancer screening options and again he opted for Cologuard stool based screening test and we will arrange that for him.  He has mild GERD without alarm symptoms and he is quite happy with how he feels after being on proton pump inhibitor pantoprazole for 3 months.  He knows he can continue this safely, indefinitely.  I also explained that it is best to take 20 to 30 minutes prior to his first meal of the day.  Please see the "Patient Instructions" section for addition details about the plan.   Owens Loffler, MD [facility] 06/04/2021, 10:06 AM  Cc: Biagio Borg, MD  Total time on date of encounter was 45  minutes (this included time spent preparing to see the patient reviewing records; obtaining and/or reviewing separately obtained history; performing a medically appropriate exam and/or evaluation; counseling and educating the patient and family if present; ordering medications, tests or procedures if applicable; and documenting clinical information in the health record).

## 2021-06-04 NOTE — Patient Instructions (Addendum)
If you are age 77 or older, your body mass index should be between 23-30. Your Body mass index is 27.37 kg/m. If this is out of the aforementioned range listed, please consider follow up with your Primary Care Provider. ________________________________________________________  The [facility] providers would like to encourage you to use [facility] to communicate with providers for non-urgent requests or questions.  Due to long hold times on the telephone, sending your provider a message by [facility] may be a faster and more efficient way to get a response.  Please allow 48 business hours for a response.  Please remember that this is for non-urgent requests.  _______________________________________________________  CONTINUE: pantoprazole 40mg  one tablet shortly before breakfast meal each day.  Your provider has ordered Cologuard testing as an option for colon cancer screening. This is performed by [employer] and may be out of network with your insurance. PRIOR to completing the test, it is YOUR responsibility to contact your insurance about covered benefits for this test. Your out of pocket expense could be anywhere from $0.00 to $649.00.   When you call to check coverage with your insurer, please provide the following information:   -The ONLY provider of Cologuard is Max code for Cologuard is [phone].  [employer] NPI # [id number]  -[employer] Tax ID # [id number]   We have already sent your demographic and insurance information to [employer] [facility] (phone number [phone]) and they should contact you within the next week regarding your test. If you have not heard from them within the next week, please call our office at [phone].  Thank you for entrusting me with your care and choosing [facility].  Dr Ardis Hughs

## 2021-06-11 DIAGNOSIS — Z1211 Encounter for screening for malignant neoplasm of colon: Secondary | ICD-10-CM | POA: Diagnosis not present

## 2021-06-18 LAB — COLOGUARD: COLOGUARD: POSITIVE — AB

## 2021-06-20 ENCOUNTER — Encounter: Payer: Self-pay | Admitting: Internal Medicine

## 2021-06-20 ENCOUNTER — Other Ambulatory Visit: Payer: Self-pay | Admitting: Internal Medicine

## 2021-06-20 ENCOUNTER — Other Ambulatory Visit: Payer: Self-pay

## 2021-06-20 ENCOUNTER — Ambulatory Visit (INDEPENDENT_AMBULATORY_CARE_PROVIDER_SITE_OTHER): Payer: Medicare Other | Admitting: Internal Medicine

## 2021-06-20 VITALS — BP 162/100 | HR 85 | Temp 98.6°F | Ht 75.0 in | Wt 216.5 lb

## 2021-06-20 DIAGNOSIS — I10 Essential (primary) hypertension: Secondary | ICD-10-CM

## 2021-06-20 DIAGNOSIS — R079 Chest pain, unspecified: Secondary | ICD-10-CM | POA: Diagnosis not present

## 2021-06-20 DIAGNOSIS — E7800 Pure hypercholesterolemia, unspecified: Secondary | ICD-10-CM

## 2021-06-20 DIAGNOSIS — R739 Hyperglycemia, unspecified: Secondary | ICD-10-CM

## 2021-06-20 NOTE — Telephone Encounter (Signed)
Please refill as per office routine med refill policy (all routine meds to be refilled for 3 mo or monthly (per pt preference) up to one year from last visit, then month to month grace period for 3 mo, then further med refills will have to be denied) ? ?

## 2021-06-20 NOTE — Assessment & Plan Note (Signed)
BP Readings from Last 3 Encounters:  06/20/21 (!) 162/100  06/04/21 (!) 160/82  03/17/21 130/88   ucontrolled, pt to continue medical treatment tenormiin, losartan as decliens change, pt to f/u bp at home and next visit

## 2021-06-20 NOTE — Assessment & Plan Note (Signed)
Lab Results  Component Value Date   HGBA1C 5.9 08/01/2020   Stable, pt to continue current medical treatment  - diet

## 2021-06-20 NOTE — Assessment & Plan Note (Signed)
Lab Results  Component Value Date   LDLCALC 103 (H) 08/01/2020   Uncontrolled, goal ldl < 70, pt to continue current statin liptor 40 as declines change for now, for low chol diet, f/u next visit

## 2021-06-20 NOTE — Progress Notes (Signed)
Patient ID: Troy [name], male   DOB: [birth year], 76 y.o.   MRN: [id number]         Chief Complaint::  Chest Pain (Rib area )        HPI:  Troy [name] is a 76 y.o. male here with c/o sore tender area to left parasternal about t4 inbetween the ribs, and ongoing since oct 2022 when he recalls an episode of heavy exertion at the gym doing certain arm excercises; just has never resolved, current mild intermittent, sharp and dull , no radiation or associated with sob, n/v, palp or dizziness.  Has seen GI, cardiology and orthopedic without relief.  Eval includes CTA coronary - has non obstructive CAD.   Denies worsening reflux, abd pain, dysphagia, n/v, bowel change or blood.   Pt denies polydipsia, polyuria, or new focal neuro s/s.   Pt denies fever, wt loss, night sweats, loss of appetite, or other constitutional symptoms  No cough or wheezing.     Wt Readings from Last 3 Encounters:  06/20/21 216 lb 8 oz (98.2 kg)  06/04/21 219 lb (99.3 kg)  03/04/21 219 lb (99.3 kg)   BP Readings from Last 3 Encounters:  06/20/21 (!) 162/100  06/04/21 (!) 160/82  03/17/21 130/88   Immunization History  Administered Date(s) Administered   H1N1 04/10/2008   Influenza Whole 05/04/2007, 03/08/2009, 02/17/2010, 01/07/2011   Influenza, High Dose Seasonal PF 01/29/2014, 02/22/2018   PFIZER(Purple Top)SARS-COV-2 Vaccination 06/09/2019, 07/04/2019, 01/31/2020, 02/05/2020, 01/14/2021   Pneumococcal Conjugate-13 08/30/2013   Pneumococcal Polysaccharide-23 11/25/2006, 08/29/2015   Td 04/10/2008   Zoster, Live 04/10/2008   There are no preventive care reminders to display for this patient.     Past Medical History:  Diagnosis Date   ALLERGIC RHINITIS 05/04/2007   BACK PAIN, LUMBAR 02/17/2010   CHEST PAIN 07/15/2009   CONSTIPATION 02/17/2010   Heart murmur    remote hx of murmur   HYPERLIPIDEMIA 05/04/2007   HYPERTENSION 05/04/2007   Interstitial cystitis 08/30/2013   Lumbar disc disease 10/01/2011    NECK PAIN, ACUTE 07/15/2009   Prostate enlargement    PSVT 05/04/2007   Skin cancer 08/30/2013   Basal cell x 2, dec 2014   Spermatocele    VERTIGO 05/04/2007   Past Surgical History:  Procedure Laterality Date   APPENDECTOMY     KNEE ARTHROSCOPY  30 yrs ago   s/p dental implant left upper jaw  11/09   also rt lower jaw   s/p RK surgery bilat     SPERMATOCELECTOMY  12/04/2011   Procedure: SPERMATOCELECTOMY;  Surgeon: Hanley Ben, MD;  Location: [facility];  Service: Urology;  Laterality: Left;  1 hour requested for this case    TONSILLECTOMY      reports that he has never smoked. He has never used smokeless tobacco. He reports that he does not drink alcohol and does not use drugs. family history includes Cancer in his mother; Dementia in his mother; Diabetes in an other family member; Heart disease in an other family member; Hyperlipidemia in an other family member; Hypertension in his mother. Allergies  Allergen Reactions   Celebrex [Celecoxib] Anaphylaxis    Oral sores   Cyclobenzaprine Hcl     REACTION: faical skin rash, 'hard cysts'   Mobic [Meloxicam] Other (See Comments)    Sores in mouth   Current Outpatient Medications on File Prior to Visit  Medication Sig Dispense Refill   aspirin 81 MG EC tablet Take 81 mg by  mouth daily.     atenolol (TENORMIN) 25 MG tablet Take 2 tablets (50 mg total) by mouth daily. 180 tablet 3   atorvastatin (LIPITOR) 40 MG tablet Take 1 tablet (40 mg total) by mouth daily. 90 tablet 2   famotidine-calcium carbonate-magnesium hydroxide (PEPCID COMPLETE) 10-800-165 MG chewable tablet Chew 1 tablet by mouth daily as needed.     pantoprazole (PROTONIX) 40 MG tablet Take 1 tablet by mouth once daily 90 tablet 0   Probiotic Product (PROBIOTIC PO) Take by mouth daily.     pseudoephedrine (SUDAFED) 30 MG tablet Take 30 mg by mouth every 4 (four) hours as needed for congestion.     traMADol (ULTRAM) 50 MG tablet TAKE 1 TABLET BY MOUTH  EVERY 6 HOURS AS NEEDED 60 tablet 2   No current facility-administered medications on file prior to visit.        ROS:  All others reviewed and negative.  Objective        PE:  BP (!) 162/100    Pulse 85    Temp 98.6 F (37 C) (Oral)    Ht 6\' 3"  (1.905 m)    Wt 216 lb 8 oz (98.2 kg)    SpO2 99%    BMI 27.06 kg/m                 Constitutional: Pt appears in NAD               HENT: Head: NCAT.                Right Ear: External ear normal.                 Left Ear: External ear normal.                Eyes: . Pupils are equal, round, and reactive to light. Conjunctivae and EOM are normal               Nose: without d/c or deformity               Neck: Neck supple. Gross normal ROM               Cardiovascular: Normal rate and regular rhythm.                 Pulmonary/Chest: Effort normal and breath sounds without rales or wheezing.                Abd:  Soft, NT, ND, + BS, no organomegaly               Has tender intercostal area left parasternal t4 without rash or swelling               Neurological: Pt is alert. At baseline orientation, motor grossly intact               Skin: Skin is warm. No rashes, no other new lesions, LE edema - none               Psychiatric: Pt behavior is normal without agitation   Micro: none  Cardiac tracings I have personally interpreted today:  ecg - SR wit RBBB  Pertinent Radiological findings (summarize): none   Lab Results  Component Value Date   WBC 6.7 08/01/2020   HGB 15.9 08/01/2020   HCT 46.5 08/01/2020   PLT 208.0 08/01/2020   GLUCOSE 104 (H) 03/04/2021   CHOL 188 08/01/2020   TRIG 162.0 (  H) 08/01/2020   HDL 52.60 08/01/2020   LDLDIRECT 97.0 07/17/2019   LDLCALC 103 (H) 08/01/2020   ALT 30 08/01/2020   AST 23 08/01/2020   NA 141 03/04/2021   K 4.5 03/04/2021   CL 104 03/04/2021   CREATININE 0.99 03/04/2021   BUN 23 03/04/2021   CO2 22 03/04/2021   TSH 3.24 08/01/2020   PSA 3.21 08/01/2020   HGBA1C 5.9 08/01/2020    Assessment/Plan:  Troy [name] is a 76 y.o. White or Caucasian [1] male with  has a past medical history of ALLERGIC RHINITIS (05/04/2007), BACK PAIN, LUMBAR (02/17/2010), CHEST PAIN (07/15/2009), CONSTIPATION (02/17/2010), Heart murmur, HYPERLIPIDEMIA (05/04/2007), HYPERTENSION (05/04/2007), Interstitial cystitis (08/30/2013), Lumbar disc disease (10/01/2011), NECK PAIN, ACUTE (07/15/2009), Prostate enlargement, PSVT (05/04/2007), Skin cancer (08/30/2013), Spermatocele, and VERTIGO (05/04/2007).  Chest pain of uncertain etiology ECG reivewed, pain c/w msk - no further evaluation needed, I suggested f/u with Dr Janelle Floor management for nerve ablation  Hyperglycemia Lab Results  Component Value Date   HGBA1C 5.9 08/01/2020   Stable, pt to continue current medical treatment  - diet   Essential hypertension BP Readings from Last 3 Encounters:  06/20/21 (!) 162/100  06/04/21 (!) 160/82  03/17/21 130/88   ucontrolled, pt to continue medical treatment tenormiin, losartan as decliens change, pt to f/u bp at home and next visit   Hyperlipidemia Lab Results  Component Value Date   Buna 103 (H) 08/01/2020   Uncontrolled, goal ldl < 70, pt to continue current statin liptor 40 as declines change for now, for low chol diet, f/u next visit  Followup: Return if symptoms worsen or fail to improve.  Cathlean Cower, MD 06/20/2021 8:20 PM [facility]

## 2021-06-20 NOTE — Assessment & Plan Note (Signed)
ECG reivewed, pain c/w msk - no further evaluation needed, I suggested f/u with Dr Janelle Floor management for nerve ablation

## 2021-06-20 NOTE — Patient Instructions (Signed)
Ok to consider seeing Dr Nelva Bush for possible nerve ablation if the pain persists  Please continue all other medications as before, and refills have been done if requested.  Please have the pharmacy call with any other refills you may need.  Please continue your efforts at being more active, low cholesterol diet, and weight control.  Please keep your appointments with your specialists as you may have planned  We'll see you back in April 2023 as planned

## 2021-06-24 ENCOUNTER — Other Ambulatory Visit: Payer: Self-pay

## 2021-06-24 DIAGNOSIS — Z1211 Encounter for screening for malignant neoplasm of colon: Secondary | ICD-10-CM

## 2021-06-24 DIAGNOSIS — R195 Other fecal abnormalities: Secondary | ICD-10-CM

## 2021-07-10 ENCOUNTER — Other Ambulatory Visit: Payer: Self-pay | Admitting: Internal Medicine

## 2021-07-10 NOTE — Telephone Encounter (Signed)
Please refill as per office routine med refill policy (all routine meds to be refilled for 3 mo or monthly (per pt preference) up to one year from last visit, then month to month grace period for 3 mo, then further med refills will have to be denied) ? ?

## 2021-07-18 ENCOUNTER — Encounter: Payer: Self-pay | Admitting: Gastroenterology

## 2021-07-18 ENCOUNTER — Other Ambulatory Visit: Payer: Self-pay | Admitting: Gastroenterology

## 2021-07-18 ENCOUNTER — Ambulatory Visit (AMBULATORY_SURGERY_CENTER): Payer: Medicare Other | Admitting: Gastroenterology

## 2021-07-18 VITALS — BP 122/79 | HR 72 | Temp 98.6°F | Resp 16 | Ht 75.0 in | Wt 219.0 lb

## 2021-07-18 DIAGNOSIS — K631 Perforation of intestine (nontraumatic): Secondary | ICD-10-CM | POA: Diagnosis not present

## 2021-07-18 DIAGNOSIS — Z1211 Encounter for screening for malignant neoplasm of colon: Secondary | ICD-10-CM

## 2021-07-18 DIAGNOSIS — E669 Obesity, unspecified: Secondary | ICD-10-CM | POA: Diagnosis not present

## 2021-07-18 DIAGNOSIS — I10 Essential (primary) hypertension: Secondary | ICD-10-CM | POA: Diagnosis not present

## 2021-07-18 DIAGNOSIS — D128 Benign neoplasm of rectum: Secondary | ICD-10-CM

## 2021-07-18 DIAGNOSIS — R195 Other fecal abnormalities: Secondary | ICD-10-CM

## 2021-07-18 MED ORDER — SODIUM CHLORIDE 0.9 % IV SOLN: 500.0000 mL | Freq: Once | INTRAVENOUS | Status: DC

## 2021-07-18 NOTE — Progress Notes (Signed)
Pt's states no medical or surgical changes since previsit or office visit. 

## 2021-07-18 NOTE — Op Note (Signed)
[facility] ?Patient Name: Troy [name] ?Procedure Date: 07/18/2021 1:28 PM ?MRN: [id number] ?Endoscopist: Milus Banister , MD ?Age: 76 ?Referring MD:  ?Date of Birth: 1945/07/05 ?Gender: Male ?Account #: [id number] ?Procedure:                Colonoscopy ?Indications:              Positive Cologuard test ?Medicines:                Monitored Anesthesia Care ?Procedure:                Pre-Anesthesia Assessment: ?                          - Prior to the procedure, a History and Physical  ?                          was performed, and patient medications and  ?                          allergies were reviewed. The patient's tolerance of  ?                          previous anesthesia was also reviewed. The risks  ?                          and benefits of the procedure and the sedation  ?                          options and risks were discussed with the patient.  ?                          All questions were answered, and informed consent  ?                          was obtained. Prior Anticoagulants: The patient has  ?                          taken no previous anticoagulant or antiplatelet  ?                          agents. ASA Grade Assessment: II - A patient with  ?                          mild systemic disease. After reviewing the risks  ?                          and benefits, the patient was deemed in  ?                          satisfactory condition to undergo the procedure. ?                          After obtaining informed consent, the colonoscope  ?  was passed under direct vision. Throughout the  ?                          procedure, the patient's blood pressure, pulse, and  ?                          oxygen saturations were monitored continuously. The  ?                          Colonoscope was introduced through the anus and  ?                          advanced to the the cecum, identified by  ?                          appendiceal orifice and ileocecal valve. The  ?                           colonoscopy was performed without difficulty. The  ?                          patient tolerated the procedure well. The quality  ?                          of the bowel preparation was good. The ileocecal  ?                          valve, appendiceal orifice, and rectum were  ?                          photographed. ?Scope In: 1:30:23 PM ?Scope Out: 1:42:49 PM ?Scope Withdrawal Time: 0 hours 7 minutes 48 seconds  ?Total Procedure Duration: 0 hours 12 minutes 26 seconds  ?Findings:                 A 2 mm polyp was found in the rectum. The polyp was  ?                          sessile. The polyp was removed with a cold snare.  ?                          Resection and retrieval were complete. ?                          The exam was otherwise without abnormality on  ?                          direct and retroflexion views. ?Complications:            No immediate complications. Estimated blood loss:  ?                          None. ?Estimated Blood Loss:     Estimated blood loss: none. ?Impression:               -  One 2 mm polyp in the rectum, removed with a cold  ?                          snare. Resected and retrieved. ?                          - The examination was otherwise normal on direct  ?                          and retroflexion views. ?Recommendation:           - Patient has a contact number available for  ?                          emergencies. The signs and symptoms of potential  ?                          delayed complications were discussed with the  ?                          patient. Return to normal activities tomorrow.  ?                          Written discharge instructions were provided to the  ?                          patient. ?                          - Resume previous diet. ?                          - Continue present medications. ?                          - Await pathology results. ?Milus Banister, MD ?07/18/2021 1:45:07 PM ?This report has been signed  electronically. ?

## 2021-07-18 NOTE — Patient Instructions (Signed)
Handout on polyps given to patient. Await pathology results. ?Resume previous diet and continue present medications. ? ? ?YOU HAD AN ENDOSCOPIC PROCEDURE TODAY AT [facility]:   Refer to the procedure report that was given to you for any specific questions about what was found during the examination.  If the procedure report does not answer your questions, please call your gastroenterologist to clarify.  If you requested that your care partner not be given the details of your procedure findings, then the procedure report has been included in a sealed envelope for you to review at your convenience later. ? ?YOU SHOULD EXPECT: Some feelings of bloating in the abdomen. Passage of more gas than usual.  Walking can help get rid of the air that was put into your GI tract during the procedure and reduce the bloating. If you had a lower endoscopy (such as a colonoscopy or flexible sigmoidoscopy) you may notice spotting of blood in your stool or on the toilet paper. If you underwent a bowel prep for your procedure, you may not have a normal bowel movement for a few days. ? ?Please Note:  You might notice some irritation and congestion in your nose or some drainage.  This is from the oxygen used during your procedure.  There is no need for concern and it should clear up in a day or so. ? ?SYMPTOMS TO REPORT IMMEDIATELY: ? ?Following lower endoscopy (colonoscopy or flexible sigmoidoscopy): ? Excessive amounts of blood in the stool ? Significant tenderness or worsening of abdominal pains ? Swelling of the abdomen that is new, acute ? Fever of 100?F or higher ? ?For urgent or emergent issues, a gastroenterologist can be reached at any hour by calling [phone]. ?Do not use MyChart messaging for urgent concerns.  ? ? ?DIET:  We do recommend a small meal at first, but then you may proceed to your regular diet.  Drink plenty of fluids but you should avoid alcoholic beverages for 24 hours. ? ?ACTIVITY:  You  should plan to take it easy for the rest of today and you should NOT DRIVE or use heavy machinery until tomorrow (because of the sedation medicines used during the test).   ? ?FOLLOW UP: ?Our staff will call the number listed on your records 48-72 hours following your procedure to check on you and address any questions or concerns that you may have regarding the information given to you following your procedure. If we do not reach you, we will leave a message.  We will attempt to reach you two times.  During this call, we will ask if you have developed any symptoms of COVID 19. If you develop any symptoms (ie: fever, flu-like symptoms, shortness of breath, cough etc.) before then, please call [phone].  If you test positive for Covid 19 in the 2 weeks post procedure, please call and report this information to [place].   ? ?If any biopsies were taken you will be contacted by phone or by letter within the next 1-3 weeks.  Please call us at [phone] if you have not heard about the biopsies in 3 weeks.  ? ? ?SIGNATURES/CONFIDENTIALITY: ?You and/or your care partner have signed paperwork which will be entered into your electronic medical record.  These signatures attest to the fact that that the information above on your After Visit Summary has been reviewed and is understood.  Full responsibility of the confidentiality of this discharge information lies with you and/or your care-partner.  ?

## 2021-07-18 NOTE — Progress Notes (Signed)
HPI: ?This is a man with positive Cologuard colon cancer screening test ? ? ?ROS: complete GI ROS as described in HPI, all other review negative. ? ?Constitutional:  No unintentional weight loss ? ? ?Past Medical History:  ?Diagnosis Date  ? ALLERGIC RHINITIS 05/04/2007  ? BACK PAIN, LUMBAR 02/17/2010  ? CHEST PAIN 07/15/2009  ? CONSTIPATION 02/17/2010  ? Heart murmur   ? remote hx of murmur  ? HYPERLIPIDEMIA 05/04/2007  ? HYPERTENSION 05/04/2007  ? Interstitial cystitis 08/30/2013  ? Lumbar disc disease 10/01/2011  ? NECK PAIN, ACUTE 07/15/2009  ? Prostate enlargement   ? PSVT 05/04/2007  ? Skin cancer 08/30/2013  ? Basal cell x 2, dec 2014  ? Spermatocele   ? VERTIGO 05/04/2007  ? ? ?Past Surgical History:  ?Procedure Laterality Date  ? APPENDECTOMY    ? KNEE ARTHROSCOPY  30 yrs ago  ? s/p dental implant left upper jaw  11/09  ? also rt lower jaw  ? s/p RK surgery bilat    ? SPERMATOCELECTOMY  12/04/2011  ? Procedure: SPERMATOCELECTOMY;  Surgeon: Hanley Ben, MD;  Location: [facility];  Service: Urology;  Laterality: Left;  1 hour requested for this case ?  ? TONSILLECTOMY    ? ? ?Current Outpatient Medications  ?Medication Sig Dispense Refill  ? atorvastatin (LIPITOR) 40 MG tablet Take 1 tablet by mouth once daily 90 tablet 0  ? aspirin 81 MG EC tablet Take 81 mg by mouth daily.    ? atenolol (TENORMIN) 25 MG tablet Take 2 tablets (50 mg total) by mouth daily. 180 tablet 3  ? famotidine-calcium carbonate-magnesium hydroxide (PEPCID COMPLETE) 10-800-165 MG chewable tablet Chew 1 tablet by mouth daily as needed.    ? losartan (COZAAR) 100 MG tablet Take 1 tablet by mouth once daily 90 tablet 0  ? pantoprazole (PROTONIX) 40 MG tablet Take 1 tablet by mouth once daily 90 tablet 0  ? Probiotic Product (PROBIOTIC PO) Take by mouth daily.    ? pseudoephedrine (SUDAFED) 30 MG tablet Take 30 mg by mouth every 4 (four) hours as needed for congestion.    ? traMADol (ULTRAM) 50 MG tablet TAKE 1 TABLET BY MOUTH  EVERY 6 HOURS AS NEEDED 60 tablet 2  ? ?Current Facility-Administered Medications  ?Medication Dose Route Frequency Provider Last Rate Last Admin  ? 0.9 %  sodium chloride infusion  500 mL Intravenous Once Milus Banister, MD      ? ? ?Allergies as of 07/18/2021 - Review Complete 06/20/2021  ?Allergen Reaction Noted  ? Celebrex [celecoxib] Anaphylaxis 12/01/2011  ? Cyclobenzaprine hcl  05/04/2007  ? Mobic [meloxicam] Other (See Comments) 12/04/2011  ? ? ?Family History  ?Problem Relation Age of Onset  ? Cancer Mother   ?     breast cancer  ? Dementia Mother   ? Hypertension Mother   ? Hyperlipidemia Other   ? Diabetes Other   ? Heart disease Other   ? ? ?Social History  ? ?Socioeconomic History  ? Marital status: Married  ?  Spouse name: Not on file  ? Number of children: 1  ? Years of education: Not on file  ? Highest education level: Not on file  ?Occupational History  ? Occupation: retired 72 yrs [employer]	[place]  ?Tobacco Use  ? Smoking status: Never  ? Smokeless tobacco: Never  ?Vaping Use  ? Vaping Use: Never used  ?Substance and Sexual Activity  ? Alcohol use: No  ?  Alcohol/week:  0.0 standard drinks  ? Drug use: No  ? Sexual activity: Not Currently  ?Other Topics Concern  ? Not on file  ?Social History Narrative  ? Not on file  ? ?Social Determinants of Health  ? ?Financial Resource Strain: Low Risk   ? Difficulty of Paying Living Expenses: Not hard at all  ?Food Insecurity: No Food Insecurity  ? Worried About Charity fundraiser in the Last Year: Never true  ? Ran Out of Food in the Last Year: Never true  ?Transportation Needs: No Transportation Needs  ? Lack of Transportation (Medical): No  ? Lack of Transportation (Non-Medical): No  ?Physical Activity: Sufficiently Active  ? Days of Exercise per Week: 5 days  ? Minutes of Exercise per Session: 30 min  ?Stress: No Stress Concern Present  ? Feeling of Stress : Not at all  ?Social Connections: Socially Integrated  ? Frequency of  Communication with Friends and Family: More than three times a week  ? Frequency of Social Gatherings with Friends and Family: Twice a week  ? Attends Religious Services: 1 to 4 times per year  ? Active Member of Clubs or Organizations: Yes  ? Attends Archivist Meetings: 1 to 4 times per year  ? Marital Status: Married  ?Intimate Partner Violence: Not At Risk  ? Fear of Current or Ex-Partner: No  ? Emotionally Abused: No  ? Physically Abused: No  ? Sexually Abused: No  ? ? ? ?Physical Exam: ?BP (!) 157/96   Pulse 80   Temp 98.6 ?F (37 ?C) (Temporal)   Ht '6\' 3"'$  (1.905 m)   Wt 219 lb (99.3 kg)   SpO2 97%   BMI 27.37 kg/m?  ?Constitutional: generally well-appearing ?Psychiatric: alert and oriented x3 ?Lungs: CTA bilaterally ?Heart: no MCR ? ?Assessment and plan: ?76 y.o. male with positive Cologuard colon cancer screening test ? ?Diagnostic colonoscopy today ? ?Care is appropriate for the ambulatory setting. ? ?Owens Loffler, MD ?[facility] ?07/18/2021, 12:48 PM ? ? ? ?

## 2021-07-18 NOTE — Progress Notes (Signed)
Report to PACU, RN, vss, BBS= Clear.  

## 2021-07-18 NOTE — Progress Notes (Signed)
Called to room to assist during endoscopic procedure.  Patient ID and intended procedure confirmed with present staff. Received instructions for my participation in the procedure from the performing physician.  

## 2021-07-22 ENCOUNTER — Telehealth: Payer: Self-pay

## 2021-07-22 NOTE — Telephone Encounter (Signed)
No answer, left message to call back later today, B.Emerick Weatherly RN. 

## 2021-07-22 NOTE — Telephone Encounter (Signed)
?  Follow up Call- ? ?Call back number 07/18/2021  ?Post procedure Call Back phone  # [phone]  ?Permission to leave phone message Yes  ?Some recent data might be hidden  ?  ? ?Patient questions: ? ?Do you have a fever, pain , or abdominal swelling? No. ?Pain Score  0 * ? ?Have you tolerated food without any problems? Yes.   ? ?Have you been able to return to your normal activities? Yes.   ? ?Do you have any questions about your discharge instructions: ?Diet   No. ?Medications  No. ?Follow up visit  No. ? ?Do you have questions or concerns about your Care? No. ? ?Actions: ?* If pain score is 4 or above: ?No action needed, pain <4. ? ? ?

## 2021-08-04 ENCOUNTER — Encounter: Payer: Self-pay | Admitting: Gastroenterology

## 2021-08-05 ENCOUNTER — Ambulatory Visit: Payer: Medicare Other | Admitting: Internal Medicine

## 2021-08-12 ENCOUNTER — Other Ambulatory Visit: Payer: Self-pay | Admitting: Internal Medicine

## 2021-08-12 NOTE — Telephone Encounter (Signed)
Please refill as per office routine med refill policy (all routine meds to be refilled for 3 mo or monthly (per pt preference) up to one year from last visit, then month to month grace period for 3 mo, then further med refills will have to be denied) ? ?

## 2021-08-25 ENCOUNTER — Ambulatory Visit (INDEPENDENT_AMBULATORY_CARE_PROVIDER_SITE_OTHER): Payer: Medicare Other | Admitting: Internal Medicine

## 2021-08-25 VITALS — BP 138/86 | HR 60 | Temp 98.1°F | Ht 75.0 in | Wt 217.0 lb

## 2021-08-25 DIAGNOSIS — E559 Vitamin D deficiency, unspecified: Secondary | ICD-10-CM | POA: Diagnosis not present

## 2021-08-25 DIAGNOSIS — N320 Bladder-neck obstruction: Secondary | ICD-10-CM | POA: Diagnosis not present

## 2021-08-25 DIAGNOSIS — E7800 Pure hypercholesterolemia, unspecified: Secondary | ICD-10-CM | POA: Diagnosis not present

## 2021-08-25 DIAGNOSIS — E538 Deficiency of other specified B group vitamins: Secondary | ICD-10-CM

## 2021-08-25 DIAGNOSIS — I10 Essential (primary) hypertension: Secondary | ICD-10-CM | POA: Diagnosis not present

## 2021-08-25 DIAGNOSIS — R739 Hyperglycemia, unspecified: Secondary | ICD-10-CM

## 2021-08-25 LAB — TSH: TSH: 3.99 u[IU]/mL (ref 0.35–5.50)

## 2021-08-25 LAB — CBC WITH DIFFERENTIAL/PLATELET
Basophils Absolute: 0.1 10*3/uL (ref 0.0–0.1)
Basophils Relative: 0.9 % (ref 0.0–3.0)
Eosinophils Absolute: 0.5 10*3/uL (ref 0.0–0.7)
Eosinophils Relative: 6.5 % — ABNORMAL HIGH (ref 0.0–5.0)
HCT: 44.7 % (ref 39.0–52.0)
Hemoglobin: 15.2 g/dL (ref 13.0–17.0)
Lymphocytes Relative: 22.9 % (ref 12.0–46.0)
Lymphs Abs: 1.7 10*3/uL (ref 0.7–4.0)
MCHC: 34.1 g/dL (ref 30.0–36.0)
MCV: 92.3 fl (ref 78.0–100.0)
Monocytes Absolute: 0.6 10*3/uL (ref 0.1–1.0)
Monocytes Relative: 8.5 % (ref 3.0–12.0)
Neutro Abs: 4.5 10*3/uL (ref 1.4–7.7)
Neutrophils Relative %: 61.2 % (ref 43.0–77.0)
Platelets: 213 10*3/uL (ref 150.0–400.0)
RBC: 4.84 Mil/uL (ref 4.22–5.81)
RDW: 12.8 % (ref 11.5–15.5)
WBC: 7.4 10*3/uL (ref 4.0–10.5)

## 2021-08-25 LAB — VITAMIN B12: Vitamin B-12: 531 pg/mL (ref 211–911)

## 2021-08-25 LAB — PSA: PSA: 2.71 ng/mL (ref 0.10–4.00)

## 2021-08-25 MED ORDER — ATORVASTATIN CALCIUM 40 MG PO TABS: 40.0000 mg | ORAL_TABLET | Freq: Every day | ORAL | 3 refills | Status: DC

## 2021-08-25 MED ORDER — ATENOLOL 25 MG PO TABS: 50.0000 mg | ORAL_TABLET | Freq: Every day | ORAL | 3 refills | Status: DC

## 2021-08-25 MED ORDER — PANTOPRAZOLE SODIUM 40 MG PO TBEC
40.0000 mg | DELAYED_RELEASE_TABLET | Freq: Every day | ORAL | 3 refills | Status: DC
Start: 2021-08-25 — End: 2022-05-19

## 2021-08-25 MED ORDER — TRAMADOL HCL 50 MG PO TABS: 50.0000 mg | ORAL_TABLET | Freq: Four times a day (QID) | ORAL | 2 refills | Status: DC | PRN

## 2021-08-25 MED ORDER — LOSARTAN POTASSIUM 100 MG PO TABS: 100.0000 mg | ORAL_TABLET | Freq: Every day | ORAL | 3 refills | Status: DC

## 2021-08-25 NOTE — Assessment & Plan Note (Signed)
Last vitamin D ?Lab Results  ?Component Value Date  ? VD25OH 17.51 (L) 08/01/2020  ? ?Low, to start oral replacement ? ?

## 2021-08-25 NOTE — Progress Notes (Signed)
Patient ID: [name], male   DOB: Oct 01, 1945, 76 y.o.   MRN: [id number] ? ? ? ?     Chief Complaint:: yearly exam ? ?     HPI:  Troy [name] is a 76 y.o. male overall doing ok.  Pt denies chest pain, increased sob or doe, wheezing, orthopnea, PND, increased LE swelling, palpitations, dizziness or syncope.   Pt denies polydipsia, polyuria, or new focal neuro s/s.    Pt denies fever, wt loss, night sweats, loss of appetite, or other constitutional symptoms  Taking vit d 2000 u qd sometimes.  No other new complaints  Denies urinary symptoms such as dysuria, frequency, urgency, flank pain, hematuria or n/v, fever, chills.  ?  ?Wt Readings from Last 3 Encounters:  ?08/25/21 217 lb (98.4 kg)  ?07/18/21 219 lb (99.3 kg)  ?06/20/21 216 lb 8 oz (98.2 kg)  ? ?BP Readings from Last 3 Encounters:  ?08/25/21 138/86  ?07/18/21 122/79  ?06/20/21 (!) 162/100  ? ?Immunization History  ?Administered Date(s) Administered  ? H1N1 04/10/2008  ? Influenza Whole 05/04/2007, 03/08/2009, 02/17/2010, 01/07/2011  ? Influenza, High Dose Seasonal PF 01/29/2014, 02/22/2018  ? PFIZER(Purple Top)SARS-COV-2 Vaccination 06/09/2019, 07/04/2019, 01/31/2020, 02/05/2020, 01/14/2021  ? Pneumococcal Conjugate-13 08/30/2013  ? Pneumococcal Polysaccharide-23 11/25/2006, 08/29/2015  ? Td 04/10/2008  ? Zoster, Live 04/10/2008  ? ?There are no preventive care reminders to display for this patient. ? ?  ? ?Past Medical History:  ?Diagnosis Date  ? ALLERGIC RHINITIS 05/04/2007  ? BACK PAIN, LUMBAR 02/17/2010  ? CHEST PAIN 07/15/2009  ? CONSTIPATION 02/17/2010  ? Heart murmur   ? remote hx of murmur  ? HYPERLIPIDEMIA 05/04/2007  ? HYPERTENSION 05/04/2007  ? Interstitial cystitis 08/30/2013  ? Lumbar disc disease 10/01/2011  ? NECK PAIN, ACUTE 07/15/2009  ? Prostate enlargement   ? PSVT 05/04/2007  ? Skin cancer 08/30/2013  ? Basal cell x 2, dec 2014  ? Spermatocele   ? VERTIGO 05/04/2007  ? ?Past Surgical History:  ?Procedure Laterality Date  ? APPENDECTOMY     ? KNEE ARTHROSCOPY  30 yrs ago  ? s/p dental implant left upper jaw  11/09  ? also rt lower jaw  ? s/p RK surgery bilat    ? SPERMATOCELECTOMY  12/04/2011  ? Procedure: SPERMATOCELECTOMY;  Surgeon: Hanley Ben, MD;  Location: [facility];  Service: Urology;  Laterality: Left;  1 hour requested for this case ?  ? TONSILLECTOMY    ? ? reports that he has never smoked. He has never used smokeless tobacco. He reports that he does not drink alcohol and does not use drugs. ?family history includes Cancer in his mother; Dementia in his mother; Diabetes in an other family member; Heart disease in an other family member; Hyperlipidemia in an other family member; Hypertension in his mother. ?Allergies  ?Allergen Reactions  ? Celebrex [Celecoxib] Anaphylaxis  ?  Oral sores  ? Cyclobenzaprine Hcl   ?  REACTION: faical skin rash, 'hard cysts'  ? Mobic [Meloxicam] Other (See Comments)  ?  Sores in mouth  ? ?Current Outpatient Medications on File Prior to Visit  ?Medication Sig Dispense Refill  ? aspirin 81 MG EC tablet Take 81 mg by mouth daily.    ? famotidine-calcium carbonate-magnesium hydroxide (PEPCID COMPLETE) 10-800-165 MG chewable tablet Chew 1 tablet by mouth daily as needed.    ? Probiotic Product (PROBIOTIC PO) Take by mouth daily.    ? pseudoephedrine (SUDAFED) 30 MG tablet Take 30 mg  by mouth every 4 (four) hours as needed for congestion.    ? ?No current facility-administered medications on file prior to visit.  ? ?     ROS:  All others reviewed and negative. ? ?Objective  ? ?     PE:  BP 138/86 (BP Location: Left Arm, Patient Position: Sitting, Cuff Size: Large)   Pulse 60   Temp 98.1 ?F (36.7 ?C) (Oral)   Ht '6\' 3"'$  (1.905 m)   Wt 217 lb (98.4 kg)   SpO2 96%   BMI 27.12 kg/m?  ? ?              Constitutional: Pt appears in NAD ?              HENT: Head: NCAT.  ?              Right Ear: External ear normal.   ?              Left Ear: External ear normal.  ?              Eyes: . Pupils are  equal, round, and reactive to light. Conjunctivae and EOM are normal ?              Nose: without d/c or deformity ?              Neck: Neck supple. Gross normal ROM ?              Cardiovascular: Normal rate and regular rhythm.   ?              Pulmonary/Chest: Effort normal and breath sounds without rales or wheezing.  ?              Abd:  Soft, NT, ND, + BS, no organomegaly ?              Neurological: Pt is alert. At baseline orientation, motor grossly intact ?              Skin: Skin is warm. No rashes, no other new lesions, LE edema - none ?              Psychiatric: Pt behavior is normal without agitation  ? ?Micro: none ? ?Cardiac tracings I have personally interpreted today:  none ? ?Pertinent Radiological findings (summarize): none  ? ?Lab Results  ?Component Value Date  ? WBC 7.4 08/25/2021  ? HGB 15.2 08/25/2021  ? HCT 44.7 08/25/2021  ? PLT 213.0 08/25/2021  ? GLUCOSE 101 (H) 08/25/2021  ? CHOL 204 (H) 08/25/2021  ? TRIG 235.0 (H) 08/25/2021  ? HDL 57.00 08/25/2021  ? LDLDIRECT 107.0 08/25/2021  ? LDLCALC 103 (H) 08/01/2020  ? ALT 28 08/25/2021  ? AST 21 08/25/2021  ? NA 139 08/25/2021  ? K 4.3 08/25/2021  ? CL 102 08/25/2021  ? CREATININE 0.94 08/25/2021  ? BUN 28 (H) 08/25/2021  ? CO2 28 08/25/2021  ? TSH 3.99 08/25/2021  ? PSA 2.71 08/25/2021  ? HGBA1C 5.9 08/25/2021  ? ?Assessment/Plan:  ?Troy [name] is a 76 y.o. White or Caucasian [1] male with  has a past medical history of ALLERGIC RHINITIS (05/04/2007), BACK PAIN, LUMBAR (02/17/2010), CHEST PAIN (07/15/2009), CONSTIPATION (02/17/2010), Heart murmur, HYPERLIPIDEMIA (05/04/2007), HYPERTENSION (05/04/2007), Interstitial cystitis (08/30/2013), Lumbar disc disease (10/01/2011), NECK PAIN, ACUTE (07/15/2009), Prostate enlargement, PSVT (05/04/2007), Skin cancer (08/30/2013), Spermatocele, and VERTIGO (05/04/2007). ? ?Vitamin D deficiency ?Last vitamin D ?Lab Results  ?Component  Value Date  ? VD25OH 17.51 (L) 08/01/2020  ? ?Low, to start oral  replacement ? ? ?Hyperlipidemia ?Lab Results  ?Component Value Date  ? [place] 103 (H) 08/01/2020  ? ?uncontrolled pt to increase statin lipitor 40 mg with goal ldl < 70 ? ? ?Hyperglycemia ?Lab Results  ?Component Value Date  ? HGBA1C 5.9 08/25/2021  ? ?Stable, pt to continue current medical treatment  - diet ? ? ?Essential hypertension ?BP Readings from Last 3 Encounters:  ?08/25/21 138/86  ?07/18/21 122/79  ?06/20/21 (!) 162/100  ? ?Stable, pt to continue medical treatment tenormin, losartan ? ? ?Bladder neck obstruction ?Asympt, also for f/u psa ? ?B12 deficiency ?Lab Results  ?Component Value Date  ? [id number] 531 08/25/2021  ? ?Stable, cont oral replacement - b12 1000 mcg qd ? ?Followup: Return in about 1 year (around 08/26/2022). ? ?Cathlean Cower, MD 08/27/2021 7:01 PM ?[place] ?[place] ?Internal Medicine ?

## 2021-08-25 NOTE — Patient Instructions (Addendum)

## 2021-08-26 LAB — URINALYSIS, ROUTINE W REFLEX MICROSCOPIC
Bilirubin Urine: NEGATIVE
Hgb urine dipstick: NEGATIVE
Ketones, ur: NEGATIVE
Leukocytes,Ua: NEGATIVE
Nitrite: NEGATIVE
RBC / HPF: NONE SEEN (ref 0–?)
Specific Gravity, Urine: 1.015 (ref 1.000–1.030)
Total Protein, Urine: NEGATIVE
Urine Glucose: NEGATIVE
Urobilinogen, UA: 0.2 (ref 0.0–1.0)
WBC, UA: NONE SEEN (ref 0–?)
pH: 6.5 (ref 5.0–8.0)

## 2021-08-26 LAB — HEPATIC FUNCTION PANEL
ALT: 28 U/L (ref 0–53)
AST: 21 U/L (ref 0–37)
Albumin: 4.5 g/dL (ref 3.5–5.2)
Alkaline Phosphatase: 61 U/L (ref 39–117)
Bilirubin, Direct: 0.1 mg/dL (ref 0.0–0.3)
Total Bilirubin: 0.6 mg/dL (ref 0.2–1.2)
Total Protein: 7.3 g/dL (ref 6.0–8.3)

## 2021-08-26 LAB — BASIC METABOLIC PANEL
BUN: 28 mg/dL — ABNORMAL HIGH (ref 6–23)
CO2: 28 mEq/L (ref 19–32)
Calcium: 9.9 mg/dL (ref 8.4–10.5)
Chloride: 102 mEq/L (ref 96–112)
Creatinine, Ser: 0.94 mg/dL (ref 0.40–1.50)
GFR: 78.33 mL/min (ref >60.00)
Glucose, Bld: 101 mg/dL — ABNORMAL HIGH (ref 70–99)
Potassium: 4.3 mEq/L (ref 3.5–5.1)
Sodium: 139 mEq/L (ref 135–145)

## 2021-08-26 LAB — LIPID PANEL
Cholesterol: 204 mg/dL — ABNORMAL HIGH (ref 0–200)
HDL: 57 mg/dL (ref >39.00)
NonHDL: 146.99
Total CHOL/HDL Ratio: 4
Triglycerides: 235 mg/dL — ABNORMAL HIGH (ref 0.0–149.0)
VLDL: 47 mg/dL — ABNORMAL HIGH (ref 0.0–40.0)

## 2021-08-26 LAB — VITAMIN D 25 HYDROXY (VIT D DEFICIENCY, FRACTURES): VITD: 29.48 ng/mL — ABNORMAL LOW (ref 30.00–100.00)

## 2021-08-26 LAB — HEMOGLOBIN A1C: Hgb A1c MFr Bld: 5.9 % (ref 4.6–6.5)

## 2021-08-26 LAB — LDL CHOLESTEROL, DIRECT: Direct LDL: 107 mg/dL

## 2021-08-27 ENCOUNTER — Encounter: Payer: Self-pay | Admitting: Internal Medicine

## 2021-08-27 NOTE — Assessment & Plan Note (Signed)
Lab Results  ?Component Value Date  ? HGBA1C 5.9 08/25/2021  ? ?Stable, pt to continue current medical treatment  - diet ? ?

## 2021-08-27 NOTE — Assessment & Plan Note (Signed)
Asympt, also for f/u psa 

## 2021-08-27 NOTE — Assessment & Plan Note (Signed)
BP Readings from Last 3 Encounters:  ?08/25/21 138/86  ?07/18/21 122/79  ?06/20/21 (!) 162/100  ? ?Stable, pt to continue medical treatment tenormin, losartan ? ?

## 2021-08-27 NOTE — Assessment & Plan Note (Signed)
Lab Results  ?Component Value Date  ? [id number] 531 08/25/2021  ? ?Stable, cont oral replacement - b12 1000 mcg qd ? ?

## 2021-08-27 NOTE — Assessment & Plan Note (Signed)
Lab Results  ?Component Value Date  ? [place] 103 (H) 08/01/2020  ? ?uncontrolled pt to increase statin lipitor 40 mg with goal ldl < 70 ? ?

## 2021-12-01 DIAGNOSIS — M25512 Pain in left shoulder: Secondary | ICD-10-CM | POA: Diagnosis not present

## 2021-12-23 DIAGNOSIS — N401 Enlarged prostate with lower urinary tract symptoms: Secondary | ICD-10-CM | POA: Diagnosis not present

## 2021-12-23 DIAGNOSIS — R350 Frequency of micturition: Secondary | ICD-10-CM | POA: Diagnosis not present

## 2022-01-30 DIAGNOSIS — Z23 Encounter for immunization: Secondary | ICD-10-CM | POA: Diagnosis not present

## 2022-02-02 DIAGNOSIS — M79644 Pain in right finger(s): Secondary | ICD-10-CM | POA: Diagnosis not present

## 2022-03-10 ENCOUNTER — Telehealth: Payer: Self-pay | Admitting: Internal Medicine

## 2022-03-10 NOTE — Telephone Encounter (Signed)
Left message for patient to call back to schedule Medicare Annual Wellness Visit   Last AWV  01/16/21  Please schedule at anytime with [facility] if patient calls the office back.     Any questions, please call me at [phone]

## 2022-03-16 ENCOUNTER — Other Ambulatory Visit: Payer: Self-pay | Admitting: Internal Medicine

## 2022-04-09 NOTE — Progress Notes (Signed)
Cardiology Office Note:    Date:  04/10/2022   ID:  Troy [name], DOB [birth year], MRN [id number]  PCP:  Biagio Borg, MD   [facility] Cardiologist:  Lenna Sciara, MD Referring MD: Biagio Borg, MD   Chief Complaint/Reason for Referral: Coronary artery calcification  ASSESSMENT:    1. Coronary artery calcification seen on CAT scan   2. Aortic atherosclerosis ([place])   3. Hyperlipidemia LDL goal <70   4. Primary hypertension   5. Aortic valve calcification     PLAN:    In order of problems listed above: 1.  Coronary artery calcification: Continue aspirin, statin, and strict blood pressure control. 2.  Aortic atherosclerosis: Continue aspirin, statin, and strict blood pressure control. 3.  Hyperlipidemia: LDL in April was above goal at 103.  Will increase atorvastatin to 80 mg and check lipid panel, LFTs, and LP(a) in 2 months. 4.  Hypertension:  Controlled today. 5.  Aortic valve calcification: Seen on CTA.  Will obtain echocardiogram to evaluate further.            Dispo:  Return in about 1 year (around 04/11/2023).      Medication Adjustments/Labs and Tests Ordered: Current medicines are reviewed at length with the patient today.  Concerns regarding medicines are outlined above.  The following changes have been made:     Labs/tests ordered: Orders Placed This Encounter  Procedures   Lipid panel   Hepatic function panel   Lipoprotein A (LPA)   EKG 12-Lead   ECHOCARDIOGRAM COMPLETE    Medication Changes: Meds ordered this encounter  Medications   atorvastatin (LIPITOR) 80 MG tablet    Sig: Take 1 tablet (80 mg total) by mouth daily.    Dispense:  90 tablet    Refill:  3     Current medicines are reviewed at length with the patient today.  The patient does not have concerns regarding medicines.   History of Present Illness:    FOCUSED PROBLEM LIST:   1.  Hypertension 2.  Hyperlipidemia 3.  Right bundle branch block 4.  Mild  obstructive coronary artery disease on coronary CTA with aortic atherosclerosis  November 2022 consultation: Troy [name] is a 76 y.o. male with the indicated medical problems here for recommendations regarding chest pain.  The patient was recently seen in his primary care provider's office due to chest pain.  The patient had been working out and believes he pulled a muscle.  An EKG was done which demonstrated a right bundle branch block.  Review of his previous EKGs demonstrate no right bundle branch block.  He was referred urgently to cardiology for an evaluation.   He describes I upper chest tightness that is worse after he eats.  It is actually better when he exerts himself.  He took a Pepcid on the advice of his daughter and this seemed to help especially prior to eating.  He is relatively active and getting in shape for reunion.  He denies any palpitations, paroxysmal atrial dyspnea, orthopnea, peripheral edema, presyncope, syncope, signs or symptoms of stroke, or severe bleeding.  He has required no emergency room visits or hospitalizations.  Plan: Obtain coronary CTA.  Today: The patient presents for routine follow-up.  His coronary CTA done last year was reassuring with only mild obstructive disease.  He has been doing very well.  He has had a little bit of dyspnea getting over a URI.  He has had no exertional chest pain.  He  has had no myalgias or arthralgias on atorvastatin.  He denies any exertional angina, presyncope, syncope, severe bleeding or bruising, or any need for hospitalizations or emergency room visits.  He is otherwise well without complaints.         Current Medications: Current Meds  Medication Sig   aspirin 81 MG EC tablet Take 81 mg by mouth daily.   atenolol (TENORMIN) 25 MG tablet Take 2 tablets (50 mg total) by mouth daily.   atorvastatin (LIPITOR) 80 MG tablet Take 1 tablet (80 mg total) by mouth daily.   famotidine-calcium carbonate-magnesium hydroxide (PEPCID  COMPLETE) 10-800-165 MG chewable tablet Chew 1 tablet by mouth daily as needed.   losartan (COZAAR) 100 MG tablet Take 1 tablet (100 mg total) by mouth daily.   pantoprazole (PROTONIX) 40 MG tablet Take 1 tablet (40 mg total) by mouth daily.   Probiotic Product (PROBIOTIC PO) Take by mouth daily.   pseudoephedrine (SUDAFED) 30 MG tablet Take 30 mg by mouth every 4 (four) hours as needed for congestion.   traMADol (ULTRAM) 50 MG tablet TAKE 1 TABLET BY MOUTH EVERY 6 HOURS AS NEEDED   [DISCONTINUED] atorvastatin (LIPITOR) 40 MG tablet Take 1 tablet (40 mg total) by mouth daily.     Allergies:    Celebrex [celecoxib], Cyclobenzaprine hcl, and Mobic [meloxicam]   Social History:   Social History   Tobacco Use   Smoking status: Never   Smokeless tobacco: Never  Vaping Use   Vaping Use: Never used  Substance Use Topics   Alcohol use: No    Alcohol/week: 0.0 standard drinks of alcohol   Drug use: No     Family Hx: Family History  Problem Relation Age of Onset   Cancer Mother        breast cancer   Dementia Mother    Hypertension Mother    Hyperlipidemia Other    Diabetes Other    Heart disease Other      Review of Systems:   Please see the history of present illness.    All other systems reviewed and are negative.     EKGs/Labs/Other Test Reviewed:    EKG:  EKG performed today that I personally reviewed demonstrates sinus rhythm with right bundle branch block.  Prior CV studies: Coronary CTA 2022: 1. Coronary calcium score of 888. This was 74th percentile for age, sex, and race matched control 2. Normal coronary origin with left dominance. 3. Aortic atherosclerosis. 4. Aortic valve calcification with calcium score of 707. 5. CAD-RADS 2. Mild non-obstructive CAD (25-49%). Consider non-atherosclerotic causes of chest pain. Consider preventive therapy and risk factor modification. Sensitivity decreased in the setting of artifact; CT-FFR would not be available.  Other  studies Reviewed: Review of the additional studies/records demonstrates: None relevant  Recent Labs: 08/25/2021: ALT 28; BUN 28; Creatinine, Ser 0.94; Hemoglobin 15.2; Platelets 213.0; Potassium 4.3; Sodium 139; TSH 3.99   Recent Lipid Panel Lab Results  Component Value Date/Time   CHOL 204 (H) 08/25/2021 04:06 PM   TRIG 235.0 (H) 08/25/2021 04:06 PM   HDL 57.00 08/25/2021 04:06 PM   LDLCALC 103 (H) 08/01/2020 12:22 PM   LDLDIRECT 107.0 08/25/2021 04:06 PM    Risk Assessment/Calculations:                Physical Exam:    VS:  BP 138/70   Pulse 63   Ht '6\' 3"'$  (1.905 m)   Wt 215 lb (97.5 kg)   SpO2 97%   BMI 26.87  kg/m    Wt Readings from Last 3 Encounters:  04/10/22 215 lb (97.5 kg)  08/25/21 217 lb (98.4 kg)  07/18/21 219 lb (99.3 kg)    GENERAL:  No apparent distress, AOx3 HEENT:  No carotid bruits, +2 carotid impulses, no scleral icterus CAR: RRR no murmurs, gallops, rubs, or thrills RES:  Clear to auscultation bilaterally ABD:  Soft, nontender, nondistended, positive bowel sounds x 4 VASC:  +2 radial pulses, +2 carotid pulses, palpable pedal pulses NEURO:  CN 2-12 grossly intact; motor and sensory grossly intact PSYCH:  No active depression or anxiety EXT:  No edema, ecchymosis, or cyanosis  Signed, Early Osmond, MD  04/10/2022 8:30 AM    [address] [place], [place],   [address] Phone: [phone]; Fax: [phone]   Note:  This document was prepared using Dragon voice recognition software and may include unintentional dictation errors.

## 2022-04-10 ENCOUNTER — Ambulatory Visit: Payer: Medicare Other | Attending: Internal Medicine | Admitting: Internal Medicine

## 2022-04-10 ENCOUNTER — Encounter: Payer: Self-pay | Admitting: Internal Medicine

## 2022-04-10 VITALS — BP 138/70 | HR 63 | Ht 75.0 in | Wt 215.0 lb

## 2022-04-10 DIAGNOSIS — I700 Atherosclerosis of aorta: Secondary | ICD-10-CM | POA: Diagnosis not present

## 2022-04-10 DIAGNOSIS — I10 Essential (primary) hypertension: Secondary | ICD-10-CM | POA: Insufficient documentation

## 2022-04-10 DIAGNOSIS — I2510 Atherosclerotic heart disease of native coronary artery without angina pectoris: Secondary | ICD-10-CM | POA: Diagnosis not present

## 2022-04-10 DIAGNOSIS — E785 Hyperlipidemia, unspecified: Secondary | ICD-10-CM | POA: Insufficient documentation

## 2022-04-10 DIAGNOSIS — I359 Nonrheumatic aortic valve disorder, unspecified: Secondary | ICD-10-CM | POA: Diagnosis not present

## 2022-04-10 MED ORDER — ATORVASTATIN CALCIUM 80 MG PO TABS: 80.0000 mg | ORAL_TABLET | Freq: Every day | ORAL | 3 refills | Status: DC

## 2022-04-10 NOTE — Patient Instructions (Signed)
Medication Instructions:  INCREASE Atorvastatin(Lipitor) to '80mg'$  daily  *If you need a refill on your cardiac medications before your next appointment, please call your pharmacy*   Lab Work: Lipids, LFT's, LP(a) in 2 months If you have labs (blood work) drawn today and your tests are completely normal, you will receive your results only by: [place] (if you have MyChart) OR A paper copy in the mail If you have any lab test that is abnormal or we need to change your treatment, we will call you to review the results.  Testing/Procedures: ECHO Your physician has requested that you have an echocardiogram. Echocardiography is a painless test that uses sound waves to create images of your heart. It provides your doctor with information about the size and shape of your heart and how well your heart's chambers and valves are working. This procedure takes approximately one hour. There are no restrictions for this procedure. Please do NOT wear cologne, perfume, aftershave, or lotions (deodorant is allowed). Please arrive 15 minutes prior to your appointment time.  Follow-Up: At [facility], you and your health needs are our priority.  As part of our continuing mission to provide you with exceptional heart care, we have created designated Provider Care Teams.  These Care Teams include your primary Cardiologist (physician) and Advanced Practice Providers (APPs -  Physician Assistants and Nurse Practitioners) who all work together to provide you with the care you need, when you need it.  We recommend signing up for the patient portal called "MyChart".  Sign up information is provided on this After Visit Summary.  MyChart is used to connect with patients for Virtual Visits (Telemedicine).  Patients are able to view lab/test results, encounter notes, upcoming appointments, etc.  Non-urgent messages can be sent to your provider as well.   To learn more about what you can do with MyChart, go  to [email or web address].    Your next appointment:   1 year(s)  The format for your next appointment:   In Person  Provider:   Early Osmond, MD       Important Information About Sugar

## 2022-04-23 DIAGNOSIS — Z85828 Personal history of other malignant neoplasm of skin: Secondary | ICD-10-CM | POA: Diagnosis not present

## 2022-04-23 DIAGNOSIS — L570 Actinic keratosis: Secondary | ICD-10-CM | POA: Diagnosis not present

## 2022-04-23 DIAGNOSIS — L720 Epidermal cyst: Secondary | ICD-10-CM | POA: Diagnosis not present

## 2022-04-23 DIAGNOSIS — L815 Leukoderma, not elsewhere classified: Secondary | ICD-10-CM | POA: Diagnosis not present

## 2022-04-23 DIAGNOSIS — D1801 Hemangioma of skin and subcutaneous tissue: Secondary | ICD-10-CM | POA: Diagnosis not present

## 2022-04-23 DIAGNOSIS — D225 Melanocytic nevi of trunk: Secondary | ICD-10-CM | POA: Diagnosis not present

## 2022-04-23 DIAGNOSIS — L821 Other seborrheic keratosis: Secondary | ICD-10-CM | POA: Diagnosis not present

## 2022-04-23 DIAGNOSIS — L814 Other melanin hyperpigmentation: Secondary | ICD-10-CM | POA: Diagnosis not present

## 2022-04-23 DIAGNOSIS — L738 Other specified follicular disorders: Secondary | ICD-10-CM | POA: Diagnosis not present

## 2022-05-06 ENCOUNTER — Other Ambulatory Visit (HOSPITAL_COMMUNITY): Payer: Medicare Other

## 2022-05-12 DIAGNOSIS — M25512 Pain in left shoulder: Secondary | ICD-10-CM | POA: Diagnosis not present

## 2022-05-18 ENCOUNTER — Encounter: Payer: Self-pay | Admitting: Internal Medicine

## 2022-05-18 ENCOUNTER — Ambulatory Visit: Payer: Medicare Other | Admitting: Internal Medicine

## 2022-05-18 NOTE — Progress Notes (Unsigned)
    Subjective:    Patient ID: Troy [name], male    DOB: [birth year], 77 y.o.   MRN: [id number]      HPI Troy [name] is here for No chief complaint on file.    Fatigue, diarrhea, cold symptoms - symptoms started    Medications and allergies reviewed with patient and updated if appropriate.  Current Outpatient Medications on File Prior to Visit  Medication Sig Dispense Refill   aspirin 81 MG EC tablet Take 81 mg by mouth daily.     atenolol (TENORMIN) 25 MG tablet Take 2 tablets (50 mg total) by mouth daily. 180 tablet 3   atorvastatin (LIPITOR) 80 MG tablet Take 1 tablet (80 mg total) by mouth daily. 90 tablet 3   famotidine-calcium carbonate-magnesium hydroxide (PEPCID COMPLETE) 10-800-165 MG chewable tablet Chew 1 tablet by mouth daily as needed.     losartan (COZAAR) 100 MG tablet Take 1 tablet (100 mg total) by mouth daily. 90 tablet 3   pantoprazole (PROTONIX) 40 MG tablet Take 1 tablet (40 mg total) by mouth daily. 90 tablet 3   Probiotic Product (PROBIOTIC PO) Take by mouth daily.     pseudoephedrine (SUDAFED) 30 MG tablet Take 30 mg by mouth every 4 (four) hours as needed for congestion.     traMADol (ULTRAM) 50 MG tablet TAKE 1 TABLET BY MOUTH EVERY 6 HOURS AS NEEDED 60 tablet 2   No current facility-administered medications on file prior to visit.    Review of Systems     Objective:  There were no vitals filed for this visit. BP Readings from Last 3 Encounters:  04/10/22 138/70  08/25/21 138/86  07/18/21 122/79   Wt Readings from Last 3 Encounters:  04/10/22 215 lb (97.5 kg)  08/25/21 217 lb (98.4 kg)  07/18/21 219 lb (99.3 kg)   There is no height or weight on file to calculate BMI.    Physical Exam         Assessment & Plan:    See Problem List for Assessment and Plan of chronic medical problems.

## 2022-05-19 ENCOUNTER — Ambulatory Visit (INDEPENDENT_AMBULATORY_CARE_PROVIDER_SITE_OTHER): Payer: Medicare Other | Admitting: Internal Medicine

## 2022-05-19 VITALS — BP 126/74 | HR 78 | Temp 97.9°F | Ht 75.0 in | Wt 209.0 lb

## 2022-05-19 DIAGNOSIS — K219 Gastro-esophageal reflux disease without esophagitis: Secondary | ICD-10-CM | POA: Diagnosis not present

## 2022-05-19 DIAGNOSIS — E538 Deficiency of other specified B group vitamins: Secondary | ICD-10-CM

## 2022-05-19 DIAGNOSIS — R079 Chest pain, unspecified: Secondary | ICD-10-CM

## 2022-05-19 DIAGNOSIS — E559 Vitamin D deficiency, unspecified: Secondary | ICD-10-CM | POA: Diagnosis not present

## 2022-05-19 DIAGNOSIS — R5383 Other fatigue: Secondary | ICD-10-CM

## 2022-05-19 DIAGNOSIS — E7800 Pure hypercholesterolemia, unspecified: Secondary | ICD-10-CM | POA: Diagnosis not present

## 2022-05-19 DIAGNOSIS — R739 Hyperglycemia, unspecified: Secondary | ICD-10-CM | POA: Diagnosis not present

## 2022-05-19 LAB — COMPREHENSIVE METABOLIC PANEL
ALT: 51 U/L (ref 0–53)
AST: 21 U/L (ref 0–37)
Albumin: 4.3 g/dL (ref 3.5–5.2)
Alkaline Phosphatase: 56 U/L (ref 39–117)
BUN: 26 mg/dL — ABNORMAL HIGH (ref 6–23)
CO2: 29 mEq/L (ref 19–32)
Calcium: 10.1 mg/dL (ref 8.4–10.5)
Chloride: 100 mEq/L (ref 96–112)
Creatinine, Ser: 1 mg/dL (ref 0.40–1.50)
GFR: 72.35 mL/min (ref >60.00)
Glucose, Bld: 122 mg/dL — ABNORMAL HIGH (ref 70–99)
Potassium: 4.1 mEq/L (ref 3.5–5.1)
Sodium: 135 mEq/L (ref 135–145)
Total Bilirubin: 1 mg/dL (ref 0.2–1.2)
Total Protein: 7.4 g/dL (ref 6.0–8.3)

## 2022-05-19 LAB — CBC WITH DIFFERENTIAL/PLATELET
Basophils Absolute: 0.1 10*3/uL (ref 0.0–0.1)
Basophils Relative: 0.6 % (ref 0.0–3.0)
Eosinophils Absolute: 0.2 10*3/uL (ref 0.0–0.7)
Eosinophils Relative: 2 % (ref 0.0–5.0)
HCT: 48.4 % (ref 39.0–52.0)
Hemoglobin: 16.2 g/dL (ref 13.0–17.0)
Lymphocytes Relative: 21.4 % (ref 12.0–46.0)
Lymphs Abs: 2.4 10*3/uL (ref 0.7–4.0)
MCHC: 33.5 g/dL (ref 30.0–36.0)
MCV: 92 fl (ref 78.0–100.0)
Monocytes Absolute: 1 10*3/uL (ref 0.1–1.0)
Monocytes Relative: 8.5 % (ref 3.0–12.0)
Neutro Abs: 7.6 10*3/uL (ref 1.4–7.7)
Neutrophils Relative %: 67.5 % (ref 43.0–77.0)
Platelets: 326 10*3/uL (ref 150.0–400.0)
RBC: 5.26 Mil/uL (ref 4.22–5.81)
RDW: 13.1 % (ref 11.5–15.5)
WBC: 11.2 10*3/uL — ABNORMAL HIGH (ref 4.0–10.5)

## 2022-05-19 LAB — LIPID PANEL
Cholesterol: 175 mg/dL (ref 0–200)
HDL: 58.6 mg/dL (ref >39.00)
NonHDL: 115.96
Total CHOL/HDL Ratio: 3
Triglycerides: 213 mg/dL — ABNORMAL HIGH (ref 0.0–149.0)
VLDL: 42.6 mg/dL — ABNORMAL HIGH (ref 0.0–40.0)

## 2022-05-19 LAB — VITAMIN B12: Vitamin B-12: 518 pg/mL (ref 211–911)

## 2022-05-19 LAB — HEMOGLOBIN A1C: Hgb A1c MFr Bld: 6.1 % (ref 4.6–6.5)

## 2022-05-19 LAB — TSH: TSH: 4.47 u[IU]/mL (ref 0.35–5.50)

## 2022-05-19 LAB — VITAMIN D 25 HYDROXY (VIT D DEFICIENCY, FRACTURES): VITD: 44.14 ng/mL (ref 30.00–100.00)

## 2022-05-19 LAB — LDL CHOLESTEROL, DIRECT: Direct LDL: 81 mg/dL

## 2022-05-19 MED ORDER — PANTOPRAZOLE SODIUM 40 MG PO TBEC: 40.0000 mg | DELAYED_RELEASE_TABLET | Freq: Two times a day (BID) | ORAL | 3 refills | Status: DC

## 2022-05-19 NOTE — Patient Instructions (Addendum)
Blood work was ordered.   The lab is on the first floor.    Medications changes include :   increase pantoprazole to 40 mg twice a day - take 30 minutes prior to a meal.  Stay at the 40 mg of the atorvastatin for now.      Return if symptoms worsen or fail to improve.   Gastroesophageal Reflux Disease, Adult Gastroesophageal reflux (GER) happens when acid from the stomach flows up into the tube that connects the mouth and the stomach (esophagus). Normally, food travels down the esophagus and stays in the stomach to be digested. However, when a person has GER, food and stomach acid sometimes move back up into the esophagus. If this becomes a more serious problem, the person may be diagnosed with a disease called gastroesophageal reflux disease (GERD). GERD occurs when the reflux: Happens often. Causes frequent or severe symptoms. Causes problems such as damage to the esophagus. When stomach acid comes in contact with the esophagus, the acid may cause inflammation in the esophagus. Over time, GERD may create small holes (ulcers) in the lining of the esophagus. What are the causes? This condition is caused by a problem with the muscle between the esophagus and the stomach (lower esophageal sphincter, or LES). Normally, the LES muscle closes after food passes through the esophagus to the stomach. When the LES is weakened or abnormal, it does not close properly, and that allows food and stomach acid to go back up into the esophagus. The LES can be weakened by certain dietary substances, medicines, and medical conditions, including: Tobacco use. Pregnancy. Having a hiatal hernia. Alcohol use. Certain foods and beverages, such as coffee, chocolate, onions, and peppermint. What increases the risk? You are more likely to develop this condition if you: Have an increased body weight. Have a connective tissue disorder. Take NSAIDs, such as ibuprofen. What are the signs or  symptoms? Symptoms of this condition include: Heartburn. Difficult or painful swallowing and the feeling of having a lump in the throat. A bitter taste in the mouth. Bad breath and having a large amount of saliva. Having an upset or bloated stomach and belching. Chest pain. Different conditions can cause chest pain. Make sure you see your health care provider if you experience chest pain. Shortness of breath or wheezing. Ongoing (chronic) cough or a nighttime cough. Wearing away of tooth enamel. Weight loss. How is this diagnosed? This condition may be diagnosed based on a medical history and a physical exam. To determine if you have mild or severe GERD, your health care provider may also monitor how you respond to treatment. You may also have tests, including: A test to examine your stomach and esophagus with a small camera (endoscopy). A test that measures the acidity level in your esophagus. A test that measures how much pressure is on your esophagus. A barium swallow or modified barium swallow test to show the shape, size, and functioning of your esophagus. How is this treated? Treatment for this condition may vary depending on how severe your symptoms are. Your health care provider may recommend: Changes to your diet. Medicine. Surgery. The goal of treatment is to help relieve your symptoms and to prevent complications. Follow these instructions at home: Eating and drinking  Follow a diet as recommended by your health care provider. This may involve avoiding foods and drinks such as: Coffee and tea, with or without caffeine. Drinks that contain alcohol. Energy drinks and sports drinks. Carbonated drinks  or sodas. Chocolate and cocoa. Peppermint and mint flavorings. Garlic and onions. Horseradish. Spicy and acidic foods, including peppers, chili powder, curry powder, vinegar, hot sauces, and barbecue sauce. Citrus fruit juices and citrus fruits, such as oranges, lemons, and  limes. Tomato-based foods, such as red sauce, chili, salsa, and pizza with red sauce. Fried and fatty foods, such as donuts, french fries, potato chips, and high-fat dressings. High-fat meats, such as hot dogs and fatty cuts of red and white meats, such as rib eye steak, sausage, ham, and bacon. High-fat dairy items, such as whole milk, butter, and cream cheese. Eat small, frequent meals instead of large meals. Avoid drinking large amounts of liquid with your meals. Avoid eating meals during the 2-3 hours before bedtime. Avoid lying down right after you eat. Do not exercise right after you eat. Lifestyle  Do not use any products that contain nicotine or tobacco. These products include cigarettes, chewing tobacco, and vaping devices, such as e-cigarettes. If you need help quitting, ask your health care provider. Try to reduce your stress by using methods such as yoga or meditation. If you need help reducing stress, ask your health care provider. If you are overweight, reduce your weight to an amount that is healthy for you. Ask your health care provider for guidance about a safe weight loss goal. General instructions Pay attention to any changes in your symptoms. Take over-the-counter and prescription medicines only as told by your health care provider. Do not take aspirin, ibuprofen, or other NSAIDs unless your health care provider told you to take these medicines. Wear loose-fitting clothing. Do not wear anything tight around your waist that causes pressure on your abdomen. Raise (elevate) the head of your bed about 6 inches (15 cm). You can use a wedge to do this. Avoid bending over if this makes your symptoms worse. Keep all follow-up visits. This is important. Contact a health care provider if: You have: New symptoms. Unexplained weight loss. Difficulty swallowing or it hurts to swallow. Wheezing or a persistent cough. A hoarse voice. Your symptoms do not improve with treatment. Get  help right away if: You have sudden pain in your arms, neck, jaw, teeth, or back. You suddenly feel sweaty, dizzy, or light-headed. You have chest pain or shortness of breath. You vomit and the vomit is green, yellow, or black, or it looks like blood or coffee grounds. You faint. You have stool that is red, bloody, or black. You cannot swallow, drink, or eat. These symptoms may represent a serious problem that is an emergency. Do not wait to see if the symptoms will go away. Get medical help right away. Call your local emergency services (911 in the [place] Do not drive yourself to the hospital. Summary Gastroesophageal reflux happens when acid from the stomach flows up into the esophagus. GERD is a disease in which the reflux happens often, causes frequent or severe symptoms, or causes problems such as damage to the esophagus. Treatment for this condition may vary depending on how severe your symptoms are. Your health care provider may recommend diet and lifestyle changes, medicine, or surgery. Contact a health care provider if you have new or worsening symptoms. Take over-the-counter and prescription medicines only as told by your health care provider. Do not take aspirin, ibuprofen, or other NSAIDs unless your health care provider told you to do so. Keep all follow-up visits as told by your health care provider. This is important. This information is not intended to replace advice given  to you by your health care provider. Make sure you discuss any questions you have with your health care provider. Document Revised: 10/30/2019 Document Reviewed: 10/30/2019 Elsevier Patient Education  [place].

## 2022-05-26 ENCOUNTER — Ambulatory Visit (HOSPITAL_COMMUNITY): Payer: Medicare Other | Attending: Internal Medicine

## 2022-05-26 DIAGNOSIS — I359 Nonrheumatic aortic valve disorder, unspecified: Secondary | ICD-10-CM | POA: Insufficient documentation

## 2022-05-26 DIAGNOSIS — I350 Nonrheumatic aortic (valve) stenosis: Secondary | ICD-10-CM | POA: Diagnosis not present

## 2022-05-26 DIAGNOSIS — I2510 Atherosclerotic heart disease of native coronary artery without angina pectoris: Secondary | ICD-10-CM | POA: Insufficient documentation

## 2022-05-26 DIAGNOSIS — I700 Atherosclerosis of aorta: Secondary | ICD-10-CM | POA: Diagnosis not present

## 2022-05-26 DIAGNOSIS — E785 Hyperlipidemia, unspecified: Secondary | ICD-10-CM | POA: Insufficient documentation

## 2022-05-26 DIAGNOSIS — I10 Essential (primary) hypertension: Secondary | ICD-10-CM | POA: Insufficient documentation

## 2022-05-28 LAB — ECHOCARDIOGRAM COMPLETE
AR max vel: 1.94 cm2
AV Area VTI: 2.11 cm2
AV Area mean vel: 1.9 cm2
AV Mean grad: 10 mmHg
AV Peak grad: 18.7 mmHg
Ao pk vel: 2.16 m/s
Area-P 1/2: 2.37 cm2
S' Lateral: 3 cm

## 2022-06-11 ENCOUNTER — Other Ambulatory Visit: Payer: Medicare Other

## 2022-06-18 ENCOUNTER — Encounter: Payer: Self-pay | Admitting: Internal Medicine

## 2022-06-18 NOTE — Progress Notes (Signed)
Subjective:    Patient ID: Troy [name], male    DOB: 1945/08/14, 77 y.o.   MRN: [id number]     HPI Troy [name] is here for follow up of his chronic medical problems, including GERD, left sided chest pain, fatigue, hyperlipidemia  Was seen by me 4 weeks ago.  GERD, left-sided chest pain-GERD was causing the pain-noncardiac.  Increased Protonix 40 mg to twice daily and advised to take 30 minutes prior to meal.  Advised decreasing ibuprofen.  He has done both of those things and is eating a more bland diet his chest pain is much improved and almost completely gone.  Every once in a while he will get a twinge of pain in the left chest area.  Fatigue-?  Related to increasing statin versus back-to-back illnesses.  He did decrease his statin to 40 mg daily - 80 had fatigue and leg pain and that did help.  His sleep is not great, which could also be contributing to his fatigue.  He gets up to go to the bathroom a few times, but does take to 1 hour naps during the day and overall he feels he is getting enough sleep.  He is back to walking regularly.  He is drinking lots of water.  Both of these have helped.  Constipation/diarrhea: Every 3 weeks he is completely emptying his bowels.  3 times he went to the bathroom 26 times or so-the stool is very soft and then eventually turns in the diarrhea.  He is watching his diet diet which helps - brat diet has helps. Is constipated prior to having these episodes.     Medications and allergies reviewed with patient and updated if appropriate.  Current Outpatient Medications on File Prior to Visit  Medication Sig Dispense Refill   aspirin 81 MG EC tablet Take 81 mg by mouth daily.     atenolol (TENORMIN) 25 MG tablet Take 2 tablets (50 mg total) by mouth daily. 180 tablet 3   atorvastatin (LIPITOR) 80 MG tablet Take 1 tablet (80 mg total) by mouth daily. 90 tablet 3   famotidine-calcium carbonate-magnesium hydroxide (PEPCID COMPLETE) 10-800-165 MG  chewable tablet Chew 1 tablet by mouth daily as needed.     losartan (COZAAR) 100 MG tablet Take 1 tablet (100 mg total) by mouth daily. 90 tablet 3   pantoprazole (PROTONIX) 40 MG tablet Take 1 tablet (40 mg total) by mouth 2 (two) times daily before a meal. 180 tablet 3   Probiotic Product (PROBIOTIC PO) Take by mouth daily.     pseudoephedrine (SUDAFED) 30 MG tablet Take 30 mg by mouth every 4 (four) hours as needed for congestion.     traMADol (ULTRAM) 50 MG tablet TAKE 1 TABLET BY MOUTH EVERY 6 HOURS AS NEEDED 60 tablet 2   No current facility-administered medications on file prior to visit.     Review of Systems  Constitutional:  Positive for fatigue.  Respiratory:  Negative for shortness of breath.   Cardiovascular:  Positive for chest pain (occ tightness - always related to food). Negative for palpitations and leg swelling.       Objective:   Vitals:   06/19/22 1024  BP: 132/68  Pulse: 65  Temp: 98 F (36.7 C)  SpO2: 97%   BP Readings from Last 3 Encounters:  06/19/22 132/68  05/19/22 126/74  04/10/22 138/70   Wt Readings from Last 3 Encounters:  06/19/22 212 lb (96.2 kg)  05/19/22 209 lb (94.8  kg)  04/10/22 215 lb (97.5 kg)   Body mass index is 26.5 kg/m.    Physical Exam Constitutional:      General: He is not in acute distress.    Appearance: Normal appearance. He is not ill-appearing.  HENT:     Head: Normocephalic and atraumatic.  Eyes:     Conjunctiva/sclera: Conjunctivae normal.  Cardiovascular:     Rate and Rhythm: Normal rate and regular rhythm.     Heart sounds: Normal heart sounds. No murmur heard. Pulmonary:     Effort: Pulmonary effort is normal. No respiratory distress.     Breath sounds: Normal breath sounds. No wheezing or rales.  Musculoskeletal:     Right lower leg: No edema.     Left lower leg: No edema.  Skin:    General: Skin is warm and dry.     Findings: No rash.  Neurological:     Mental Status: He is alert. Mental status  is at baseline.  Psychiatric:        Mood and Affect: Mood normal.        Lab Results  Component Value Date   WBC 11.2 (H) 05/19/2022   HGB 16.2 05/19/2022   HCT 48.4 05/19/2022   PLT 326.0 05/19/2022   GLUCOSE 122 (H) 05/19/2022   CHOL 175 05/19/2022   TRIG 213.0 (H) 05/19/2022   HDL 58.60 05/19/2022   LDLDIRECT 81.0 05/19/2022   LDLCALC 103 (H) 08/01/2020   ALT 51 05/19/2022   AST 21 05/19/2022   NA 135 05/19/2022   K 4.1 05/19/2022   CL 100 05/19/2022   CREATININE 1.00 05/19/2022   BUN 26 (H) 05/19/2022   CO2 29 05/19/2022   TSH 4.47 05/19/2022   PSA 2.71 08/25/2021   HGBA1C 6.1 05/19/2022     Assessment & Plan:    Hyperlipidemia: Chronic Cardiology upped his atorvastatin to 80 mg daily, but this caused leg pain and fatigue so we decreased it down to 40 mg daily and does feel better Continue 40 mg daily Will try adding Zetia 10 mg daily He is also exercising regularly and has improved his diet Lipid panel, hepatic function in 6 weeks  GERD, left-sided chest pain: GERD was the cause of his left-sided chest pain Improved with increasing pantoprazole to twice daily and taking it 30 minutes prior to meals Also taking Tums as needed He is also decreased his ibuprofen to 0-1 times a day He has decreased spicy foods and eliminated fried foods Overall improved Continue pantoprazole twice daily-eventually think he will be able to go back to once daily  Constipation, episodes of diarrhea: Chronic, intermittent He has been having the pattern of constipation that leads to an emptying of his bowels which is soft stool-diarrhea and multiple times a day He is walking regularly, has improved the diet and is drinking plenty of water which will likely help Discussed avoiding the constipation which should help avoid those episodes

## 2022-06-19 ENCOUNTER — Ambulatory Visit (INDEPENDENT_AMBULATORY_CARE_PROVIDER_SITE_OTHER): Payer: Medicare Other | Admitting: Internal Medicine

## 2022-06-19 VITALS — BP 132/68 | HR 65 | Temp 98.0°F | Ht 75.0 in | Wt 212.0 lb

## 2022-06-19 DIAGNOSIS — K5900 Constipation, unspecified: Secondary | ICD-10-CM

## 2022-06-19 DIAGNOSIS — K219 Gastro-esophageal reflux disease without esophagitis: Secondary | ICD-10-CM | POA: Diagnosis not present

## 2022-06-19 DIAGNOSIS — I2510 Atherosclerotic heart disease of native coronary artery without angina pectoris: Secondary | ICD-10-CM

## 2022-06-19 DIAGNOSIS — I10 Essential (primary) hypertension: Secondary | ICD-10-CM | POA: Diagnosis not present

## 2022-06-19 DIAGNOSIS — E785 Hyperlipidemia, unspecified: Secondary | ICD-10-CM | POA: Diagnosis not present

## 2022-06-19 DIAGNOSIS — E7800 Pure hypercholesterolemia, unspecified: Secondary | ICD-10-CM

## 2022-06-19 MED ORDER — EZETIMIBE 10 MG PO TABS: 10.0000 mg | ORAL_TABLET | Freq: Every day | ORAL | 3 refills | Status: DC

## 2022-06-19 MED ORDER — ATORVASTATIN CALCIUM 80 MG PO TABS: 40.0000 mg | ORAL_TABLET | Freq: Every day | ORAL | 3 refills | Status: DC

## 2022-06-19 NOTE — Patient Instructions (Signed)
      Blood work was ordered.   Have this done in 6 weeks.     Medications changes include :   start zetia 10 mg daily for your cholesterol

## 2022-06-23 ENCOUNTER — Telehealth: Payer: Self-pay

## 2022-06-23 NOTE — Telephone Encounter (Signed)
Called patient to schedule Medicare Annual Wellness Visit (AWV). Left message for patient to call back and schedule Medicare Annual Wellness Visit (AWV).  Last date of AWV: 01/16/21  Please schedule an appointment at any time with [facility].  If any questions, please contact me.  Thank you ,  Norton Blizzard, [place] ([place])  [facility] [phone]

## 2022-07-14 DIAGNOSIS — D3131 Benign neoplasm of right choroid: Secondary | ICD-10-CM | POA: Diagnosis not present

## 2022-07-14 DIAGNOSIS — H524 Presbyopia: Secondary | ICD-10-CM | POA: Diagnosis not present

## 2022-07-14 DIAGNOSIS — H353131 Nonexudative age-related macular degeneration, bilateral, early dry stage: Secondary | ICD-10-CM | POA: Diagnosis not present

## 2022-07-14 DIAGNOSIS — H04123 Dry eye syndrome of bilateral lacrimal glands: Secondary | ICD-10-CM | POA: Diagnosis not present

## 2022-07-14 DIAGNOSIS — H43812 Vitreous degeneration, left eye: Secondary | ICD-10-CM | POA: Diagnosis not present

## 2022-09-01 ENCOUNTER — Other Ambulatory Visit (INDEPENDENT_AMBULATORY_CARE_PROVIDER_SITE_OTHER): Payer: Medicare Other

## 2022-09-01 DIAGNOSIS — E785 Hyperlipidemia, unspecified: Secondary | ICD-10-CM | POA: Diagnosis not present

## 2022-09-01 LAB — LIPID PANEL
Cholesterol: 131 mg/dL (ref 0–200)
HDL: 46.1 mg/dL (ref >39.00)
LDL Cholesterol: 46 mg/dL (ref 0–99)
NonHDL: 85.33
Total CHOL/HDL Ratio: 3
Triglycerides: 197 mg/dL — ABNORMAL HIGH (ref 0.0–149.0)
VLDL: 39.4 mg/dL (ref 0.0–40.0)

## 2022-09-01 LAB — HEPATIC FUNCTION PANEL
ALT: 24 U/L (ref 0–53)
AST: 22 U/L (ref 0–37)
Albumin: 4.1 g/dL (ref 3.5–5.2)
Alkaline Phosphatase: 56 U/L (ref 39–117)
Bilirubin, Direct: 0.1 mg/dL (ref 0.0–0.3)
Total Bilirubin: 0.6 mg/dL (ref 0.2–1.2)
Total Protein: 7 g/dL (ref 6.0–8.3)

## 2022-09-16 ENCOUNTER — Telehealth: Payer: Self-pay | Admitting: Radiology

## 2022-09-16 NOTE — Telephone Encounter (Signed)
Contacted Dolan Amen to schedule their annual wellness visit. Patient to Call back at later date: 10/02/22  Paityn Balsam K. CMA

## 2022-09-21 ENCOUNTER — Telehealth: Payer: Self-pay | Admitting: Internal Medicine

## 2022-09-21 ENCOUNTER — Other Ambulatory Visit: Payer: Self-pay | Admitting: Internal Medicine

## 2022-09-21 NOTE — Telephone Encounter (Signed)
error 

## 2022-09-22 ENCOUNTER — Other Ambulatory Visit: Payer: Self-pay | Admitting: Internal Medicine

## 2022-09-25 ENCOUNTER — Ambulatory Visit (INDEPENDENT_AMBULATORY_CARE_PROVIDER_SITE_OTHER): Payer: Medicare Other | Admitting: Internal Medicine

## 2022-09-25 ENCOUNTER — Encounter: Payer: Self-pay | Admitting: Internal Medicine

## 2022-09-25 VITALS — BP 124/78 | HR 60 | Temp 98.8°F | Ht 75.0 in | Wt 215.0 lb

## 2022-09-25 DIAGNOSIS — J329 Chronic sinusitis, unspecified: Secondary | ICD-10-CM | POA: Diagnosis not present

## 2022-09-25 DIAGNOSIS — I10 Essential (primary) hypertension: Secondary | ICD-10-CM | POA: Diagnosis not present

## 2022-09-25 DIAGNOSIS — E559 Vitamin D deficiency, unspecified: Secondary | ICD-10-CM | POA: Diagnosis not present

## 2022-09-25 DIAGNOSIS — E7800 Pure hypercholesterolemia, unspecified: Secondary | ICD-10-CM | POA: Diagnosis not present

## 2022-09-25 DIAGNOSIS — R739 Hyperglycemia, unspecified: Secondary | ICD-10-CM

## 2022-09-25 DIAGNOSIS — E538 Deficiency of other specified B group vitamins: Secondary | ICD-10-CM | POA: Diagnosis not present

## 2022-09-25 DIAGNOSIS — H3530 Unspecified macular degeneration: Secondary | ICD-10-CM

## 2022-09-25 MED ORDER — LOSARTAN POTASSIUM 100 MG PO TABS: 100.0000 mg | ORAL_TABLET | Freq: Every day | ORAL | 3 refills | Status: DC

## 2022-09-25 NOTE — Patient Instructions (Addendum)
Please have your Shingrix (shingles) shots done at your local pharmacy., and the Tdap tetanus shot  Please consider seeing Dr Suszanne Conners ENT for the sinus issue  Please continue all other medications as before, and refills have been done if requested.  Please have the pharmacy call with any other refills you may need.  Please continue your efforts at being more active, low cholesterol diet, and weight control.  You are otherwise up to date with prevention measures today.  Please keep your appointments with your specialists as you may have planned - ophthalmology for the left macular degeneration  No further lab work needed today  Please make an Appointment to return for your 1 year visit, or sooner if needed

## 2022-09-25 NOTE — Progress Notes (Unsigned)
Patient ID: Troy [name], male   DOB: 1946/01/24, 77 y.o.   MRN: [id number]        Chief Complaint: follow up HTN, HLD and hyperglycemia , recent left macular degeneration, chronic sinusitis, low vit d       HPI:  Troy [name] is a 77 y.o. male here overall doing ok,  Pt denies chest pain, increased sob or doe, wheezing, orthopnea, PND, increased LE swelling, palpitations, dizziness or syncope.   Pt denies polydipsia, polyuria, or new focal neuro s/s.    Pt denies fever, wt loss, night sweats, loss of appetite, or other constitutional symptoms  Does have ongoing sinus congestion and pressure, mild to mod most days ongoing for several months, get the sense he is not draining well, not ready to see allergy or ent for now.  Has noticed some increase doff balance recently.  Has new diagnosis left macular degeneration, no tx needed for now, to start regular optho visits.   Wt Readings from Last 3 Encounters:  09/25/22 215 lb (97.5 kg)  06/19/22 212 lb (96.2 kg)  05/19/22 209 lb (94.8 kg)   BP Readings from Last 3 Encounters:  09/25/22 124/78  06/19/22 132/68  05/19/22 126/74         Past Medical History:  Diagnosis Date   ALLERGIC RHINITIS 05/04/2007   BACK PAIN, LUMBAR 02/17/2010   CHEST PAIN 07/15/2009   CONSTIPATION 02/17/2010   Heart murmur    remote hx of murmur   HYPERLIPIDEMIA 05/04/2007   HYPERTENSION 05/04/2007   Interstitial cystitis 08/30/2013   Lumbar disc disease 10/01/2011   NECK PAIN, ACUTE 07/15/2009   Prostate enlargement    PSVT 05/04/2007   Skin cancer 08/30/2013   Basal cell x 2, dec 2014   Spermatocele    VERTIGO 05/04/2007   Past Surgical History:  Procedure Laterality Date   APPENDECTOMY     KNEE ARTHROSCOPY  30 yrs ago   s/p dental implant left upper jaw  11/09   also rt lower jaw   s/p RK surgery bilat     SPERMATOCELECTOMY  12/04/2011   Procedure: SPERMATOCELECTOMY;  Surgeon: Lindaann Slough, MD;  Location: [facility];  Service: Urology;   Laterality: Left;  1 hour requested for this case    TONSILLECTOMY      reports that he has never smoked. He has never used smokeless tobacco. He reports that he does not drink alcohol and does not use drugs. family history includes Cancer in his mother; Dementia in his mother; Diabetes in an other family member; Heart disease in an other family member; Hyperlipidemia in an other family member; Hypertension in his mother. No Active Allergies Current Outpatient Medications on File Prior to Visit  Medication Sig Dispense Refill   aspirin 81 MG EC tablet Take 81 mg by mouth daily.     atenolol (TENORMIN) 25 MG tablet Take 2 tablets (50 mg total) by mouth daily. 180 tablet 3   atorvastatin (LIPITOR) 80 MG tablet Take 0.5 tablets (40 mg total) by mouth daily. 90 tablet 3   bismuth subsalicylate (RA STOMACH RELIEF) 262 MG/15ML suspension Take by mouth.     ezetimibe (ZETIA) 10 MG tablet Take 1 tablet (10 mg total) by mouth daily. 90 tablet 3   famotidine-calcium carbonate-magnesium hydroxide (PEPCID COMPLETE) 10-800-165 MG chewable tablet Chew 1 tablet by mouth daily as needed.     fluticasone (FLONASE) 50 MCG/ACT nasal spray 2 sprays.     metoprolol succinate (TOPROL-XL) 50  MG 24 hr tablet Take by mouth.     pantoprazole (PROTONIX) 40 MG tablet Take 1 tablet (40 mg total) by mouth 2 (two) times daily before a meal. 180 tablet 3   Probiotic Product (PROBIOTIC PO) Take by mouth daily.     pseudoephedrine (SUDAFED) 30 MG tablet Take 30 mg by mouth every 4 (four) hours as needed for congestion.     traMADol (ULTRAM) 50 MG tablet TAKE 1 TABLET BY MOUTH EVERY 6 HOURS AS NEEDED 60 tablet 0   No current facility-administered medications on file prior to visit.        ROS:  All others reviewed and negative.  Objective        PE:  BP 124/78 (BP Location: Right Arm, Patient Position: Sitting, Cuff Size: Normal)   Pulse 60   Temp 98.8 F (37.1 C) (Oral)   Ht 6\' 3"  (1.905 m)   Wt 215 lb (97.5 kg)    SpO2 96%   BMI 26.87 kg/m                 Constitutional: Pt appears in NAD               HENT: Head: NCAT.                Right Ear: External ear normal.                 Left Ear: External ear normal. Bilat tm's with mild erythema.  Max sinus areas non tender.  Pharynx with mild erythema, no exudate               Eyes: . Pupils are equal, round, and reactive to light. Conjunctivae and EOM are normal               Nose: without d/c or deformity               Neck: Neck supple. Gross normal ROM               Cardiovascular: Normal rate and regular rhythm.                 Pulmonary/Chest: Effort normal and breath sounds without rales or wheezing.                Abd:  Soft, NT, ND, + BS, no organomegaly               Neurological: Pt is alert. At baseline orientation, motor grossly intact               Skin: Skin is warm. No rashes, no other new lesions, LE edema - none               Psychiatric: Pt behavior is normal without agitation   Micro: none  Cardiac tracings I have personally interpreted today:  none  Pertinent Radiological findings (summarize): none   Lab Results  Component Value Date   WBC 11.2 (H) 05/19/2022   HGB 16.2 05/19/2022   HCT 48.4 05/19/2022   PLT 326.0 05/19/2022   GLUCOSE 122 (H) 05/19/2022   CHOL 131 09/01/2022   TRIG 197.0 (H) 09/01/2022   HDL 46.10 09/01/2022   LDLDIRECT 81.0 05/19/2022   LDLCALC 46 09/01/2022   ALT 24 09/01/2022   AST 22 09/01/2022   NA 135 05/19/2022   K 4.1 05/19/2022   CL 100 05/19/2022   CREATININE 1.00 05/19/2022   BUN 26 (H) 05/19/2022   CO2  29 05/19/2022   TSH 4.47 05/19/2022   PSA 2.71 08/25/2021   HGBA1C 6.1 05/19/2022   Assessment/Plan:  Troy [name] is a 77 y.o. White or Caucasian [1] male with  has a past medical history of ALLERGIC RHINITIS (05/04/2007), BACK PAIN, LUMBAR (02/17/2010), CHEST PAIN (07/15/2009), CONSTIPATION (02/17/2010), Heart murmur, HYPERLIPIDEMIA (05/04/2007), HYPERTENSION (05/04/2007),  Interstitial cystitis (08/30/2013), Lumbar disc disease (10/01/2011), NECK PAIN, ACUTE (07/15/2009), Prostate enlargement, PSVT (05/04/2007), Skin cancer (08/30/2013), Spermatocele, and VERTIGO (05/04/2007).  Macular degeneration New recent onset, may be wet but small early, and no tx for now, for regular optho f/u as planned  Hyperlipidemia Lab Results  Component Value Date   LDLCALC 46 09/01/2022   Stable, pt to continue current statin lipitor 80 qd, zetia 10 qd   Essential hypertension BP Readings from Last 3 Encounters:  09/25/22 124/78  06/19/22 132/68  05/19/22 126/74   Stable, pt to continue medical treatment tenormin 25 qd, toprol xl 50 qd, losartan 100 qd   Hyperglycemia Lab Results  Component Value Date   HGBA1C 6.1 05/19/2022   Stable, pt to continue current medical treatment  - diet, wt control   Vitamin D deficiency Last vitamin D Lab Results  Component Value Date   VD25OH 44.14 05/19/2022   Stable, cont oral replacement   B12 deficiency Lab Results  Component Value Date   VITAMINB12 518 05/19/2022   Stable, cont oral replacement - b12 1000 mcg qd   Chronic sinusitis Mild worsening, for restart flonase, also otc allegra prn, consider pt to make f/u appt with ENT such as Dr Suszanne Conners  Followup: Return in about 1 year (around 09/25/2023).  Oliver Barre, MD 09/28/2022 1:21 PM [facility]

## 2022-09-28 ENCOUNTER — Encounter: Payer: Self-pay | Admitting: Internal Medicine

## 2022-09-28 DIAGNOSIS — J329 Chronic sinusitis, unspecified: Secondary | ICD-10-CM | POA: Insufficient documentation

## 2022-09-28 NOTE — Assessment & Plan Note (Signed)
New recent onset, may be wet but small early, and no tx for now, for regular optho f/u as planned

## 2022-09-28 NOTE — Assessment & Plan Note (Signed)
Lab Results  Component Value Date   LDLCALC 46 09/01/2022   Stable, pt to continue current statin lipitor 80 qd, zetia 10 qd

## 2022-09-28 NOTE — Assessment & Plan Note (Addendum)
Mild worsening, for restart flonase, also otc allegra prn, consider pt to make f/u appt with ENT such as Dr Suszanne Conners

## 2022-09-28 NOTE — Assessment & Plan Note (Signed)
Lab Results  Component Value Date   VITAMINB12 518 05/19/2022   Stable, cont oral replacement - b12 1000 mcg qd

## 2022-09-28 NOTE — Assessment & Plan Note (Signed)
BP Readings from Last 3 Encounters:  09/25/22 124/78  06/19/22 132/68  05/19/22 126/74   Stable, pt to continue medical treatment tenormin 25 qd, toprol xl 50 qd, losartan 100 qd

## 2022-09-28 NOTE — Assessment & Plan Note (Signed)
Last vitamin D Lab Results  Component Value Date   VD25OH 44.14 05/19/2022   Stable, cont oral replacement

## 2022-09-28 NOTE — Assessment & Plan Note (Signed)
Lab Results  Component Value Date   HGBA1C 6.1 05/19/2022   Stable, pt to continue current medical treatment  - diet, wt control

## 2022-10-05 DIAGNOSIS — M7542 Impingement syndrome of left shoulder: Secondary | ICD-10-CM | POA: Diagnosis not present

## 2022-10-12 ENCOUNTER — Encounter: Payer: Self-pay | Admitting: Internal Medicine

## 2022-10-27 ENCOUNTER — Other Ambulatory Visit: Payer: Self-pay | Admitting: Internal Medicine

## 2022-11-23 DIAGNOSIS — Z961 Presence of intraocular lens: Secondary | ICD-10-CM | POA: Diagnosis not present

## 2022-11-23 DIAGNOSIS — H01023 Squamous blepharitis right eye, unspecified eyelid: Secondary | ICD-10-CM | POA: Diagnosis not present

## 2022-11-23 DIAGNOSIS — H04123 Dry eye syndrome of bilateral lacrimal glands: Secondary | ICD-10-CM | POA: Diagnosis not present

## 2022-11-23 DIAGNOSIS — H26492 Other secondary cataract, left eye: Secondary | ICD-10-CM | POA: Diagnosis not present

## 2022-11-23 DIAGNOSIS — H353131 Nonexudative age-related macular degeneration, bilateral, early dry stage: Secondary | ICD-10-CM | POA: Diagnosis not present

## 2022-11-27 ENCOUNTER — Other Ambulatory Visit: Payer: Self-pay | Admitting: Internal Medicine

## 2022-11-27 ENCOUNTER — Other Ambulatory Visit: Payer: Self-pay

## 2022-12-14 IMAGING — CT CT HEART MORP W/ CTA COR W/ SCORE W/ CA W/CM &/OR W/O CM
4 of 7 series · 8 of 20 positions shown, 9 images · non-contrast
Comparison: None.
COMPARISON: None.

Addendum:
EXAM:
OVER-READ INTERPRETATION  CT CHEST

The following report is an over-read performed by radiologist Dr.
Kdjidskf Mutso [REDACTED] on 03/17/2021. This
over-read does not include interpretation of cardiac or coronary
anatomy or pathology. The coronary calcium score/coronary CTA
interpretation by the cardiologist is attached.
CLINICAL DATA: 75 Year old White Male
Cardiac/Coronary  CTA
TECHNIQUE: The patient was scanned on a [employer] scanner.

[Series 6: ts diast sharp · axial · 0.39mm/px · z∈[+1284,+1323]mm · 2 of 296 slices shown]
[im 99/296  lung]
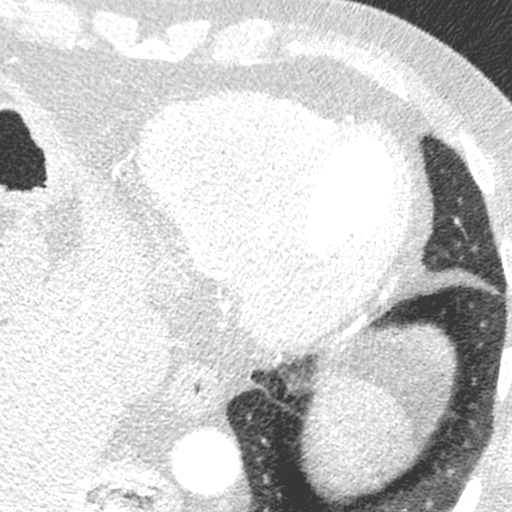
[im 197/296  lung]
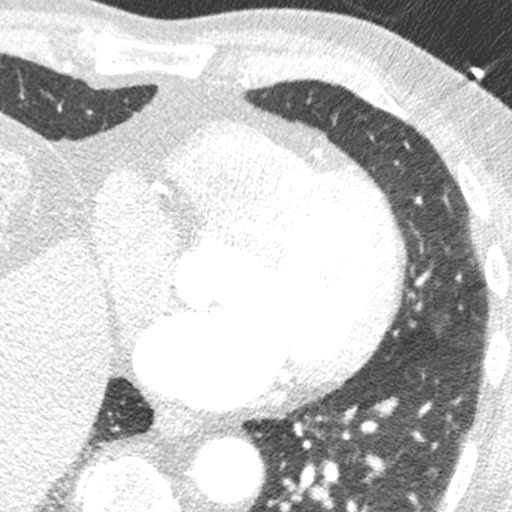

[Series 7: ts syst sharp · axial · 0.39mm/px · z∈[+1284,+1323]mm · 2 of 296 slices shown]
[im 99/296  lung]
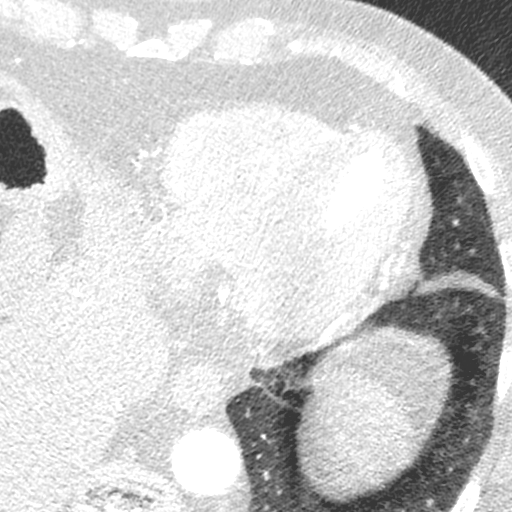
[im 197/296  lung]
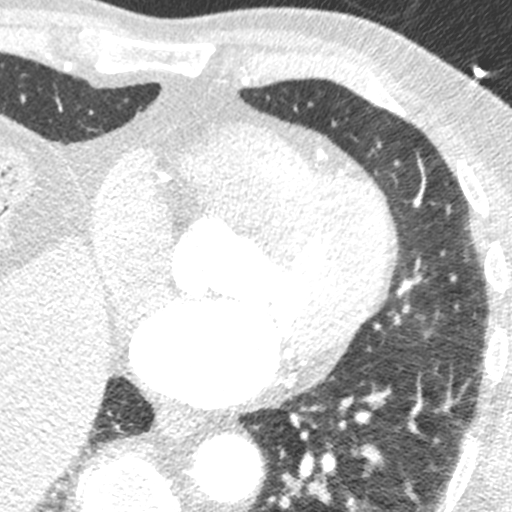

[Series 8: best syst · axial · 0.39mm/px · z∈[+1284,+1323]mm · 2 of 296 slices shown, 3 images]
[im 99/296  vessel]
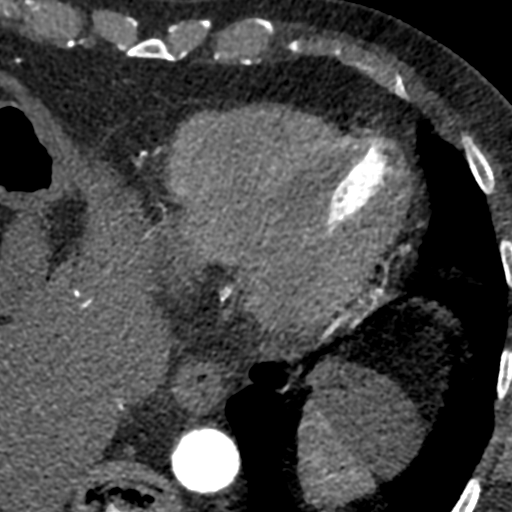
[im 99/296  lung]
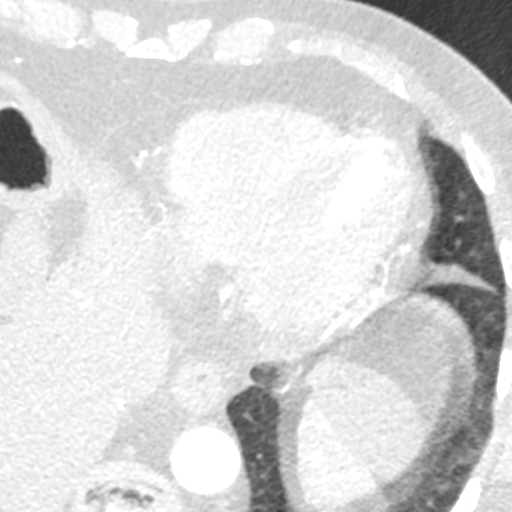
[im 197/296  vessel]
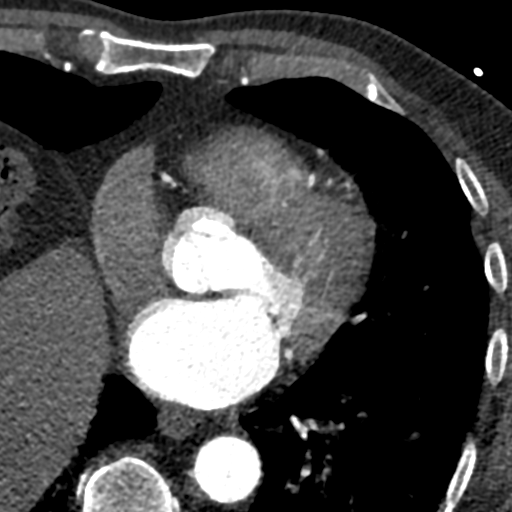

[Series 9: best diast · axial · 0.39mm/px · z∈[+1284,+1323]mm · 2 of 296 slices shown]
[im 99/296  vessel]
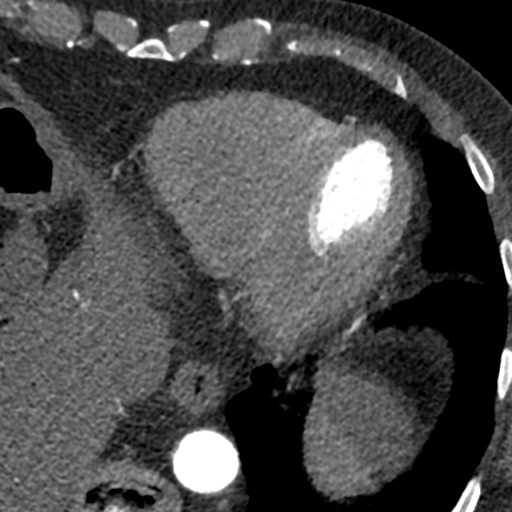
[im 197/296  vessel]
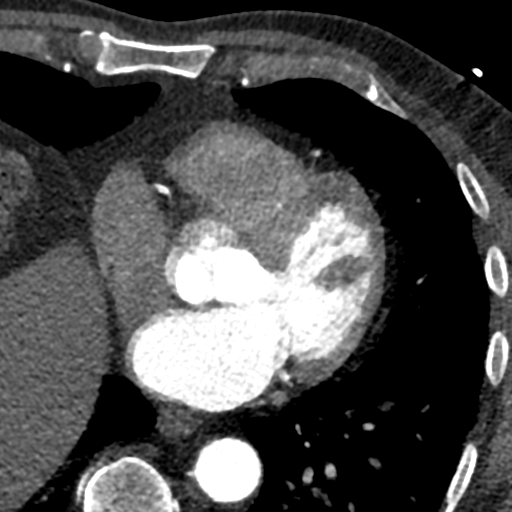

[8 of 20 positions shown; findings below may reference images not displayed]

FINDINGS: Aortic atherosclerosis. Markedly elevated right hemidiaphragm.
Within the visualized portions of the thorax there are no suspicious
appearing pulmonary nodules or masses, there is no acute
consolidative airspace disease, no pleural effusions, no
pneumothorax and no lymphadenopathy. Visualized portions of the
upper abdomen are unremarkable. There are no aggressive appearing
lytic or blastic lesions noted in the visualized portions of the
skeleton.
IMPRESSION: 1.  Aortic Atherosclerosis (NFL2C-9M8.8).
2. Marked elevation of the right hemidiaphragm, suggestive of right
hemidiaphragmatic paralysis.
FINDINGS: Scan was triggered in the descending thoracic aorta. Axial
non-contrast 3 mm slices were carried out through the heart. The
data set was analyzed on a dedicated work station and scored using
the Agatson method. Gantry rotation speed was 250 msecs and
collimation was .6 mm. 0.8 mg of sl NTG was given. The 3D data set
was reconstructed in 5% intervals of the 67-82 % of the R-R cycle.
Diastolic phases were analyzed on a dedicated work station using
MPR, MIP and VRT modes. The patient received 95 cc of contrast.

Aorta:  Normal size.  Aortic atherosclerosis.  No dissection.

Main Pulmonary Artery: Normal size of the pulmonary artery.

Aortic Valve: Tri-leaflet. Aortic valve calcification with calcium
score of 707.

Coronary Arteries:  Normal coronary origin.  Left dominance.

Coronary Calcium Score:

Left main: 291

Left anterior descending artery: 176

Left circumflex artery: 330

Right coronary artery: 69

Ramus: 22

Total: 888

Percentile: 74th for age, sex, and race matched control.

RCA is a non-dominant artery that gives rise to PLA. There are mild
non obstructive (25-49%) calcified plaques in the proximal and mid
RCA. There is a cardiac motion stair step artifact in the mid RCA.

Left main is a large artery that gives rise to LAD, Ramus
Intermedius, & LCX arteries. There is 20% calcified lesion through
the body and distal vessel.

LAD is a large vessel that gives rise to one two diagonal vessels.
There is a mild non-obstructive calcified plaque in the proximal
LAD, mid LAD, & proximal D2. There is a cardiac motion stair step
artifact in the distal LAD.

LCX is a non-dominant artery that gives rise to one large OM1 branch
and a PDA. There are minimal and mild calcified lesions in the
proximal LCX. There is a mild calcified non obstructive lesion in
the body of OM1. There is a mild non obstructive calcified lesion in
the distal LCX. There is a cardiac motion stair step artifact at the
LPDA take off.

RI is a small vessel with a calcified plaque in the body.

Other findings:

Normal pulmonary vein drainage into the left atrium.

Normal left atrial appendage without a thrombus.

Extra-cardiac findings: See attached radiology report for
non-cardiac structures.
IMPRESSION: 1. Coronary calcium score of 888. This was 74th percentile for age,
sex, and race matched control.

2. Normal coronary origin with left dominance.

3. Aortic atherosclerosis.

4. Aortic valve calcification with calcium score of 707.

5. CAD-RADS 2. Mild non-obstructive CAD (25-49%). Consider
non-atherosclerotic causes of chest pain. Consider preventive
therapy and risk factor modification. Sensitivity decreased in the
setting of artifact; CT-FFR would not be available.

RECOMMENDATIONS:



If CAC = 0, it is reasonable to withhold statin therapy and reassess
in 5 to 10 years, as long as higher risk conditions are absent
(diabetes mellitus, family history of premature CHD in first degree
relatives (males <55 years; females <65 years), cigarette smoking,
LDL >=190 mg/dL or other independent risk factors).

If CAC is 1 to 99, it is reasonable to initiate statin therapy for
patients >=55 years of age.

If CAC is >=100 or >=75th percentile, it is reasonable to initiate
statin therapy at any age.

Cardiology referral should be considered for patients with CAC
scores =400 or >=75th percentile.

*5866 AHA/ACC/AACVPR/AAPA/ABC/JOELSKIE/JIM/SHOBHIT/[place]/KAKI/ZADI/PAULUS N
Guideline on the Management of Blood Cholesterol: A Report of the
American College of Cardiology/American Heart Association Task Force
on Clinical Practice Guidelines. J Am Coll Cardiol.
7914;73(24):2671-2643.

*** End of Addendum ***
EXAM:
OVER-READ INTERPRETATION  CT CHEST

The following report is an over-read performed by radiologist Dr.
Kdjidskf Mutso [REDACTED] on 03/17/2021. This
over-read does not include interpretation of cardiac or coronary
anatomy or pathology. The coronary calcium score/coronary CTA
interpretation by the cardiologist is attached.
FINDINGS: Aortic atherosclerosis. Markedly elevated right hemidiaphragm.
Within the visualized portions of the thorax there are no suspicious
appearing pulmonary nodules or masses, there is no acute
consolidative airspace disease, no pleural effusions, no
pneumothorax and no lymphadenopathy. Visualized portions of the
upper abdomen are unremarkable. There are no aggressive appearing
lytic or blastic lesions noted in the visualized portions of the
skeleton.
IMPRESSION: 1.  Aortic Atherosclerosis (NFL2C-9M8.8).
2. Marked elevation of the right hemidiaphragm, suggestive of right
hemidiaphragmatic paralysis.

## 2023-01-11 ENCOUNTER — Ambulatory Visit
Admission: RE | Admit: 2023-01-11 | Discharge: 2023-01-11 | Disposition: A | Discharge status: Home / Self Care | Payer: Medicare Other | Source: Ambulatory Visit | Attending: Family Medicine | Admitting: Family Medicine

## 2023-01-11 VITALS — BP 135/86 | HR 69 | Temp 98.0°F | Resp 16

## 2023-01-11 DIAGNOSIS — R350 Frequency of micturition: Secondary | ICD-10-CM | POA: Insufficient documentation

## 2023-01-11 LAB — POCT URINALYSIS DIP (MANUAL ENTRY)
Bilirubin, UA: NEGATIVE
Blood, UA: NEGATIVE
Glucose, UA: NEGATIVE mg/dL
Ketones, POC UA: NEGATIVE mg/dL
Leukocytes, UA: NEGATIVE
Nitrite, UA: NEGATIVE
Protein Ur, POC: NEGATIVE mg/dL
Spec Grav, UA: 1.015 (ref 1.010–1.025)
Urobilinogen, UA: 0.2 U/dL
pH, UA: 6 (ref 5.0–8.0)

## 2023-01-11 MED ORDER — CIPROFLOXACIN HCL 500 MG PO TABS: 500.0000 mg | ORAL_TABLET | Freq: Two times a day (BID) | ORAL | 0 refills | Status: AC

## 2023-01-11 NOTE — Discharge Instructions (Addendum)
You were seen today for urinary symptoms.  Your urine actually looked okay today.  However, based on your symptoms I am treating you with an antibiotic x 5 days.  We will culture your urine as well.  If you have continued or worsening symptoms then please return for further evaluation.

## 2023-01-11 NOTE — ED Provider Notes (Addendum)
[facility]    CSN: [id number] Arrival date & time: 01/11/23  1043      History   Chief Complaint Chief Complaint  Patient presents with   Urinary Frequency    HPI Troy [name] is a 76 y.o. male.    Urinary Frequency  Patient is here for 2 days of urinary pain, frequency, and bladder pressure.  He did have incontinence over the weekend.  He over all feels better, but did take azo an increased water intake a lot.  He has never had a UTI in his life.  No fevers/chills.  No n/v.        Past Medical History:  Diagnosis Date   ALLERGIC RHINITIS 05/04/2007   BACK PAIN, LUMBAR 02/17/2010   CHEST PAIN 07/15/2009   CONSTIPATION 02/17/2010   Heart murmur    remote hx of murmur   HYPERLIPIDEMIA 05/04/2007   HYPERTENSION 05/04/2007   Interstitial cystitis 08/30/2013   Lumbar disc disease 10/01/2011   NECK PAIN, ACUTE 07/15/2009   Prostate enlargement    PSVT 05/04/2007   Skin cancer 08/30/2013   Basal cell x 2, dec 2014   Spermatocele    VERTIGO 05/04/2007    Patient Active Problem List   Diagnosis Date Noted   Chronic sinusitis 09/28/2022   Macular degeneration 09/25/2022   Right bundle branch block 03/03/2021   Vitamin D deficiency 08/01/2020   B12 deficiency 08/01/2020   Tick bite, infected 08/11/2018   Hyperglycemia 09/30/2017   Chronic bucket handle tear of medial meniscus of right knee 05/18/2017   Interstitial cystitis 08/30/2013   Skin cancer 08/30/2013   Varicocele 10/04/2011   Lumbar disc disease 10/01/2011   Renal cyst 03/25/2011   Preventative health care 10/22/2010   Cervical spondylosis 10/22/2010   Bladder neck obstruction 10/22/2010   BACK PAIN, LUMBAR 02/17/2010   Chest pain of uncertain etiology 07/15/2009   Hyperlipidemia 05/04/2007   Essential hypertension 05/04/2007   PSVT 05/04/2007   Allergic rhinitis 05/04/2007    Past Surgical History:  Procedure Laterality Date   APPENDECTOMY     KNEE ARTHROSCOPY  30 yrs ago    s/p dental implant left upper jaw  11/09   also rt lower jaw   s/p RK surgery bilat     SPERMATOCELECTOMY  12/04/2011   Procedure: SPERMATOCELECTOMY;  Surgeon: Lindaann Slough, MD;  Location: [facility] [place];  Service: Urology;  Laterality: Left;  1 hour requested for this case    TONSILLECTOMY         Home Medications    Prior to Admission medications   Medication Sig Start Date End Date Taking? Authorizing Provider  aspirin 81 MG EC tablet Take 81 mg by mouth daily.    [provider]  atenolol (TENORMIN) 25 MG tablet Take 2 tablets by mouth once daily 11/27/22   Corwin Levins, MD  atorvastatin (LIPITOR) 80 MG tablet Take 0.5 tablets (40 mg total) by mouth daily. 06/19/22   Pincus Sanes, MD  bismuth subsalicylate (RA STOMACH RELIEF) 262 MG/15ML suspension Take by mouth. 09/30/17   [provider]  ezetimibe (ZETIA) 10 MG tablet Take 1 tablet (10 mg total) by mouth daily. 06/19/22   Pincus Sanes, MD  famotidine-calcium carbonate-magnesium hydroxide (PEPCID COMPLETE) 10-800-165 MG chewable tablet Chew 1 tablet by mouth daily as needed.    [provider]  fluticasone (FLONASE) 50 MCG/ACT nasal spray 2 sprays. 09/30/17   [provider]  losartan (COZAAR) 100 MG tablet  Take 1 tablet (100 mg total) by mouth daily. 09/25/22   Corwin Levins, MD  metoprolol succinate (TOPROL-XL) 50 MG 24 hr tablet Take by mouth. 05/11/18   [provider]  pantoprazole (PROTONIX) 40 MG tablet Take 1 tablet (40 mg total) by mouth 2 (two) times daily before a meal. 05/19/22   Burns, Bobette Mo, MD  Probiotic Product (PROBIOTIC PO) Take by mouth daily.    [provider]  pseudoephedrine (SUDAFED) 30 MG tablet Take 30 mg by mouth every 4 (four) hours as needed for congestion.    [provider]  traMADol (ULTRAM) 50 MG tablet TAKE 1 TABLET BY MOUTH EVERY 6 HOURS AS NEEDED 10/27/22   Corwin Levins, MD    Family History Family History  Problem  Relation Age of Onset   Cancer Mother        breast cancer   Dementia Mother    Hypertension Mother    Hyperlipidemia Other    Diabetes Other    Heart disease Other     Social History Social History   Tobacco Use   Smoking status: Never   Smokeless tobacco: Never  Vaping Use   Vaping status: Never Used  Substance Use Topics   Alcohol use: No    Alcohol/week: 0.0 standard drinks of alcohol   Drug use: No     Allergies   Patient has no known allergies.   Review of Systems Review of Systems  Constitutional: Negative.   HENT: Negative.    Respiratory: Negative.    Cardiovascular: Negative.   Gastrointestinal: Negative.   Genitourinary:  Positive for frequency and urgency.  Musculoskeletal: Negative.      Physical Exam Triage Vital Signs ED Triage Vitals  Encounter Vitals Group     BP 01/11/23 1122 135/86     Systolic BP Percentile --      Diastolic BP Percentile --      Pulse Rate 01/11/23 1122 69     Resp 01/11/23 1122 16     Temp 01/11/23 1122 98 F (36.7 C)     Temp Source 01/11/23 1122 Oral     SpO2 01/11/23 1122 94 %     Weight --      Height --      Head Circumference --      Peak Flow --      Pain Score 01/11/23 1119 0     Pain Loc --      Pain Education --      Exclude from Growth Chart --    No data found.  Updated Vital Signs BP 135/86 (BP Location: Left Arm)   Pulse 69   Temp 98 F (36.7 C) (Oral)   Resp 16   SpO2 94%   Visual Acuity Right Eye Distance:   Left Eye Distance:   Bilateral Distance:    Right Eye Near:   Left Eye Near:    Bilateral Near:     Physical Exam Constitutional:      Appearance: Normal appearance.  Cardiovascular:     Rate and Rhythm: Normal rate and regular rhythm.  Pulmonary:     Effort: Pulmonary effort is normal.     Breath sounds: Normal breath sounds.  Abdominal:     Palpations: Abdomen is soft.     Tenderness: There is no abdominal tenderness. There is no guarding.  Neurological:      General: No focal deficit present.     Mental Status: He is alert.  Psychiatric:        Mood and Affect: Mood normal.      UC Treatments / Results  Labs (all labs ordered are listed, but only abnormal results are displayed) Labs Reviewed  URINE CULTURE  POCT URINALYSIS DIP (MANUAL ENTRY)    EKG   Radiology No results found.  Procedures Procedures (including critical care time)  Medications Ordered in UC Medications - No data to display  Initial Impression / Assessment and Plan / UC Course  I have reviewed the triage vital signs and the nursing notes.  Pertinent labs & imaging results that were available during my care of the patient were reviewed by me and considered in my medical decision making (see chart for details).   Final Clinical Impressions(s) / UC Diagnoses   Final diagnoses:  Urinary frequency     Discharge Instructions      You were seen today for urinary symptoms.  Your urine actually looked okay today.  However, based on your symptoms I am treating you with an antibiotic x 5 days.  We will culture your urine as well.  If you have continued or worsening symptoms then please return for further evaluation.     ED Prescriptions     Medication Sig Dispense Auth. Provider   ciprofloxacin (CIPRO) 500 MG tablet Take 1 tablet (500 mg total) by mouth every 12 (twelve) hours for 5 days. 10 tablet Jannifer Franklin, MD      PDMP not reviewed this encounter.   Jannifer Franklin, MD 01/11/23 1149    Jannifer Franklin, MD 01/11/23 1153

## 2023-01-11 NOTE — ED Triage Notes (Signed)
Patient presents to [facility] for UTI. States Saturday he developed urinary freq, urgency, and bladder pressure. States today he feels better, took ibuprofen, tramadol, and AZO.   Denies hematuria, fever.

## 2023-01-12 LAB — URINE CULTURE: Culture: NO GROWTH

## 2023-01-18 DIAGNOSIS — H26491 Other secondary cataract, right eye: Secondary | ICD-10-CM | POA: Diagnosis not present

## 2023-01-27 ENCOUNTER — Other Ambulatory Visit: Payer: Self-pay | Admitting: Internal Medicine

## 2023-02-02 DIAGNOSIS — Z23 Encounter for immunization: Secondary | ICD-10-CM | POA: Diagnosis not present

## 2023-02-19 ENCOUNTER — Other Ambulatory Visit: Payer: Self-pay

## 2023-02-19 ENCOUNTER — Other Ambulatory Visit: Payer: Self-pay | Admitting: Internal Medicine

## 2023-03-01 DIAGNOSIS — M545 Low back pain, unspecified: Secondary | ICD-10-CM | POA: Diagnosis not present

## 2023-03-01 DIAGNOSIS — N401 Enlarged prostate with lower urinary tract symptoms: Secondary | ICD-10-CM | POA: Diagnosis not present

## 2023-03-01 DIAGNOSIS — R351 Nocturia: Secondary | ICD-10-CM | POA: Diagnosis not present

## 2023-03-01 DIAGNOSIS — N50812 Left testicular pain: Secondary | ICD-10-CM | POA: Diagnosis not present

## 2023-04-06 DIAGNOSIS — M25512 Pain in left shoulder: Secondary | ICD-10-CM | POA: Diagnosis not present

## 2023-04-09 NOTE — Progress Notes (Unsigned)
Cardiology Office Note:   Date:  04/09/2023  ID:  Troy [name], DOB [birth year], MRN [id number] PCP:  Corwin Levins, MD  [facility] Providers Cardiologist:  Alverda Skeans, MD Referring MD: Corwin Levins, MD  Chief Complaint/Reason for Referral: Cardiology follow-up ASSESSMENT:    1. Coronary artery disease involving native coronary artery of native heart without angina pectoris   2. Aortic atherosclerosis (HCC)   3. Hyperlipidemia LDL goal <70   4. Essential hypertension   5. Nonrheumatic aortic valve stenosis   6. CKD (chronic kidney disease) stage 2, GFR 60-89 ml/min   7. BMI 27.0-27.9,adult     PLAN:   In order of problems listed above: Coronary artery disease: Mild; continue aspirin and atorvastatin 80 mg. Aortic atherosclerosis: Continue aspirin, atorvastatin, and strict blood pressure control. Hyperlipidemia: Check lipid panel, LFTs, LP(a) today. Hypertension:*** Aortic stenosis: Mild on most recent echocardiogram.*** CKD stage II: Continue losartan and strict blood pressure control. Elevated BMI: Hemoglobin A1c was not in the diabetic range; continue diet and exercise modification.        {Are you ordering a CV Procedure (e.g. stress test, cath, DCCV, TEE, etc)?   Press F2        :[id number]}   Dispo:  No follow-ups on file.      Medication Adjustments/Labs and Tests Ordered: Current medicines are reviewed at length with the patient today.  Concerns regarding medicines are outlined above.  The following changes have been made:  {PLAN; NO CHANGE:13088:s}   Labs/tests ordered: No orders of the defined types were placed in this encounter.   Medication Changes: No orders of the defined types were placed in this encounter.   Current medicines are reviewed at length with the patient today.  The patient {ACTIONS; HAS/DOES NOT HAVE:19233} concerns regarding medicines.  I spent *** minutes in the care of Troy [name] today including {CHL AMB CAR Time Based  Billing Options STW (Optional):[phone]::"documenting in the encounter."}  History of Present Illness:      FOCUSED PROBLEM LIST:   Coronary artery disease Nonobstructive coronary CTA 2022 Aortic atherosclerosis Coronary CTA 2022 Right bundle branch block Hyperlipidemia Hypertension Aortic stenosis MG 10, EF 55 to 60% TTE 2024 CKD stage II BMI 30 March 2021 consultation: Troy [name] is a 77 y.o. male with the indicated medical problems here for recommendations regarding chest pain.  The patient was recently seen in his primary care provider's office due to chest pain.  The patient had been working out and believes he pulled a muscle.  An EKG was done which demonstrated a right bundle branch block.  Review of his previous EKGs demonstrate no right bundle branch block.  He was referred urgently to cardiology for an evaluation.   He describes I upper chest tightness that is worse after he eats.  It is actually better when he exerts himself.  He took a Pepcid on the advice of his daughter and this seemed to help especially prior to eating.  He is relatively active and getting in shape for reunion.  He denies any palpitations, paroxysmal atrial dyspnea, orthopnea, peripheral edema, presyncope, syncope, signs or symptoms of stroke, or severe bleeding.  He has required no emergency room visits or hospitalizations.  Plan: Obtain coronary CTA.   October 2023: The patient presents for routine follow-up.  His coronary CTA done last year was reassuring with only mild obstructive disease.  He has been doing very well.  He has had a little bit of dyspnea  getting over a URI.  He has had no exertional chest pain.  He has had no myalgias or arthralgias on atorvastatin.  He denies any exertional angina, presyncope, syncope, severe bleeding or bruising, or any need for hospitalizations or emergency room visits.  He is otherwise well without complaints.  Plan: Increase atorvastatin to 80 mg, check lipid  panel, LFTs, LP(a) in 2 months; obtain echocardiogram to evaluate aortic valve calcification on coronary CTA.  December 2024: In the interim the patient had an echocardiogram which demonstrated only mild aortic valve stenosis.  His LDL in April 2024 was 46 and a LP(a) was not measured.          Current Medications: No outpatient medications have been marked as taking for the 04/13/23 encounter (Appointment) with Troy Pyo, MD.     Review of Systems:   Please see the history of present illness.    All other systems reviewed and are negative.     EKGs/Labs/Other Test Reviewed:   EKG: EKG from 2023 demonstrates sinus rhythm with right bundle branch block  EKG Interpretation Date/Time:    Ventricular Rate:    PR Interval:    QRS Duration:    QT Interval:    QTC Calculation:   R Axis:      Text Interpretation:           Risk Assessment/Calculations:   {Does this patient have ATRIAL FIBRILLATION?[phone]}      Physical Exam:   VS:  There were no vitals taken for this visit.   No BP recorded.  {Refresh Note OR Click here to enter BP  :1}***   Wt Readings from Last 3 Encounters:  09/25/22 215 lb (97.5 kg)  06/19/22 212 lb (96.2 kg)  05/19/22 209 lb (94.8 kg)      GENERAL:  No apparent distress, AOx3 HEENT:  No carotid bruits, +2 carotid impulses, no scleral icterus CAR: RRR Irregular RR*** no murmurs***, gallops, rubs, or thrills RES:  Clear to auscultation bilaterally ABD:  Soft, nontender, nondistended, positive bowel sounds x 4 VASC:  +2 radial pulses, +2 carotid pulses NEURO:  CN 2-12 grossly intact; motor and sensory grossly intact PSYCH:  No active depression or anxiety EXT:  No edema, ecchymosis, or cyanosis  Signed, Troy Pyo, MD  04/09/2023 1:04 PM    [facility] [address] [place], [place], [place]  [address] Phone: [phone]; Fax: [phone]   Note:  This document was prepared using Dragon voice recognition  software and may include unintentional dictation errors.

## 2023-04-13 ENCOUNTER — Encounter: Payer: Self-pay | Admitting: Internal Medicine

## 2023-04-13 ENCOUNTER — Ambulatory Visit: Payer: Medicare Other | Attending: Internal Medicine | Admitting: Internal Medicine

## 2023-04-13 ENCOUNTER — Other Ambulatory Visit: Payer: Self-pay | Admitting: *Deleted

## 2023-04-13 ENCOUNTER — Other Ambulatory Visit: Payer: Self-pay

## 2023-04-13 VITALS — BP 160/80 | HR 65 | Ht 74.5 in | Wt 218.0 lb

## 2023-04-13 DIAGNOSIS — N182 Chronic kidney disease, stage 2 (mild): Secondary | ICD-10-CM | POA: Diagnosis not present

## 2023-04-13 DIAGNOSIS — I350 Nonrheumatic aortic (valve) stenosis: Secondary | ICD-10-CM | POA: Insufficient documentation

## 2023-04-13 DIAGNOSIS — I700 Atherosclerosis of aorta: Secondary | ICD-10-CM | POA: Diagnosis not present

## 2023-04-13 DIAGNOSIS — Z6827 Body mass index (BMI) 27.0-27.9, adult: Secondary | ICD-10-CM | POA: Diagnosis not present

## 2023-04-13 DIAGNOSIS — E785 Hyperlipidemia, unspecified: Secondary | ICD-10-CM

## 2023-04-13 DIAGNOSIS — I10 Essential (primary) hypertension: Secondary | ICD-10-CM | POA: Diagnosis not present

## 2023-04-13 DIAGNOSIS — I2510 Atherosclerotic heart disease of native coronary artery without angina pectoris: Secondary | ICD-10-CM | POA: Diagnosis not present

## 2023-04-13 MED ORDER — NEBIVOLOL HCL 2.5 MG PO TABS: 2.5000 mg | ORAL_TABLET | Freq: Every day | ORAL | 3 refills | Status: DC

## 2023-04-13 NOTE — Patient Instructions (Addendum)
Medication Instructions:  Your physician has recommended you make the following change in your medication:  1.) stop atenolol 2.) stop metoprolol (Toprol XL) 3.) start Bystolic (nebivolol) 2.5 mg - one tablet daily  *If you need a refill on your cardiac medications before your next appointment, please call your pharmacy*   Lab Work: Go to [facility] on first floor for blood work today: Lipids/liver function/Lpa  If you have labs (blood work) drawn today and your tests are completely normal, you will receive your results only by: [employer] (if you have MyChart) OR A paper copy in the mail If you have any lab test that is abnormal or we need to change your treatment, we will call you to review the results.   Testing/Procedures: none   Follow-Up: At [facility], you and your health needs are our priority.  As part of our continuing mission to provide you with exceptional heart care, we have created designated Provider Care Teams.  These Care Teams include your primary Cardiologist (physician) and Advanced Practice Providers (APPs -  Physician Assistants and Nurse Practitioners) who all work together to provide you with the care you need, when you need it.   Your next appointment:   6 month(s)  Provider:   Jari Favre, PA-C, Ronie Spies, PA-C, Robin Searing, NP, Jacolyn Reedy, PA-C, Eligha Bridegroom, NP, Tereso Newcomer, PA-C, or Perlie Gold, PA-C       Other Instructions You have been referred to clinical pharmacy team for blood pressure management

## 2023-04-15 LAB — LIPID PANEL
Chol/HDL Ratio: 2.6 {ratio} (ref 0.0–5.0)
Cholesterol, Total: 195 mg/dL (ref 100–199)
HDL: 74 mg/dL (ref >39)
LDL Chol Calc (NIH): 79 mg/dL (ref 0–99)
Triglycerides: 264 mg/dL — ABNORMAL HIGH (ref 0–149)
VLDL Cholesterol Cal: 42 mg/dL — ABNORMAL HIGH (ref 5–40)

## 2023-04-15 LAB — HEPATIC FUNCTION PANEL
ALT: 39 [IU]/L (ref 0–44)
AST: 22 [IU]/L (ref 0–40)
Albumin: 4.5 g/dL (ref 3.8–4.8)
Alkaline Phosphatase: 69 [IU]/L (ref 44–121)
Bilirubin Total: 0.5 mg/dL (ref 0.0–1.2)
Bilirubin, Direct: 0.2 mg/dL (ref 0.00–0.40)
Total Protein: 7 g/dL (ref 6.0–8.5)

## 2023-04-15 LAB — LIPOPROTEIN A (LPA): Lipoprotein (a): 15.1 nmol/L (ref <75.0)

## 2023-04-16 ENCOUNTER — Other Ambulatory Visit: Payer: Self-pay

## 2023-04-16 DIAGNOSIS — E785 Hyperlipidemia, unspecified: Secondary | ICD-10-CM

## 2023-04-16 DIAGNOSIS — Z79899 Other long term (current) drug therapy: Secondary | ICD-10-CM

## 2023-04-16 MED ORDER — EZETIMIBE 10 MG PO TABS: 10.0000 mg | ORAL_TABLET | Freq: Every day | ORAL | 3 refills | Status: DC

## 2023-04-16 NOTE — Progress Notes (Signed)
Per Dr. Lynnette Caffey: add Zetia 10 mg daily, check lipid panel and LFTs in 2 months.

## 2023-04-20 ENCOUNTER — Encounter: Payer: Self-pay | Admitting: Internal Medicine

## 2023-04-20 DIAGNOSIS — I10 Essential (primary) hypertension: Secondary | ICD-10-CM

## 2023-04-22 NOTE — Addendum Note (Signed)
Addended by: Lendon Ka on: 04/22/2023 11:48 AM   Modules accepted: Orders

## 2023-04-22 NOTE — Telephone Encounter (Signed)
Referral to HTN clinic placed.

## 2023-05-17 ENCOUNTER — Other Ambulatory Visit: Payer: Self-pay | Admitting: Internal Medicine

## 2023-05-25 ENCOUNTER — Ambulatory Visit: Payer: Medicare Other

## 2023-06-14 DIAGNOSIS — L738 Other specified follicular disorders: Secondary | ICD-10-CM | POA: Diagnosis not present

## 2023-06-14 DIAGNOSIS — L814 Other melanin hyperpigmentation: Secondary | ICD-10-CM | POA: Diagnosis not present

## 2023-06-14 DIAGNOSIS — D225 Melanocytic nevi of trunk: Secondary | ICD-10-CM | POA: Diagnosis not present

## 2023-06-14 DIAGNOSIS — L821 Other seborrheic keratosis: Secondary | ICD-10-CM | POA: Diagnosis not present

## 2023-06-14 DIAGNOSIS — D1801 Hemangioma of skin and subcutaneous tissue: Secondary | ICD-10-CM | POA: Diagnosis not present

## 2023-06-14 DIAGNOSIS — D2262 Melanocytic nevi of left upper limb, including shoulder: Secondary | ICD-10-CM | POA: Diagnosis not present

## 2023-06-14 DIAGNOSIS — Z85828 Personal history of other malignant neoplasm of skin: Secondary | ICD-10-CM | POA: Diagnosis not present

## 2023-06-14 DIAGNOSIS — L570 Actinic keratosis: Secondary | ICD-10-CM | POA: Diagnosis not present

## 2023-06-14 DIAGNOSIS — D2271 Melanocytic nevi of right lower limb, including hip: Secondary | ICD-10-CM | POA: Diagnosis not present

## 2023-06-21 ENCOUNTER — Other Ambulatory Visit: Payer: Self-pay | Admitting: Internal Medicine

## 2023-07-05 ENCOUNTER — Ambulatory Visit: Payer: Medicare Other | Attending: Cardiovascular Disease | Admitting: Pharmacist

## 2023-07-05 ENCOUNTER — Encounter: Payer: Self-pay | Admitting: Pharmacist

## 2023-07-05 VITALS — BP 160/110 | HR 84

## 2023-07-05 DIAGNOSIS — I10 Essential (primary) hypertension: Secondary | ICD-10-CM | POA: Insufficient documentation

## 2023-07-05 MED ORDER — LOSARTAN POTASSIUM-HCTZ 100-25 MG PO TABS: 1.0000 | ORAL_TABLET | Freq: Every day | ORAL | 3 refills | Status: DC

## 2023-07-05 MED ORDER — AMLODIPINE BESYLATE 5 MG PO TABS: 5.0000 mg | ORAL_TABLET | Freq: Every day | ORAL | 3 refills | Status: AC

## 2023-07-05 NOTE — Assessment & Plan Note (Signed)
 Assessment: In office BP 160/110 and HR 84.  Does not check BP regularly at home  Takes and tolerates losartan well without any side effects Denies SOB, palpitation, chest pain, headaches,or swelling Eats out a lot some time  process food, due to knee pain exercise capacity is limited  Reiterated the importance of regular exercise and low salt diet  Given stage 2 hypertension will add thiazide to ARB and CCB   Plan:  Stop taking losartan and start taking losartan hydrochlorothiazide 100/25 mg once a day and start taking amlodipine 5 mg daily  Will get baseline BMP today and will repeat in 2 weeks to assess electrolytes and renal function  Patient to keep record of BP readings with heart rate and report to [place] at the next visit Patient to cut back on processed food  Patient to bring home monitor for validation at next OV  Patient to see PharmD in 6  weeks for follow up

## 2023-07-05 NOTE — Progress Notes (Signed)
 Patient ID: Troy [name]                 DOB: [birth year]                      MRN: [id number]      HPI: Troy [name] is a 78 y.o. male referred by Dr. Lynnette Caffey to HTN clinic. PMH is significant for hypertension, RBBB, PSVT, CAD, HLD, stage 2 CKD non rheumatic aortic valve stenosis    At PCP An EKG was done which demonstrated a right bundle branch block Nov 2022 pt was seen for chest pain at heart care, coronary CTA- mild obstruction at Oct 2023 f/u Lipitor dose were optimized and echo was ordered to evaluate aortic valve calcification on coronary CTA. At Dec 2024 visit with Dr Lynnette Caffey BP was elevated so atenolol was switched to Bystolic 2.5 mg daily. Medication went on back order. Patient were asked to continue only losartan 100 mg daily.  Patient presented today for hypertension clinic. Reports he does not check his BP at home. His daughter is doctor and he visits her once a week at [place] she checks his BP and it runs in 140/80 range. He does not add salt to home cooked meals but he eats out a lot. His wife has cancer and he is the one who fixes meals. He has not been taking only losartan 100 mg from last few weeks. He takes sudafed multiple pills every day claims Flonase dose not work for perennial allergies. He takes ibuprofen 200 mg - 5 to 6 pills per day. He use to take 12 pills per day for pain. Lately from PCP advise he has cut back on ibuprofen. Suggest to get Voltaren topical gel to reduce system exposure and reduce oral NSAID to only PRN use.    Current HTN meds: losartan 100 mg daily  Previously tried: Arts development officer - due to recall  BP goal: <130/80   Family History:  Relation Problem Comments  Mother Cancer breast cancer  Dementia   Hypertension       Social History:  Alcohol: none  Smoking: none  Recreational drug use:one   Diet: low salt and low sugar  Exercise:  knee arthritis limits. Golf every week.  Fishing - stand and walks a lot    Home BP readings: once a  week ~140/80 recalls from memory    Wt Readings from Last 3 Encounters:  04/13/23 218 lb (98.9 kg)  09/25/22 215 lb (97.5 kg)  06/19/22 212 lb (96.2 kg)   BP Readings from Last 3 Encounters:  07/05/23 (!) 160/110  04/13/23 (!) 160/80  01/11/23 135/86   Pulse Readings from Last 3 Encounters:  07/05/23 84  04/13/23 65  01/11/23 69    Renal function: CrCl cannot be calculated (Patient's most recent lab result is older than the maximum 21 days allowed.).  Past Medical History:  Diagnosis Date   ALLERGIC RHINITIS 05/04/2007   BACK PAIN, LUMBAR 02/17/2010   CHEST PAIN 07/15/2009   CONSTIPATION 02/17/2010   Heart murmur    remote hx of murmur   HYPERLIPIDEMIA 05/04/2007   HYPERTENSION 05/04/2007   Interstitial cystitis 08/30/2013   Lumbar disc disease 10/01/2011   NECK PAIN, ACUTE 07/15/2009   Prostate enlargement    PSVT 05/04/2007   Skin cancer 08/30/2013   Basal cell x 2, dec 2014   Spermatocele    VERTIGO 05/04/2007    Current Outpatient Medications on File Prior to Visit  Medication Sig Dispense Refill   aspirin 81 MG EC tablet Take 81 mg by mouth daily.     atorvastatin (LIPITOR) 80 MG tablet Take 0.5 tablets (40 mg total) by mouth daily. 90 tablet 3   bismuth subsalicylate (RA STOMACH RELIEF) 262 MG/15ML suspension Take by mouth.     ezetimibe (ZETIA) 10 MG tablet Take 1 tablet (10 mg total) by mouth daily. 90 tablet 3   famotidine-calcium carbonate-magnesium hydroxide (PEPCID COMPLETE) 10-800-165 MG chewable tablet Chew 1 tablet by mouth daily as needed.     fluticasone (FLONASE) 50 MCG/ACT nasal spray 2 sprays.     pantoprazole (PROTONIX) 40 MG tablet Take 1 tablet (40 mg total) by mouth 2 (two) times daily before a meal. 180 tablet 3   Probiotic Product (PROBIOTIC PO) Take by mouth daily.     traMADol (ULTRAM) 50 MG tablet TAKE 1 TABLET BY MOUTH EVERY 6 HOURS AS NEEDED 60 tablet 2   No current facility-administered medications on file prior to visit.    No  Known Allergies  Blood pressure (!) 160/110, pulse 84, SpO2 98%.   Assessment/Plan:  1. Hypertension -  Essential hypertension Assessment: In office BP 160/110 and HR 84.  Does not check BP regularly at home  Takes and tolerates losartan well without any side effects Denies SOB, palpitation, chest pain, headaches,or swelling Eats out a lot some time  process food, due to knee pain exercise capacity is limited  Reiterated the importance of regular exercise and low salt diet  Given stage 2 hypertension will add thiazide to ARB and CCB   Plan:  Stop taking losartan and start taking losartan hydrochlorothiazide 100/25 mg once a day and start taking amlodipine 5 mg daily  Will get baseline BMP today and will repeat in 2 weeks to assess electrolytes and renal function  Patient to keep record of BP readings with heart rate and report to [place] at the next visit Patient to cut back on processed food  Patient to bring home monitor for validation at next OV  Patient to see PharmD in 6  weeks for follow up     Thank you  Carmela Hurt, Pharm.D [facility] A Division of [facility] [address], [place], [place] [address]  Phone: [phone]; Fax: [phone]

## 2023-07-05 NOTE — Patient Instructions (Addendum)
 Changes made by your pharmacist Carmela Hurt, PharmD at today's visit:    Instructions/Changes  (what do you need to do) Your Notes  (what you did and when you did it)  Stop taking losartan and start taking losartan hydrochlorothiazide 100/25 mg once a day and start taking amlodipine 5 mg daily    Lower salt intake    Get the lab done today  will repeat in 2 weeks     Bring all of your meds, your BP cuff and your record of home blood pressures to your next appointment.    HOW TO TAKE YOUR BLOOD PRESSURE AT HOME  Rest 5 minutes before taking your blood pressure.  Don't smoke or drink caffeinated beverages for at least 30 minutes before. Take your blood pressure before (not after) you eat. Sit comfortably with your back supported and both feet on the floor (don't cross your legs). Elevate your arm to heart level on a table or a desk. Use the proper sized cuff. It should fit smoothly and snugly around your bare upper arm. There should be enough room to slip a fingertip under the cuff. The bottom edge of the cuff should be 1 inch above the crease of the elbow. Ideally, take 3 measurements at one sitting and record the average.  Important lifestyle changes to control high blood pressure  Intervention  Effect on the BP  Lose extra pounds and watch your waistline Weight loss is one of the most effective lifestyle changes for controlling blood pressure. If you're overweight or obese, losing even a small amount of weight can help reduce blood pressure. Blood pressure might go down by about 1 millimeter of mercury (mm Hg) with each kilogram (about 2.2 pounds) of weight lost.  Exercise regularly As a general goal, aim for at least 30 minutes of moderate physical activity every day. Regular physical activity can lower high blood pressure by about 5 to 8 mm Hg.  Eat a healthy diet Eating a diet rich in whole grains, fruits, vegetables, and low-fat dairy products and low in saturated fat and  cholesterol. A healthy diet can lower high blood pressure by up to 11 mm Hg.  Reduce salt (sodium) in your diet Even a small reduction of sodium in the diet can improve heart health and reduce high blood pressure by about 5 to 6 mm Hg.  Limit alcohol One drink equals 12 ounces of beer, 5 ounces of wine, or 1.5 ounces of 80-proof liquor.  Limiting alcohol to less than one drink a day for women or two drinks a day for men can help lower blood pressure by about 4 mm Hg.   If you have any questions or concerns please use My Chart to send questions or call the office at [phone]

## 2023-07-06 LAB — BASIC METABOLIC PANEL
BUN/Creatinine Ratio: 25 — ABNORMAL HIGH (ref 10–24)
BUN: 21 mg/dL (ref 8–27)
CO2: 23 mmol/L (ref 20–29)
Calcium: 9.2 mg/dL (ref 8.6–10.2)
Chloride: 105 mmol/L (ref 96–106)
Creatinine, Ser: 0.84 mg/dL (ref 0.76–1.27)
Glucose: 89 mg/dL (ref 70–99)
Potassium: 4.4 mmol/L (ref 3.5–5.2)
Sodium: 142 mmol/L (ref 134–144)
eGFR: 89 mL/min/{1.73_m2} (ref >59)

## 2023-07-07 ENCOUNTER — Encounter: Payer: Self-pay | Admitting: Pharmacist

## 2023-08-12 ENCOUNTER — Ambulatory Visit: Admitting: Pharmacist

## 2023-09-14 DIAGNOSIS — H179 Unspecified corneal scar and opacity: Secondary | ICD-10-CM | POA: Diagnosis not present

## 2023-09-14 DIAGNOSIS — D3131 Benign neoplasm of right choroid: Secondary | ICD-10-CM | POA: Diagnosis not present

## 2023-09-14 DIAGNOSIS — H353132 Nonexudative age-related macular degeneration, bilateral, intermediate dry stage: Secondary | ICD-10-CM | POA: Diagnosis not present

## 2023-09-14 DIAGNOSIS — H04123 Dry eye syndrome of bilateral lacrimal glands: Secondary | ICD-10-CM | POA: Diagnosis not present

## 2023-09-19 NOTE — Progress Notes (Signed)
 Patient ID: Troy [name]                 DOB: May 08, 1945                      MRN: [id number]      HPI: MANCIL PFENNING is a 78 y.o. male referred by Dr. Lorie Rook to HTN clinic. PMH is significant for hypertension, RBBB, PSVT, CAD, HLD, stage 2 CKD non rheumatic aortic valve stenosis    At PCP An EKG was done which demonstrated a right bundle branch block Nov 2022 pt was seen for chest pain at heart care, coronary CTA- mild obstruction at Oct 2023 f/u Lipitor dose were optimized and echo was ordered to evaluate aortic valve calcification on coronary CTA. At Dec 2024 visit with Dr Lorie Rook BP was elevated so atenolol  was switched to Bystolic  2.5 mg daily. Medication went on back order. Patient were asked to continue only losartan  100 mg daily.  At last appointment in March hydrochlorothiazide was added to ARB and CCB. Pt was advised to check BP at home.   Patient presented today for hypertension clinic. Reports he checks BP every 1-2 weeks when he visits his daughter. She is NP so she check it manually. He has wrist monitor which is not accurate so he doesn't us  it. He is planning o buy new one. His home BP lately ~148-138/88 range. He has lost almost 10 lbs since he saw me last in March 2025. He has been following healthy low salt diet and stop eating processed food. He stays active with grandchildren. He gets stressed at doctors visits that is why his BP always high at OV  Current HTN meds: losartan  100 mg daily with hydrochlorothiazide 25 mg daily and amlodipine  5 mg daily  Previously tried: Bystolic  - due to recall  BP goal: <130/80   Family History:  Relation Problem Comments  Mother Cancer breast cancer  Dementia   Hypertension       Social History:  Alcohol: none  Smoking: none  Recreational drug use:one   Diet: improved on diet a lot since our last visit   Exercise:  knee arthritis limits. Golf every week.  Fishing - stand and walks a lot    Home BP readings: once a week  ~140/80 recalls from memory    Wt Readings from Last 3 Encounters:  04/13/23 218 lb (98.9 kg)  09/25/22 215 lb (97.5 kg)  06/19/22 212 lb (96.2 kg)   BP Readings from Last 3 Encounters:  09/20/23 (!) 145/103  07/05/23 (!) 160/110  04/13/23 (!) 160/80   Pulse Readings from Last 3 Encounters:  09/20/23 93  07/05/23 84  04/13/23 65    Renal function: CrCl cannot be calculated (Patient's most recent lab result is older than the maximum 21 days allowed.).  Past Medical History:  Diagnosis Date   ALLERGIC RHINITIS 05/04/2007   BACK PAIN, LUMBAR 02/17/2010   CHEST PAIN 07/15/2009   CONSTIPATION 02/17/2010   Heart murmur    remote hx of murmur   HYPERLIPIDEMIA 05/04/2007   HYPERTENSION 05/04/2007   Interstitial cystitis 08/30/2013   Lumbar disc disease 10/01/2011   NECK PAIN, ACUTE 07/15/2009   Prostate enlargement    PSVT 05/04/2007   Skin cancer 08/30/2013   Basal cell x 2, dec 2014   Spermatocele    VERTIGO 05/04/2007    Current Outpatient Medications on File Prior to Visit  Medication Sig Dispense Refill   amLODipine  (NORVASC )  5 MG tablet Take 1 tablet (5 mg total) by mouth daily. 180 tablet 3   aspirin 81 MG EC tablet Take 81 mg by mouth daily.     atorvastatin  (LIPITOR) 80 MG tablet Take 0.5 tablets (40 mg total) by mouth daily. 90 tablet 3   ezetimibe  (ZETIA ) 10 MG tablet Take 1 tablet (10 mg total) by mouth daily. 90 tablet 3   famotidine-calcium  carbonate-magnesium hydroxide (PEPCID COMPLETE) 10-800-165 MG chewable tablet Chew 1 tablet by mouth daily as needed.     fluticasone  (FLONASE ) 50 MCG/ACT nasal spray 2 sprays.     losartan -hydrochlorothiazide (HYZAAR) 100-25 MG tablet Take 1 tablet by mouth daily. 90 tablet 3   pantoprazole  (PROTONIX ) 40 MG tablet Take 1 tablet (40 mg total) by mouth 2 (two) times daily before a meal. 180 tablet 3   Probiotic Product (PROBIOTIC PO) Take by mouth daily.     bismuth  subsalicylate (RA STOMACH RELIEF) 262 MG/15ML suspension Take  by mouth.     No current facility-administered medications on file prior to visit.    No Known Allergies  Blood pressure (!) 145/103, pulse 93, SpO2 99%.   Assessment/Plan:  1. Hypertension -  Essential hypertension Assessment: BP is uncontrolled in office BP 145/103 mmHg heart rate 93 (goal <130/80) Takes current BP meds regularly and tolerates them well without any side effects Denies SOB, palpitation, chest pain, headaches,or swelling Home BP [phone] - manually checked by daughter  Reiterated the importance of regular exercise and low salt diet   Plan:  Start taking carvedilol  3.125 mg twice daily will f/u via phone in 2 weeks on tolerability if needed up the dose to 6.25 mg twice daily  Continue taking losartan  100 mg daily with hydrochlorothiazide 25 mg daily and amlodipine  5 mg daily  Pt to get new home BP monitor- upper arm cuff  Patient to keep record of BP readings with heart rate and report to us  at the next visit Patient to see PharmD in 4 weeks for follow up  Follow up lab(s):none    Thank you  Nickola Baron, Pharm.D  [facility] of [facility] [address] [place], [place] [address]  Phone: [phone]; Fax: [phone]

## 2023-09-20 ENCOUNTER — Ambulatory Visit: Attending: Cardiovascular Disease | Admitting: Pharmacist

## 2023-09-20 ENCOUNTER — Encounter: Payer: Self-pay | Admitting: Pharmacist

## 2023-09-20 ENCOUNTER — Other Ambulatory Visit: Payer: Self-pay | Admitting: Internal Medicine

## 2023-09-20 VITALS — BP 145/103 | HR 93

## 2023-09-20 DIAGNOSIS — I10 Essential (primary) hypertension: Secondary | ICD-10-CM | POA: Diagnosis not present

## 2023-09-20 MED ORDER — CARVEDILOL 3.125 MG PO TABS: 3.1250 mg | ORAL_TABLET | Freq: Two times a day (BID) | ORAL | 3 refills | Status: DC

## 2023-09-20 NOTE — Assessment & Plan Note (Signed)
 Assessment: BP is uncontrolled in office BP 145/103 mmHg heart rate 93 (goal <130/80) Takes current BP meds regularly and tolerates them well without any side effects Denies SOB, palpitation, chest pain, headaches,or swelling Home BP [phone] - manually checked by daughter  Reiterated the importance of regular exercise and low salt diet   Plan:  Start taking carvedilol  3.125 mg twice daily will f/u via phone in 2 weeks on tolerability if needed up the dose to 6.25 mg twice daily  Continue taking losartan  100 mg daily with hydrochlorothiazide 25 mg daily and amlodipine  5 mg daily  Pt to get new home BP monitor- upper arm cuff  Patient to keep record of BP readings with heart rate and report to us  at the next visit Patient to see PharmD in 4 weeks for follow up  Follow up lab(s):none

## 2023-09-20 NOTE — Patient Instructions (Addendum)
 Changes made by your pharmacist Nickola Baron, PharmD at today's visit:    Instructions/Changes  (what do you need to do) Your Notes  (what you did and when you did it)  Start taking carvedilol  3.125 mg twice daily    Continue taking losartan  hydrochlorothiazide 100/25 mg daily and amlodipine  5 mg daily    Keep up with your good work on healthy diet     Bring all of your meds, your BP cuff and your record of home blood pressures to your next appointment.    HOW TO TAKE YOUR BLOOD PRESSURE AT HOME  Rest 5 minutes before taking your blood pressure.  Don't smoke or drink caffeinated beverages for at least 30 minutes before. Take your blood pressure before (not after) you eat. Sit comfortably with your back supported and both feet on the floor (don't cross your legs). Elevate your arm to heart level on a table or a desk. Use the proper sized cuff. It should fit smoothly and snugly around your bare upper arm. There should be enough room to slip a fingertip under the cuff. The bottom edge of the cuff should be 1 inch above the crease of the elbow. Ideally, take 3 measurements at one sitting and record the average.  Important lifestyle changes to control high blood pressure  Intervention  Effect on the BP  Lose extra pounds and watch your waistline Weight loss is one of the most effective lifestyle changes for controlling blood pressure. If you're overweight or obese, losing even a small amount of weight can help reduce blood pressure. Blood pressure might go down by about 1 millimeter of mercury (mm Hg) with each kilogram (about 2.2 pounds) of weight lost.  Exercise regularly As a general goal, aim for at least 30 minutes of moderate physical activity every day. Regular physical activity can lower high blood pressure by about 5 to 8 mm Hg.  Eat a healthy diet Eating a diet rich in whole grains, fruits, vegetables, and low-fat dairy products and low in saturated fat and cholesterol. A  healthy diet can lower high blood pressure by up to 11 mm Hg.  Reduce salt (sodium) in your diet Even a small reduction of sodium in the diet can improve heart health and reduce high blood pressure by about 5 to 6 mm Hg.  Limit alcohol One drink equals 12 ounces of beer, 5 ounces of wine, or 1.5 ounces of 80-proof liquor.  Limiting alcohol to less than one drink a day for women or two drinks a day for men can help lower blood pressure by about 4 mm Hg.   If you have any questions or concerns please use My Chart to send questions or call the office at [phone]

## 2023-10-05 ENCOUNTER — Telehealth: Payer: Self-pay | Admitting: Pharmacist

## 2023-10-05 NOTE — Telephone Encounter (Signed)
 Carvedilol  started 2 weeks back Call to f/u on BP, N/A LVM to call back.

## 2023-10-12 DIAGNOSIS — H608X3 Other otitis externa, bilateral: Secondary | ICD-10-CM | POA: Diagnosis not present

## 2023-10-12 DIAGNOSIS — H6123 Impacted cerumen, bilateral: Secondary | ICD-10-CM | POA: Insufficient documentation

## 2023-10-13 ENCOUNTER — Encounter: Payer: Self-pay | Admitting: Internal Medicine

## 2023-10-21 ENCOUNTER — Encounter: Payer: Self-pay | Admitting: Pharmacist

## 2023-10-21 ENCOUNTER — Ambulatory Visit: Attending: Cardiovascular Disease | Admitting: Pharmacist

## 2023-10-21 VITALS — BP 162/92 | HR 75

## 2023-10-21 DIAGNOSIS — I10 Essential (primary) hypertension: Secondary | ICD-10-CM

## 2023-10-21 MED ORDER — CARVEDILOL 6.25 MG PO TABS: 6.2500 mg | ORAL_TABLET | Freq: Two times a day (BID) | ORAL | 3 refills | Status: AC

## 2023-10-21 MED ORDER — TELMISARTAN-HCTZ 80-25 MG PO TABS: 1.0000 | ORAL_TABLET | Freq: Every day | ORAL | 3 refills | Status: DC

## 2023-10-21 NOTE — Progress Notes (Signed)
 Patient ID: Troy [name]                 DOB: January 14, 1946                      MRN: [id number]      HPI: Troy [name] is a 78 y.o. male referred by Dr. Lorie [name] to HTN clinic. PMH is significant for hypertension, RBBB, PSVT, CAD, HLD, stage 2 CKD non rheumatic aortic valve stenosis    At PCP An EKG was done which demonstrated a right bundle branch block Nov 2022 pt was seen for chest pain at heart care, coronary CTA- mild obstruction at Oct 2023 f/u Lipitor dose were optimized and echo was ordered to evaluate aortic valve calcification on coronary CTA. At Dec 2024 visit with Dr Troy [name] BP was elevated so atenolol  was switched to Bystolic  2.5 mg daily. Medication went on back order. Patient were asked to continue only losartan  100 mg daily.  At last appointment in March hydrochlorothiazide was added to ARB and CCB. Pt was advised to check BP at home.   At last visit we added coreg  to other 1st line antihypertensives. Patient presented today for follow up. Reports he tolerates that well, he brought in his BP monitor for validation - lately his BP saying above the goal. His BP at the last OV was elevated and today it was 162/92 heart rate 75. Home BP monitor validated - it reads within 10 points compared to the OV readings. He watches what he eats in term of salt intake   Current HTN meds: Micardis plus 80/25 mg daily, carvedilol  6.25 mg twice daily and amlodipine  5 mg daily  Previously tried: Bystolic  - due to recall  BP goal: <130/80   Family History:  Relation Problem Comments  Mother Cancer breast cancer  Dementia   Hypertension       Social History:  Alcohol: none  Smoking: none  Recreational drug use:one   Diet: improved on diet a lot since our last visit   Exercise:  knee arthritis limits. Golf every week.  Fishing - stand and walks a lot    Home BP readings:  SBP DBP  132 83  115 74  122 86  149 89  129 79  127 77  135 84  135 85  120 73  150 93  138 79  132  82        Wt Readings from Last 3 Encounters:  04/13/23 218 lb (98.9 kg)  09/25/22 215 lb (97.5 kg)  06/19/22 212 lb (96.2 kg)   BP Readings from Last 3 Encounters:  10/21/23 (!) 162/92  09/20/23 (!) 145/103  07/05/23 (!) 160/110   Pulse Readings from Last 3 Encounters:  10/21/23 75  09/20/23 93  07/05/23 84    Renal function: CrCl cannot be calculated (Patient's most recent lab result is older than the maximum 21 days allowed.).  Past Medical History:  Diagnosis Date   ALLERGIC RHINITIS 05/04/2007   BACK PAIN, LUMBAR 02/17/2010   CHEST PAIN 07/15/2009   CONSTIPATION 02/17/2010   Heart murmur    remote hx of murmur   HYPERLIPIDEMIA 05/04/2007   HYPERTENSION 05/04/2007   Interstitial cystitis 08/30/2013   Lumbar disc disease 10/01/2011   NECK PAIN, ACUTE 07/15/2009   Prostate enlargement    PSVT 05/04/2007   Skin cancer 08/30/2013   Basal cell x 2, dec 2014   Spermatocele    VERTIGO 05/04/2007  Current Outpatient Medications on File Prior to Visit  Medication Sig Dispense Refill   amLODipine  (NORVASC ) 5 MG tablet Take 1 tablet (5 mg total) by mouth daily. 180 tablet 3   aspirin 81 MG EC tablet Take 81 mg by mouth daily.     atorvastatin  (LIPITOR) 80 MG tablet Take 0.5 tablets (40 mg total) by mouth daily. 90 tablet 3   bismuth  subsalicylate (RA STOMACH RELIEF) 262 MG/15ML suspension Take by mouth.     ezetimibe  (ZETIA ) 10 MG tablet Take 1 tablet (10 mg total) by mouth daily. 90 tablet 3   famotidine-calcium  carbonate-magnesium hydroxide (PEPCID COMPLETE) 10-800-165 MG chewable tablet Chew 1 tablet by mouth daily as needed.     fluticasone  (FLONASE ) 50 MCG/ACT nasal spray 2 sprays.     pantoprazole  (PROTONIX ) 40 MG tablet Take 1 tablet (40 mg total) by mouth 2 (two) times daily before a meal. 180 tablet 3   Probiotic Product (PROBIOTIC PO) Take by mouth daily.     traMADol  (ULTRAM ) 50 MG tablet TAKE 1 TABLET BY MOUTH EVERY 6 HOURS AS NEEDED 60 tablet 0   No  current facility-administered medications on file prior to visit.    No Known Allergies  Blood pressure (!) 162/92, pulse 75, SpO2 98%.   Assessment/Plan:  1. Hypertension -  Essential hypertension Assessment: BP is uncontrolled in office BP 162/92 mmHg heart rate 75 (goal <130/80) Takes current BP meds regularly and tolerates them well without any side effects Denies SOB, palpitation, chest pain, headaches,or swelling Home BP ~132/82 on [employer] brand BP  Reiterated the importance of regular exercise and low salt diet   Plan:  Stop taking losartan  100 mg daily with hydrochlorothiazide 25 mg daily and start taking telmisartan/hydrochlorothiazide 80/25 mg once day  Increase carvedilol  dose from 3.125 mg twice daily to 6.25 mg twice daily  Continue taking amlodipine  5 mg daily  Patient to keep record of BP readings with heart rate and report to us  at the next visit Patient to see PharmD in 2 weeks for follow up  Follow up lab(s):BMP in 2 weeks    Thank you  Nickola Baron, Pharm.D [facility] A Division of [facility] [address], [place], [place] [address]  Phone: [phone]; Fax: [phone]

## 2023-10-21 NOTE — Patient Instructions (Addendum)
 Changes made by your pharmacist Nickola Baron, PharmD at today's visit:    Instructions/Changes  (what do you need to do) Your Notes  (what you did and when you did it)  Stop taking losartan  100 mg daily with hydrochlorothiazide 25 mg daily and start taking telmisartan/hydrochlorothiazide 80/25 mg once day    Increase carvedilol  dose from 3.125 mg twice daily to 6.25 mg twice daily    Continue taking amlodipine  5 mg daily    Reduce salt intake     Bring all of your meds, your BP cuff and your record of home blood pressures to your next appointment.    HOW TO TAKE YOUR BLOOD PRESSURE AT HOME  Rest 5 minutes before taking your blood pressure.  Don't smoke or drink caffeinated beverages for at least 30 minutes before. Take your blood pressure before (not after) you eat. Sit comfortably with your back supported and both feet on the floor (don't cross your legs). Elevate your arm to heart level on a table or a desk. Use the proper sized cuff. It should fit smoothly and snugly around your bare upper arm. There should be enough room to slip a fingertip under the cuff. The bottom edge of the cuff should be 1 inch above the crease of the elbow. Ideally, take 3 measurements at one sitting and record the average.  Important lifestyle changes to control high blood pressure  Intervention  Effect on the BP  Lose extra pounds and watch your waistline Weight loss is one of the most effective lifestyle changes for controlling blood pressure. If you're overweight or obese, losing even a small amount of weight can help reduce blood pressure. Blood pressure might go down by about 1 millimeter of mercury (mm Hg) with each kilogram (about 2.2 pounds) of weight lost.  Exercise regularly As a general goal, aim for at least 30 minutes of moderate physical activity every day. Regular physical activity can lower high blood pressure by about 5 to 8 mm Hg.  Eat a healthy diet Eating a diet rich in whole grains,  fruits, vegetables, and low-fat dairy products and low in saturated fat and cholesterol. A healthy diet can lower high blood pressure by up to 11 mm Hg.  Reduce salt (sodium) in your diet Even a small reduction of sodium in the diet can improve heart health and reduce high blood pressure by about 5 to 6 mm Hg.  Limit alcohol One drink equals 12 ounces of beer, 5 ounces of wine, or 1.5 ounces of 80-proof liquor.  Limiting alcohol to less than one drink a day for women or two drinks a day for men can help lower blood pressure by about 4 mm Hg.   If you have any questions or concerns please use My Chart to send questions or call the office at [phone]

## 2023-10-21 NOTE — Assessment & Plan Note (Signed)
 Assessment: BP is uncontrolled in office BP 162/92 mmHg heart rate 75 (goal <130/80) Takes current BP meds regularly and tolerates them well without any side effects Denies SOB, palpitation, chest pain, headaches,or swelling Home BP ~132/82 on Omron brand BP  Reiterated the importance of regular exercise and low salt diet   Plan:  Stop taking losartan  100 mg daily with hydrochlorothiazide 25 mg daily and start taking telmisartan/hydrochlorothiazide 80/25 mg once day  Increase carvedilol  dose from 3.125 mg twice daily to 6.25 mg twice daily  Continue taking amlodipine  5 mg daily  Patient to keep record of BP readings with heart rate and report to us  at the next visit Patient to see PharmD in 2 weeks for follow up  Follow up lab(s):BMP in 2 weeks

## 2023-10-25 ENCOUNTER — Telehealth: Payer: Self-pay | Admitting: Pharmacist

## 2023-10-25 NOTE — Telephone Encounter (Signed)
 Patient LVM reporting insurance is not covering telmisartan  -hydrochlorothiazide.

## 2023-10-26 ENCOUNTER — Other Ambulatory Visit (HOSPITAL_COMMUNITY): Payer: Self-pay

## 2023-10-26 ENCOUNTER — Telehealth: Payer: Self-pay

## 2023-10-26 MED ORDER — IRBESARTAN-HYDROCHLOROTHIAZIDE 300-12.5 MG PO TABS: 1.0000 | ORAL_TABLET | Freq: Every day | ORAL | 3 refills | Status: AC

## 2023-10-26 NOTE — Addendum Note (Signed)
 Addended by: Normon Pettijohn K on: 10/26/2023 03:13 PM   Modules accepted: Orders

## 2023-10-26 NOTE — Telephone Encounter (Signed)
 Pharmacy Patient Advocate Encounter  Insurance verification completed.   The patient is insured through [facility]   Ran test claim for TELMISARTAN -HTCZ 80-25. Currently a quantity of 30 is a 30 day supply and the co-pay is NA . PRODUCT NOT ON FORMULARY. PLAN WAS COVERING LOSARTAN  HCTZ  This test claim was processed through [facility]- copay amounts may vary at other pharmacies due to pharmacy/plan contracts, or as the patient moves through the different stages of their insurance plan.

## 2023-10-26 NOTE — Telephone Encounter (Signed)
 Pharmacy Patient Advocate Encounter  Insurance verification completed.   The patient is insured through [employer] test claim for IRBESARTAN-HCTZ 150-12.5 MG. Currently a quantity of 30 is a 30 day supply and the co-pay is $3 .    This test claim was processed through [facility]- copay amounts may vary at other pharmacies due to pharmacy/plan contracts, or as the patient moves through the different stages of their insurance plan.

## 2023-10-26 NOTE — Telephone Encounter (Addendum)
 Patient was informed that Micardis  Plus is not covered by their insurance plan. The plan's preferred long-acting ARB is irbesartan.  Patient understands and agrees to discontinue losartan  and to start irbesartan/hydrochlorothiazide 300 mg/12.5 mg once daily.

## 2023-10-27 ENCOUNTER — Encounter: Payer: Self-pay | Admitting: Physician Assistant

## 2023-10-27 ENCOUNTER — Ambulatory Visit: Attending: Cardiology | Admitting: Physician Assistant

## 2023-10-27 ENCOUNTER — Other Ambulatory Visit: Payer: Self-pay | Admitting: Internal Medicine

## 2023-10-27 VITALS — BP 119/63 | HR 88 | Ht 74.5 in | Wt 217.2 lb

## 2023-10-27 DIAGNOSIS — I2510 Atherosclerotic heart disease of native coronary artery without angina pectoris: Secondary | ICD-10-CM | POA: Diagnosis not present

## 2023-10-27 DIAGNOSIS — E7800 Pure hypercholesterolemia, unspecified: Secondary | ICD-10-CM | POA: Diagnosis not present

## 2023-10-27 DIAGNOSIS — I739 Peripheral vascular disease, unspecified: Secondary | ICD-10-CM | POA: Insufficient documentation

## 2023-10-27 DIAGNOSIS — I10 Essential (primary) hypertension: Secondary | ICD-10-CM | POA: Diagnosis not present

## 2023-10-27 DIAGNOSIS — I350 Nonrheumatic aortic (valve) stenosis: Secondary | ICD-10-CM | POA: Diagnosis not present

## 2023-10-27 NOTE — Patient Instructions (Signed)
 Medication Instructions:  Your physician recommends that you continue on your current medications as directed. Please refer to the Current Medication list given to you today.  *If you need a refill on your cardiac medications before your next appointment, please call your pharmacy*  Lab Work: COME BACK ONE DAY THIS WEEK, NEXT, FASTING, FOR :  LIPID & LFT  If you have labs (blood work) drawn today and your tests are completely normal, you will receive your results only by: MyChart Message (if you have MyChart) OR A paper copy in the mail If you have any lab test that is abnormal or we need to change your treatment, we will call you to review the results.  Testing/Procedures: Your physician recommends you have Vas US  TBI study.  Follow-Up: At [facility], you and your health needs are our priority.  As part of our continuing mission to provide you with exceptional heart care, our providers are all part of one team.  This team includes your primary Cardiologist (physician) and Advanced Practice Providers or APPs (Physician Assistants and Nurse Practitioners) who all work together to provide you with the care you need, when you need it.  Your next appointment:   1 year(s)  Provider:   Glendia Ferrier, PA-C          We recommend signing up for the patient portal called MyChart.  Sign up information is provided on this After Visit Summary.  MyChart is used to connect with patients for Virtual Visits (Telemedicine).  Patients are able to view lab/test results, encounter notes, upcoming appointments, etc.  Non-urgent messages can be sent to your provider as well.   To learn more about what you can do with MyChart, go to [email or web address].   Other Instructions

## 2023-10-27 NOTE — Assessment & Plan Note (Signed)
 Mild non-obstructive coronary artery disease on coronary CTA in November 2022. He is doing well w/o chest pain to suggest angina.   - Continue aspirin 81 mg daily - Continue Lipitor 80 mg daily - Follow up 1 year.

## 2023-10-27 NOTE — Assessment & Plan Note (Signed)
 Mild aortic stenosis as per echocardiogram in January 2024 with a mean gradient of 10 mmHg. No symptoms suggestive of worsening aortic stenosis reported. - Plan follow-up echocardiogram around January 2027

## 2023-10-27 NOTE — Assessment & Plan Note (Signed)
 LDL cholesterol was 79 mg/dL in December 7975. Goal is less than 70 mg/dL. Ezetimibe  was added.  - Order fasting lipid panel and ALT - Continue Lipitor 40 mg daily - Continue ezetimibe  10 mg daily

## 2023-10-27 NOTE — Assessment & Plan Note (Signed)
 Blood pressure is well-controlled with current medication regimen. Home readings are also optimal. - Continue amlodipine  5 mg daily - Continue carvedilol  6.25 mg twice daily - Continue irbesartan/HCTZ 300/12.5 mg daily

## 2023-10-27 NOTE — Progress Notes (Signed)
 OFFICE NOTE:    Date:  10/27/2023  ID:  Troy [name], DOB 1946/02/12, MRN [id number] PCP: Norleen Lynwood ORN, MD  [facility] Providers Cardiologist:  Lurena MARLA Red, MD       Patient Profile:  Coronary artery disease CCTA 03/07/2021: CAC score 888 (74th percentile), mild nonobstructive CAD [RCA proximal and mid 25-49, LM 20, LAD proximal and mid Ca2+, LCx Ca2+] Aortic stenosis TTE 05/26/2022: EF 57, no RWMA, mild LVH, GR 1 DD, normal RVSF, normal PASP, RVSP 23.4, mild MR, mild AV Ca2+, mild AS (mean gradient 10, V-max 216 cm/s, DI 0.55), RAP 3 Right Bundle Branch Block  Hyperlipidemia Hypertension Chronic kidney disease Aortic atherosclerosis       Discussed the use of AI scribe software for clinical note transcription with the patient, who gave verbal consent to proceed. History of Present Illness Troy [name] is a 78 y.o. male who returns for follow up of CAD, AS, HTN. He was last seen by Dr. Red in 04/2023. He has been followed in the PharmD HTN clinic with adjustments made in his regimen for better BP control.   He is here alone. He denies chest pain or shortness of breath. He experiences bilateral leg pain, particularly in the calves, when climbing stairs, described as 'heavy'. He does not experience this pain when walking on level ground. He denies sores on his feet and has never smoked. Home blood pressure readings average 123/80. He has not had syncope. He notes orthostatic intolerance when bending over to pick up his ball while playing golf.   ROS-See HPI    Studies Reviewed:      Results LABS Lipid panel: Total cholesterol 195, HDL 74, LDL 79, Triglycerides 264 (04/13/2023)        Physical Exam:  VS:  BP 119/63   Pulse 88   Ht 6' 2.5 (1.892 m)   Wt 217 lb 3.2 oz (98.5 kg)   SpO2 97%   BMI 27.51 kg/m        Wt Readings from Last 3 Encounters:  10/27/23 217 lb 3.2 oz (98.5 kg)  04/13/23 218 lb (98.9 kg)  09/25/22 215 lb (97.5 kg)     Constitutional:      Appearance: Healthy appearance. Not in distress.  Neck:     Vascular: JVD normal.  Pulmonary:     Breath sounds: Normal breath sounds. No wheezing. No rales.  Cardiovascular:     Normal rate. Regular rhythm.     Murmurs: There is a grade 2/6 systolic murmur at the URSB.  Edema:    Peripheral edema absent.  Abdominal:     Palpations: Abdomen is soft.        Assessment and Plan:    Assessment & Plan Coronary artery disease involving native coronary artery of native heart without angina pectoris Mild non-obstructive coronary artery disease on coronary CTA in November 2022. He is doing well w/o chest pain to suggest angina.   - Continue aspirin 81 mg daily - Continue Lipitor 80 mg daily - Follow up 1 year. Essential hypertension Blood pressure is well-controlled with current medication regimen. Home readings are also optimal. - Continue amlodipine  5 mg daily - Continue carvedilol  6.25 mg twice daily - Continue irbesartan/HCTZ 300/12.5 mg daily Nonrheumatic aortic (valve) stenosis Mild aortic stenosis as per echocardiogram in January 2024 with a mean gradient of 10 mmHg. No symptoms suggestive of worsening aortic stenosis reported. - Plan follow-up echocardiogram around January 2027 Pure hypercholesterolemia LDL cholesterol  was 79 mg/dL in December 7975. Goal is less than 70 mg/dL. Ezetimibe  was added.  - Order fasting lipid panel and ALT - Continue Lipitor 40 mg daily - Continue ezetimibe  10 mg daily Claudication (HCC) Bilateral calf pain when climbing stairs. On exam, he has good pulses. He is at risk for PAD. It would be best to rule out significant disease. - Order ankle-brachial indices (ABIs) to assess for PAD - If ABIs are normal, would follow up with back specialist to evaluate for back-related symptoms        Dispo:  No follow-ups on file.  Signed, Glendia Ferrier, PA-C

## 2023-11-03 DIAGNOSIS — I2510 Atherosclerotic heart disease of native coronary artery without angina pectoris: Secondary | ICD-10-CM | POA: Diagnosis not present

## 2023-11-03 DIAGNOSIS — E7800 Pure hypercholesterolemia, unspecified: Secondary | ICD-10-CM | POA: Diagnosis not present

## 2023-11-03 LAB — ALT: ALT: 20 IU/L (ref 0–44)

## 2023-11-03 LAB — LIPID PANEL
Chol/HDL Ratio: 4.8 ratio (ref 0.0–5.0)
Cholesterol, Total: 247 mg/dL — ABNORMAL HIGH (ref 100–199)
HDL: 52 mg/dL (ref >39)
LDL Chol Calc (NIH): 149 mg/dL — ABNORMAL HIGH (ref 0–99)
Triglycerides: 251 mg/dL — ABNORMAL HIGH (ref 0–149)
VLDL Cholesterol Cal: 46 mg/dL — ABNORMAL HIGH (ref 5–40)

## 2023-11-04 ENCOUNTER — Ambulatory Visit: Payer: Self-pay | Admitting: Physician Assistant

## 2023-11-04 DIAGNOSIS — I2510 Atherosclerotic heart disease of native coronary artery without angina pectoris: Secondary | ICD-10-CM

## 2023-11-04 DIAGNOSIS — E7800 Pure hypercholesterolemia, unspecified: Secondary | ICD-10-CM

## 2023-11-04 DIAGNOSIS — R6889 Other general symptoms and signs: Secondary | ICD-10-CM

## 2023-11-04 DIAGNOSIS — I739 Peripheral vascular disease, unspecified: Secondary | ICD-10-CM

## 2023-11-04 NOTE — Telephone Encounter (Signed)
 Hi Vaishali You have an appt with him soon for HTN (I think). He has side effects to statins. Can you tag on a visit for Lipids as well? I think we need to try to get him on a PCSK9i. Thanks [place], [place]    11/04/2023 3:06 PM

## 2023-11-08 ENCOUNTER — Encounter: Payer: Self-pay | Admitting: Pharmacist

## 2023-11-08 ENCOUNTER — Ambulatory Visit: Attending: Cardiology | Admitting: Pharmacist

## 2023-11-08 VITALS — BP 125/83 | HR 82

## 2023-11-08 DIAGNOSIS — I10 Essential (primary) hypertension: Secondary | ICD-10-CM | POA: Insufficient documentation

## 2023-11-08 DIAGNOSIS — E7849 Other hyperlipidemia: Secondary | ICD-10-CM | POA: Diagnosis not present

## 2023-11-08 MED ORDER — ROSUVASTATIN CALCIUM 10 MG PO TABS: 10.0000 mg | ORAL_TABLET | Freq: Every day | ORAL | 3 refills | Status: AC

## 2023-11-08 NOTE — Progress Notes (Signed)
 Patient ID: Troy [name]                 DOB: [birth year]                      MRN: [id number]      HPI: Troy [name] is a 78 y.o. male referred by Dr. Wendel to HTN clinic. PMH is significant for hypertension, RBBB, PSVT, CAD, HLD, stage 2 CKD non rheumatic aortic valve stenosis    At PCP An EKG was done which demonstrated a right bundle branch block Nov 2022 pt was seen for chest pain at heart care, coronary CTA- mild obstruction at Oct 2023 f/u Lipitor dose were optimized and echo was ordered to evaluate aortic valve calcification on coronary CTA. At Dec 2024 visit with Dr Wendel BP was elevated so atenolol  was switched to Bystolic  2.5 mg daily. Medication went on back order. Patient were asked to continue only losartan  100 mg daily.  At last appointment in March hydrochlorothiazide  was added to ARB and CCB. Pt was advised to check BP at home. At last visit home BP monitor was validates, found to be accurate. Hydrochlorothiazide  was added to ARB to get BP at goal. Pt saw PA 10/27/23 lipid panel was checked post Zetia  addition to statin to lower LDL 79 mg/dl to goal (70mg /dl) recent lab reveled LDL 149 and TG 251 patient reports he had stopped taking statin due to muscle aches.   Today, the patient presented with their home blood pressure (BP) monitor, which was found to be accurate. Home BP readings are generally at goal, with only a few elevated readings in the 140-145/83 mmHg range, typically occurring during times of stress and pain.  The patient reported frequent consumption of hamburgers and processed foods. However, after reviewing his triglyceride results on the recent lipid panel, he has stopped eating processed foods and started incorporating more fish into his diet. He remains active around the house and plans to restart golfing, as he no longer experiences leg pain since stopping Lipitor.  We discussed treatment options including Crestor  and PCSK9 inhibitors. The patient agrees  to initiate Crestor  10 mg and will adjust the dose based on tolerability.  Current HTN meds: Avalide 300/12.5 mg daily  carvedilol  6.25 mg twice daily and amlodipine  5 mg daily  Previously tried: Bystolic  - due to recall  BP goal: <130/80  Current lipid medications: Ezetimibe  10 mg daily Intolerance: atorvastatin  - myalgia  LDL goal: <70 mg/dl <849 mg/dl  Risk factors: CAD, Aortic valve stenosis, hypertension, HLD, PAD     Family History:  Relation Problem Comments  Mother Cancer breast cancer  Dementia   Hypertension       Social History:  Alcohol: none  Smoking: none  Recreational drug use:one   Diet: has cut down on processed food since last 2 days and will be focusing on healthy whole food   Exercise:  knee arthritis limits. Golf every week.  Fishing - stand and walks a lot  Walk the dog twice a day  Stays active around the house   Home BP readings:  SBP DBP HR  120 78 67  141 82 76  132 78 67  122 75 66  119 73 63  137 83 67  150 87 69  140 88 72  123  77 69  143 89 72  140 83 63  133.3636 81.18182 68.27273     Wt Readings from Last 3 Encounters:  10/27/23  217 lb 3.2 oz (98.5 kg)  04/13/23 218 lb (98.9 kg)  09/25/22 215 lb (97.5 kg)   BP Readings from Last 3 Encounters:  11/08/23 125/83  10/27/23 119/63  10/21/23 (!) 162/92   Pulse Readings from Last 3 Encounters:  11/08/23 82  10/27/23 88  10/21/23 75    Renal function: CrCl cannot be calculated (Patient's most recent lab result is older than the maximum 21 days allowed.).  Past Medical History:  Diagnosis Date   ALLERGIC RHINITIS 05/04/2007   BACK PAIN, LUMBAR 02/17/2010   CAD (coronary artery disease) 10/27/2023   CCTA 03/07/2021: CAC score 888 (74th percentile), mild nonobstructive CAD [RCA proximal and mid 25-49, LM 20, LAD proximal and mid Ca2+, LCx Ca2+]    CHEST PAIN 07/15/2009   CONSTIPATION 02/17/2010   Heart murmur    remote hx of murmur   HYPERLIPIDEMIA 05/04/2007    HYPERTENSION 05/04/2007   Interstitial cystitis 08/30/2013   Lumbar disc disease 10/01/2011   NECK PAIN, ACUTE 07/15/2009   Nonrheumatic aortic (valve) stenosis 10/27/2023   TTE 05/26/2022: EF 57, no RWMA, mild LVH, GR 1 DD, normal RVSF, normal PASP, RVSP 23.4, mild MR, mild AV Ca2+, mild AS (mean gradient 10, V-max 216 cm/s, DI 0.55), RAP 3    Prostate enlargement    PSVT 05/04/2007   Skin cancer 08/30/2013   Basal cell x 2, dec 2014   Spermatocele    VERTIGO 05/04/2007    Current Outpatient Medications on File Prior to Visit  Medication Sig Dispense Refill   amLODipine  (NORVASC ) 5 MG tablet Take 1 tablet (5 mg total) by mouth daily. 180 tablet 3   aspirin 81 MG EC tablet Take 81 mg by mouth daily.     carvedilol  (COREG ) 6.25 MG tablet Take 1 tablet (6.25 mg total) by mouth 2 (two) times daily. 180 tablet 3   ezetimibe  (ZETIA ) 10 MG tablet Take 1 tablet (10 mg total) by mouth daily. 90 tablet 3   famotidine-calcium  carbonate-magnesium hydroxide (PEPCID COMPLETE) 10-800-165 MG chewable tablet Chew 1 tablet by mouth daily as needed.     fluticasone  (FLONASE ) 50 MCG/ACT nasal spray 2 sprays.     irbesartan -hydrochlorothiazide  (AVALIDE) 300-12.5 MG tablet Take 1 tablet by mouth daily. 90 tablet 3   traMADol  (ULTRAM ) 50 MG tablet TAKE 1 TABLET BY MOUTH EVERY 6 HOURS AS NEEDED (Patient taking differently: Take 50 mg by mouth daily.) 60 tablet 2   No current facility-administered medications on file prior to visit.    Allergies  Allergen Reactions   Atorvastatin      Statin myopathy    Blood pressure 125/83, pulse 82, SpO2 95%.   Assessment/Plan:  1. Hypertension -  Essential hypertension Assessment: BP is controlled in office BP 125/82 mmHg heart rate 83 (goal <130/80) Takes current BP meds regularly and tolerates them well without any side effects Denies SOB, palpitation, chest pain, headaches,or swelling Home BP ~133/81 on Omron brand BP  Reiterated the importance of regular  exercise and low salt diet   Plan:  Continue taking amlodipine  5 mg daily, Avalide 300/12.5 mg daily carvedilol  6.25 mg twice daily  Patient to keep record of BP readings with heart rate and report to us  if home BP persistently remains above the goal  BMP: due today    Hyperlipidemia Assessment:  LDL goal: < 70 mg/dl TG <849 mg/dl  last LDLc 850 mg/dl TG 748 mg/dl (93/7974) Tolerates Zetia  well without any side effects  Intolerance to high intensity statins atorvastatin  40  mg and 80 mg - myalgia and joint pain  Discussed next potential options (other statins, PCSK-9 inhibitors, bempedoic acid and inclisiran); cost, dosing efficacy, side effects  Reiterated importance of healthy diet and regular physical activity to lower TG   Plan: Continue taking current medications (Zetia  10 mg daily) start taking rosuvastatin  10 mg daily if not tolerated reduce dose to 5 mg daily or even 5 mg every other day Will apply for PA for PCSK9i; will inform patient upon approval  Patient to limit sugars and refined carbohydrates,choose healthy fats, increase fish and fiber intake and aim for at least 30 minutes of moderate-intensity exercise most days of the week Lipid lab due in 2-3 months after therapy modification    Thank you  Robbi Blanch, Pharm.D [facility] of [facility] [address] [place], [place] [address]  Phone: [phone]; Fax: [phone]

## 2023-11-08 NOTE — Patient Instructions (Signed)
 Your Results:             Your most recent labs Goal  Total Cholesterol 247 < 200  Triglycerides 251 < 150  HDL (happy/good cholesterol) 52 > 40  LDL (lousy/bad cholesterol 149 < 70   Medication changes: start taking rosuvastatin  10 mg daily if not tolerated reduce dose to 5 mg daily or even 5 mg every other day. Continue taking ezetimibe  10 mg daily  We will start the process to get PCSK9i( Repatha or Praluent) covered by your insurance.  Once the prior authorization is complete, we will call you to let you know and confirm pharmacy information.    Praluent is a cholesterol medication that improved your body's ability to get rid of bad cholesterol known as LDL. It can lower your LDL up to 60%. It is an injection that is given under the skin every 2 weeks. The most common side effects of Praluent include runny nose, symptoms of the common cold, rarely flu or flu-like symptoms, back/muscle pain in about 3-4% of the patients, and redness, pain, or bruising at the injection site.    Repatha is a cholesterol medication that improved your body's ability to get rid of bad cholesterol known as LDL. It can lower your LDL up to 60%! It is an injection that is given under the skin every 2 weeks. The medication often requires a prior authorization from your insurance company. We will take care of submitting all the necessary information to your insurance company to get it approved. The most common side effects of Repatha include runny nose, symptoms of the common cold, rarely flu or flu-like symptoms, back/muscle pain in about 3-4% of the patients, and redness, pain, or bruising at the injection site.   Lab orders: We want to repeat labs after 2-3 months.  We will send you a lab order to remind you once we get closer to that time.

## 2023-11-08 NOTE — Assessment & Plan Note (Signed)
 Assessment:  LDL goal: < 70 mg/dl TG <849 mg/dl  last LDLc 850 mg/dl TG 748 mg/dl (93/7974) Tolerates Zetia  well without any side effects  Intolerance to high intensity statins atorvastatin  40 mg and 80 mg - myalgia and joint pain  Discussed next potential options (other statins, PCSK-9 inhibitors, bempedoic acid and inclisiran); cost, dosing efficacy, side effects  Reiterated importance of healthy diet and regular physical activity to lower TG   Plan: Continue taking current medications (Zetia  10 mg daily) start taking rosuvastatin  10 mg daily if not tolerated reduce dose to 5 mg daily or even 5 mg every other day Will apply for PA for PCSK9i; will inform patient upon approval  Patient to limit sugars and refined carbohydrates,choose healthy fats, increase fish and fiber intake and aim for at least 30 minutes of moderate-intensity exercise most days of the week Lipid lab due in 2-3 months after therapy modification

## 2023-11-08 NOTE — Assessment & Plan Note (Signed)
 Assessment: BP is controlled in office BP 125/82 mmHg heart rate 83 (goal <130/80) Takes current BP meds regularly and tolerates them well without any side effects Denies SOB, palpitation, chest pain, headaches,or swelling Home BP ~133/81 on Omron brand BP  Reiterated the importance of regular exercise and low salt diet   Plan:  Continue taking amlodipine  5 mg daily, Avalide 300/12.5 mg daily carvedilol  6.25 mg twice daily  Patient to keep record of BP readings with heart rate and report to us  if home BP persistently remains above the goal  BMP: due today

## 2023-11-09 ENCOUNTER — Ambulatory Visit (HOSPITAL_COMMUNITY)
Admission: RE | Admit: 2023-11-09 | Discharge: 2023-11-09 | Disposition: A | Discharge status: Home / Self Care | Source: Ambulatory Visit | Attending: Physician Assistant | Admitting: Physician Assistant

## 2023-11-09 DIAGNOSIS — I10 Essential (primary) hypertension: Secondary | ICD-10-CM | POA: Diagnosis not present

## 2023-11-09 DIAGNOSIS — I739 Peripheral vascular disease, unspecified: Secondary | ICD-10-CM | POA: Insufficient documentation

## 2023-11-10 ENCOUNTER — Ambulatory Visit: Payer: Self-pay | Admitting: Pharmacist

## 2023-11-10 LAB — BASIC METABOLIC PANEL WITH GFR
BUN/Creatinine Ratio: 24 (ref 10–24)
BUN: 23 mg/dL (ref 8–27)
CO2: 20 mmol/L (ref 20–29)
Calcium: 9.8 mg/dL (ref 8.6–10.2)
Chloride: 102 mmol/L (ref 96–106)
Creatinine, Ser: 0.96 mg/dL (ref 0.76–1.27)
Glucose: 101 mg/dL — AB (ref 70–99)
Potassium: 4.6 mmol/L (ref 3.5–5.2)
Sodium: 140 mmol/L (ref 134–144)
eGFR: 80 mL/min/1.73 (ref >59)

## 2023-11-10 LAB — VAS US ABI WITH/WO TBI
Left ABI: 1.12
Right ABI: 1.17

## 2023-11-12 NOTE — Addendum Note (Signed)
 Addended byBETHA FERRIER, GLENDIA T on: 11/12/2023 02:49 PM   Modules accepted: Orders

## 2023-11-24 ENCOUNTER — Ambulatory Visit (HOSPITAL_COMMUNITY)
Admission: RE | Admit: 2023-11-24 | Discharge: 2023-11-24 | Disposition: A | Discharge status: Home / Self Care | Source: Ambulatory Visit | Attending: Physician Assistant | Admitting: Physician Assistant

## 2023-11-24 ENCOUNTER — Ambulatory Visit: Payer: Self-pay | Admitting: Physician Assistant

## 2023-11-24 DIAGNOSIS — R6889 Other general symptoms and signs: Secondary | ICD-10-CM | POA: Diagnosis not present

## 2023-11-24 DIAGNOSIS — I739 Peripheral vascular disease, unspecified: Secondary | ICD-10-CM | POA: Insufficient documentation

## 2023-12-04 ENCOUNTER — Ambulatory Visit: Admission: EM | Admit: 2023-12-04 | Discharge: 2023-12-04 | Disposition: A | Discharge status: Home / Self Care

## 2023-12-04 DIAGNOSIS — W540XXA Bitten by dog, initial encounter: Secondary | ICD-10-CM

## 2023-12-04 DIAGNOSIS — S61259A Open bite of unspecified finger without damage to nail, initial encounter: Secondary | ICD-10-CM | POA: Diagnosis not present

## 2023-12-04 MED ORDER — MUPIROCIN 2 % EX OINT: 1.0000 | TOPICAL_OINTMENT | Freq: Two times a day (BID) | CUTANEOUS | 1 refills | Status: AC

## 2023-12-04 MED ORDER — AMOXICILLIN-POT CLAVULANATE 875-125 MG PO TABS: 1.0000 | ORAL_TABLET | Freq: Two times a day (BID) | ORAL | 0 refills | Status: AC

## 2023-12-04 NOTE — ED Triage Notes (Addendum)
 I was feeding my dog in [place] (where I live), he accidentally had bitten my finger when trying to feed him, up to date with vaccines, he is my inside pet. Concerned with infection 'Right index finger. Last Tdap: a few years ago (TETANUS/TDAP 08/01/2021), Per progress note (reviewed). DOI: 12-03-2023

## 2023-12-04 NOTE — ED Provider Notes (Signed)
 [facility]    CSN: [id number] Arrival date & time: 12/04/23  0806      History   Chief Complaint Chief Complaint  Patient presents with   Animal Bite    HPI Troy [name] is a 78 y.o. male.   78 year old male who presents urgent care with complaints of a dog bite to the right index finger.  This occurred yesterday.  This is his own personal dog that is not inside dog and up-to-date on vaccinations.  The patient is up-to-date on tetanus.  He reports that he cleaned the area extensively after it occurred.  He does have a family member that is medical and recommended that he come to urgent care if the area looked to be infected.  The patient denies any fevers or chills but is having swelling in the right index finger.   Animal Bite Associated symptoms: no fever and no rash     Past Medical History:  Diagnosis Date   ALLERGIC RHINITIS 05/04/2007   BACK PAIN, LUMBAR 02/17/2010   CAD (coronary artery disease) 10/27/2023   CCTA 03/07/2021: CAC score 888 (74th percentile), mild nonobstructive CAD [RCA proximal and mid 25-49, LM 20, LAD proximal and mid Ca2+, LCx Ca2+]    CHEST PAIN 07/15/2009   CONSTIPATION 02/17/2010   Heart murmur    remote hx of murmur   HYPERLIPIDEMIA 05/04/2007   HYPERTENSION 05/04/2007   Interstitial cystitis 08/30/2013   Lumbar disc disease 10/01/2011   NECK PAIN, ACUTE 07/15/2009   Nonrheumatic aortic (valve) stenosis 10/27/2023   TTE 05/26/2022: EF 57, no RWMA, mild LVH, GR 1 DD, normal RVSF, normal PASP, RVSP 23.4, mild MR, mild AV Ca2+, mild AS (mean gradient 10, V-max 216 cm/s, DI 0.55), RAP 3    Prostate enlargement    PSVT 05/04/2007   Skin cancer 08/30/2013   Basal cell x 2, dec 2014   Spermatocele    VERTIGO 05/04/2007    Patient Active Problem List   Diagnosis Date Noted   CAD (coronary artery disease) 10/27/2023   Nonrheumatic aortic (valve) stenosis 10/27/2023   Excessive cerumen in both ear canals 10/12/2023   Chronic  eczematous otitis externa of both ears 10/12/2023   Low back pain 03/01/2023   Chronic sinusitis 09/28/2022   Macular degeneration 09/25/2022   Right bundle branch block 03/03/2021   Vitamin D  deficiency 08/01/2020   B12 deficiency 08/01/2020   Tick bite, infected 08/11/2018   Hyperglycemia 09/30/2017   Chronic bucket handle tear of medial meniscus of right knee 05/18/2017   Interstitial cystitis 08/30/2013   Skin cancer 08/30/2013   Varicocele 10/04/2011   Lumbar disc disease 10/01/2011   Renal cyst 03/25/2011   Preventative health care 10/22/2010   Cervical spondylosis 10/22/2010   Bladder neck obstruction 10/22/2010   BACK PAIN, LUMBAR 02/17/2010   Chest pain of uncertain etiology 07/15/2009   Hyperlipidemia 05/04/2007   Essential hypertension 05/04/2007   PSVT 05/04/2007   Allergic rhinitis 05/04/2007    Past Surgical History:  Procedure Laterality Date   APPENDECTOMY     KNEE ARTHROSCOPY  30 yrs ago   s/p dental implant left upper jaw  11/09   also rt lower jaw   s/p RK surgery bilat     SPERMATOCELECTOMY  12/04/2011   Procedure: SPERMATOCELECTOMY;  Surgeon: Thomasine Oiler, MD;  Location: [facility];  Service: Urology;  Laterality: Left;  1 hour requested for this case    TONSILLECTOMY  Home Medications    Prior to Admission medications   Medication Sig Start Date End Date Taking? Authorizing Provider  amLODipine  (NORVASC ) 5 MG tablet Take 1 tablet (5 mg total) by mouth daily. 07/05/23 12/04/23 Yes Thukkani, Arun K, MD  amoxicillin -clavulanate (AUGMENTIN ) 875-125 MG tablet Take 1 tablet by mouth every 12 (twelve) hours for 10 days. 12/04/23 12/14/23 Yes Tyvion Edmondson A, PA-C  aspirin 81 MG EC tablet Take 81 mg by mouth daily.   Yes [provider]  carvedilol  (COREG ) 6.25 MG tablet Take 1 tablet (6.25 mg total) by mouth 2 (two) times daily. 10/21/23  Yes Thukkani, Arun K, MD  ezetimibe  (ZETIA ) 10 MG tablet Take 1 tablet (10 mg total) by  mouth daily. 04/16/23  Yes Thukkani, Arun K, MD  Fluocinolone Acetonide 0.01 % OIL INSTILL 4 DROPS INTO EACH EAR AT NIGHT FOR 14 DAYS THEN STOP AND USE AS NEEDED FOR EAR [facility] 10/12/23  Yes [provider]  irbesartan -hydrochlorothiazide  (AVALIDE) 300-12.5 MG tablet Take 1 tablet by mouth daily. 10/26/23  Yes Thukkani, Arun K, MD  mupirocin  ointment (BACTROBAN ) 2 % Apply 1 Application topically 2 (two) times daily. 12/04/23  Yes Lyndzee Kliebert A, PA-C  neomycin-bacitracin-polymyxin (NEOSPORIN) [phone] ointment Apply topically 4 (four) times daily.   Yes [provider]  rosuvastatin  (CRESTOR ) 10 MG tablet Take 1 tablet (10 mg total) by mouth daily. 11/08/23 02/06/24 Yes Thukkani, Arun K, MD  famotidine-calcium  carbonate-magnesium hydroxide (PEPCID COMPLETE) 10-800-165 MG chewable tablet Chew 1 tablet by mouth daily as needed.    [provider]  fluticasone  (FLONASE ) 50 MCG/ACT nasal spray 2 sprays. 09/30/17   [provider]  traMADol  (ULTRAM ) 50 MG tablet TAKE 1 TABLET BY MOUTH EVERY 6 HOURS AS NEEDED Patient taking differently: Take 50 mg by mouth daily. 10/27/23   Norleen Lynwood ORN, MD    Family History Family History  Problem Relation Age of Onset   Cancer Mother        breast cancer   Dementia Mother    Hypertension Mother    Hyperlipidemia Other    Diabetes Other    Heart disease Other     Social History Social History   Tobacco Use   Smoking status: Never   Smokeless tobacco: Never  Vaping Use   Vaping status: Never Used  Substance Use Topics   Alcohol use: No    Alcohol/week: 0.0 standard drinks of alcohol   Drug use: No     Allergies   Atorvastatin    Review of Systems Review of Systems  Constitutional:  Negative for chills and fever.  HENT:  Negative for ear pain and sore throat.   Eyes:  Negative for pain and visual disturbance.  Respiratory:  Negative for cough and shortness of breath.   Cardiovascular:  Negative for chest pain  and palpitations.  Gastrointestinal:  Negative for abdominal pain and vomiting.  Genitourinary:  Negative for dysuria and hematuria.  Musculoskeletal:  Negative for arthralgias and back pain.  Skin:  Positive for wound (right index finger). Negative for color change and rash.  Neurological:  Negative for seizures and syncope.  All other systems reviewed and are negative.    Physical Exam Triage Vital Signs ED Triage Vitals [12/04/23 0819]  Encounter Vitals Group     BP      Girls Systolic BP Percentile      Girls Diastolic BP Percentile      Boys Systolic BP Percentile      Boys Diastolic BP Percentile  Pulse      Resp      Temp      Temp src      SpO2      Weight 217 lb 2.5 oz (98.5 kg)     Height 6' 2.5 (1.892 m)     Head Circumference      Peak Flow      Pain Score 0     Pain Loc      Pain Education      Exclude from Growth Chart    No data found.  Updated Vital Signs BP 135/89 (BP Location: Left Arm)   Pulse 79   Temp 97.8 F (36.6 C) (Oral)   Resp 18   Ht 6' 2.5 (1.892 m)   Wt 217 lb 2.5 oz (98.5 kg)   SpO2 95%   BMI 27.51 kg/m   Visual Acuity Right Eye Distance:   Left Eye Distance:   Bilateral Distance:    Right Eye Near:   Left Eye Near:    Bilateral Near:     Physical Exam Vitals and nursing note reviewed.  Constitutional:      General: He is not in acute distress.    Appearance: He is well-developed.  HENT:     Head: Normocephalic and atraumatic.  Eyes:     Conjunctiva/sclera: Conjunctivae normal.  Cardiovascular:     Rate and Rhythm: Normal rate and regular rhythm.     Heart sounds: No murmur heard. Pulmonary:     Effort: Pulmonary effort is normal. No respiratory distress.     Breath sounds: Normal breath sounds.  Abdominal:     Palpations: Abdomen is soft.     Tenderness: There is no abdominal tenderness.  Musculoskeletal:        General: No swelling.     Cervical back: Neck supple.  Skin:    General: Skin is warm and  dry.     Capillary Refill: Capillary refill takes less than 2 seconds.     Comments: Right index finger with several superficial lacerations with edema to the right index finger and PIP  Neurological:     Mental Status: He is alert.  Psychiatric:        Mood and Affect: Mood normal.      UC Treatments / Results  Labs (all labs ordered are listed, but only abnormal results are displayed) Labs Reviewed - No data to display  EKG   Radiology No results found.  Procedures Procedures (including critical care time)  Medications Ordered in UC Medications - No data to display  Initial Impression / Assessment and Plan / UC Course  I have reviewed the triage vital signs and the nursing notes.  Pertinent labs & imaging results that were available during my care of the patient were reviewed by me and considered in my medical decision making (see chart for details).     Open wound of finger of right hand due to dog bite   Infected Wound to the right index finger secondary to dog bite. Dog is up to date and tetanus is up to date. We will treat with the following:  Augmentin  875 mg twice daily for 10 days.  This is an antibiotic.  Take this with food.  Mupirocin  to the open areas 2-3 times daily and if able cover with a dry dressing such as gauze.  Wash the area with soap and water at least twice daily Return to urgent care or PCP if symptoms worsen or fail  to resolve.    Final Clinical Impressions(s) / UC Diagnoses   Final diagnoses:  Open wound of finger of right hand due to dog bite     Discharge Instructions      Infected Wound to the right index finger secondary to dog bite. Dog is up to date and tetanus is up to date. We will treat with the following:  Augmentin  875 mg twice daily for 10 days.  This is an antibiotic.  Take this with food.  Mupirocin  to the open areas 2-3 times daily and if able cover with a dry dressing such as gauze.  Wash the area with soap and water  at least twice daily Return to urgent care or PCP if symptoms worsen or fail to resolve.       ED Prescriptions     Medication Sig Dispense Auth. Provider   amoxicillin -clavulanate (AUGMENTIN ) 875-125 MG tablet Take 1 tablet by mouth every 12 (twelve) hours for 10 days. 20 tablet Tajana Crotteau A, PA-C   mupirocin  ointment (BACTROBAN ) 2 % Apply 1 Application topically 2 (two) times daily. 22 g Teresa Almarie LABOR, [place]      PDMP not reviewed this encounter.   Teresa Almarie LABOR, [place] 12/04/23 [phone]

## 2023-12-04 NOTE — Discharge Instructions (Addendum)
 Infected Wound to the right index finger secondary to dog bite. Dog is up to date and tetanus is up to date. We will treat with the following:  Augmentin  875 mg twice daily for 10 days.  This is an antibiotic.  Take this with food.  Mupirocin  to the open areas 2-3 times daily and if able cover with a dry dressing such as gauze.  Wash the area with soap and water at least twice daily Return to urgent care or PCP if symptoms worsen or fail to resolve.

## 2024-01-01 ENCOUNTER — Emergency Department (HOSPITAL_COMMUNITY)
Admission: EM | Admit: 2024-01-01 | Discharge: 2024-01-02 | Disposition: A | Discharge status: Home / Self Care | Source: Home / Self Care | Attending: Emergency Medicine | Admitting: Emergency Medicine

## 2024-01-01 ENCOUNTER — Emergency Department (HOSPITAL_COMMUNITY)

## 2024-01-01 ENCOUNTER — Other Ambulatory Visit: Payer: Self-pay

## 2024-01-01 DIAGNOSIS — Z7982 Long term (current) use of aspirin: Secondary | ICD-10-CM | POA: Insufficient documentation

## 2024-01-01 DIAGNOSIS — W540XXA Bitten by dog, initial encounter: Secondary | ICD-10-CM | POA: Insufficient documentation

## 2024-01-01 DIAGNOSIS — S62633A Displaced fracture of distal phalanx of left middle finger, initial encounter for closed fracture: Secondary | ICD-10-CM | POA: Insufficient documentation

## 2024-01-01 DIAGNOSIS — I471 Supraventricular tachycardia, unspecified: Secondary | ICD-10-CM | POA: Diagnosis not present

## 2024-01-01 DIAGNOSIS — S62633B Displaced fracture of distal phalanx of left middle finger, initial encounter for open fracture: Secondary | ICD-10-CM | POA: Diagnosis not present

## 2024-01-01 DIAGNOSIS — I10 Essential (primary) hypertension: Secondary | ICD-10-CM | POA: Insufficient documentation

## 2024-01-01 DIAGNOSIS — S61213A Laceration without foreign body of left middle finger without damage to nail, initial encounter: Secondary | ICD-10-CM | POA: Diagnosis not present

## 2024-01-01 DIAGNOSIS — S62603A Fracture of unspecified phalanx of left middle finger, initial encounter for closed fracture: Secondary | ICD-10-CM | POA: Diagnosis not present

## 2024-01-01 DIAGNOSIS — Z79899 Other long term (current) drug therapy: Secondary | ICD-10-CM | POA: Insufficient documentation

## 2024-01-01 DIAGNOSIS — I2510 Atherosclerotic heart disease of native coronary artery without angina pectoris: Secondary | ICD-10-CM | POA: Diagnosis not present

## 2024-01-01 DIAGNOSIS — S62639B Displaced fracture of distal phalanx of unspecified finger, initial encounter for open fracture: Secondary | ICD-10-CM

## 2024-01-01 DIAGNOSIS — M1990 Unspecified osteoarthritis, unspecified site: Secondary | ICD-10-CM | POA: Diagnosis not present

## 2024-01-01 MED ORDER — MORPHINE SULFATE (PF) 4 MG/ML IV SOLN
4.0000 mg | Freq: Once | INTRAVENOUS | Status: AC
  Administered 2024-01-01: 4 mg via INTRAVENOUS
  Filled 2024-01-01: qty 1

## 2024-01-01 MED ORDER — OXYCODONE-ACETAMINOPHEN 5-325 MG PO TABS
1.0000 | ORAL_TABLET | ORAL | Status: DC | PRN
  Administered 2024-01-01: 1 via ORAL
  Filled 2024-01-01: qty 1

## 2024-01-01 MED ORDER — LIDOCAINE HCL (PF) 1 % IJ SOLN
10.0000 mL | Freq: Once | INTRAMUSCULAR | Status: AC
  Administered 2024-01-02: 10 mL
  Filled 2024-01-01: qty 10

## 2024-01-01 MED ORDER — SODIUM CHLORIDE 0.9 % IV SOLN
3.0000 g | Freq: Once | INTRAVENOUS | Status: AC
  Administered 2024-01-01: 3 g via INTRAVENOUS
  Filled 2024-01-01: qty 8

## 2024-01-01 NOTE — ED Triage Notes (Signed)
 PT states his daughter's pitbull almost bit of the tip of his pointer finger on his L hand.

## 2024-01-01 NOTE — ED Provider Notes (Signed)
 [place] EMERGENCY DEPARTMENT AT [facility] Provider Note   CSN: [id number] Arrival date & time: 01/01/24  2014     Patient presents with: Animal Bite   Troy [name] is a 78 y.o. male with history of hypertension, CAD presents to the emergency department today for evaluation after dog bite to the left middle finger.  This happened around 2000 today.  He reports that the pitbull bit his finger. Rabies vaccine up to date with the animal. Tetanus is up to date. He is RHD. NKDA.  Animal Bite      Prior to Admission medications   Medication Sig Start Date End Date Taking? Authorizing Provider  amLODipine  (NORVASC ) 5 MG tablet Take 1 tablet (5 mg total) by mouth daily. 07/05/23 12/04/23  Thukkani, Arun K, MD  aspirin 81 MG EC tablet Take 81 mg by mouth daily.    [provider]  carvedilol  (COREG ) 6.25 MG tablet Take 1 tablet (6.25 mg total) by mouth 2 (two) times daily. 10/21/23   Thukkani, Arun K, MD  ezetimibe  (ZETIA ) 10 MG tablet Take 1 tablet (10 mg total) by mouth daily. 04/16/23   Thukkani, Arun K, MD  famotidine-calcium  carbonate-magnesium hydroxide (PEPCID COMPLETE) 10-800-165 MG chewable tablet Chew 1 tablet by mouth daily as needed.    [provider]  Fluocinolone Acetonide 0.01 % OIL INSTILL 4 DROPS INTO EACH EAR AT NIGHT FOR 14 DAYS THEN STOP AND USE AS NEEDED FOR EAR [facility] 10/12/23   [provider]  fluticasone  (FLONASE ) 50 MCG/ACT nasal spray 2 sprays. 09/30/17   [provider]  irbesartan -hydrochlorothiazide  (AVALIDE) 300-12.5 MG tablet Take 1 tablet by mouth daily. 10/26/23   Thukkani, Arun K, MD  mupirocin  ointment (BACTROBAN ) 2 % Apply 1 Application topically 2 (two) times daily. 12/04/23   Teresa Norris A, PA-C  neomycin-bacitracin -polymyxin (NEOSPORIN) [id number] ointment Apply topically 4 (four) times daily.    [provider]  rosuvastatin  (CRESTOR ) 10 MG tablet Take 1 tablet (10 mg total) by mouth daily. 11/08/23  02/06/24  Thukkani, Arun K, MD  traMADol  (ULTRAM ) 50 MG tablet TAKE 1 TABLET BY MOUTH EVERY 6 HOURS AS NEEDED Patient taking differently: Take 50 mg by mouth daily. 10/27/23   Norleen Lynwood ORN, MD    Allergies: Atorvastatin     Review of Systems  Skin:  Positive for wound.  See HPI  Updated Vital Signs BP (!) 169/100 (BP Location: Right Arm)   Pulse 96   Temp 98.1 F (36.7 C)   Resp 20   Ht 6' 2 (1.88 m)   Wt 98.5 kg   SpO2 98%   BMI 27.88 kg/m   Physical Exam Vitals and nursing note reviewed.  Constitutional:      Appearance: He is not toxic-appearing.     Comments: Uncomfortable, but no acute distress  Eyes:     General: No scleral icterus. Cardiovascular:     Rate and Rhythm: Normal rate.  Pulmonary:     Effort: Pulmonary effort is normal. No respiratory distress.  Musculoskeletal:        General: Deformity and signs of injury present.     Comments: Laceration present just proximal to the nail bed of the left middle finger. Cap refill intact and sensation diminished, but still present, in the distal tip. Please see image. Small laceration and puncture seen to the thumb on the left thumb  Skin:    General: Skin is warm and dry.  Neurological:     Mental Status: He  is alert.     (all labs ordered are listed, but only abnormal results are displayed) Labs Reviewed - No data to display  EKG: None  Radiology: DG Finger Middle Left Result Date: 01/01/2024 CLINICAL DATA:  Dog bite EXAM: LEFT MIDDLE FINGER 2+V COMPARISON:  None Available. FINDINGS: There is an acute oblique fracture of the tuft of the distal third phalanx. Fracture fragments are displaced 4 mm distally. There is 1 shaft width ventral displacement of the distal fracture fragment as well. There is overlying soft tissue swelling and laceration as well as bandage. No definite radiopaque foreign body identified. No dislocation. IMPRESSION: Acute displaced fracture of the tuft of the distal third phalanx.  Electronically Signed   By: Greig Pique M.D.   On: 01/01/2024 21:33   Wound repair  Date/Time: 01/02/2024 12:46 AM  Performed by: Bernis Ernst, PA-C Authorized by: Bernis Ernst, PA-C  Consent: Verbal consent obtained Risks and benefits: risks, benefits and alternatives were discussed Consent given by: patient Patient understanding: patient states understanding of the procedure being performed Imaging studies: imaging studies available Patient identity confirmed: verbally with patient Preparation: Patient was prepped and draped in the usual sterile fashion. Local anesthesia used: yes Anesthesia: digital block  Anesthesia: Local anesthesia used: yes Local Anesthetic: lidocaine  1% without epinephrine  Anesthetic total: 6 mL Patient tolerance: patient tolerated the procedure well with no immediate complications Comments: Left middle finger digital block was placed.  Wound was copiously cleansed and irrigated with Betadine and 3 L of normal saline.  I placed a not adhesive dressing to the patient's finger as well as a small amount of cotton dressing and splint for placement for protection.   Wound repair  Date/Time: 01/02/2024 12:46 AM  Performed by: Bernis Ernst, PA-C Authorized by: Bernis Ernst, PA-C  Consent: Verbal consent obtained Risks and benefits: risks, benefits and alternatives were discussed Consent given by: patient Patient understanding: patient states understanding of the procedure being performed Patient tolerance: patient tolerated the procedure well with no immediate complications Comments: Wound was cleansed and thoroughly irrigated with betadine and normal saline. Appears superficial. No palpable foreign bodies. Full ROM.      Medications Ordered in the ED  oxyCODONE -acetaminophen  (PERCOCET/ROXICET) 5-325 MG per tablet 1 tablet (1 tablet Oral Given 01/01/24 2029)  Ampicillin -Sulbactam (UNASYN ) 3 g in sodium chloride  0.9 % 100 mL IVPB (has no administration in  time range)    Clinical Course as of 01/01/24 2221  Sat Jan 01, 2024  2214 Tetanus in 2023 [RR]    Clinical Course User Index [RR] Bernis Ernst, PA-C   Medical Decision Making Amount and/or Complexity of Data Reviewed Radiology: ordered.  Risk Prescription drug management.   78 y.o. male presents to the ER for evaluation of dog bite to finger. Differential diagnosis includes but is not limited to open fracture, dog bite, laceration, retained foreign body. Vital signs elevated BP, otherwise unremarkable. Physical exam as noted above.   XR imaging shows Acute displaced fracture of the tuft of the distal third phalanx. Per radiologist's interpretation.    Unasyn  ordered. Patient's Tdap up to date. Pain medication ordered. Hand surgery consulted for open fracture from dog bite.   I consulted hand surgery and spoke with Dr. Romona. He asks for the wound to be irrigated, cleaned, and bandaged and the patient will need to check back into the ER at 0900 for surgery at 1100. The patient was advised to remain NPO after midnight.   Please see procedure notes for wound care.  It was reiterated to the patient that he needs to remain NPO until after surgery. Him and his wife verbalize their understanding that they are to return to the ER here at 0900 for surgery at 1100 and they are to leave the wrapping on.   I have prescribed the patient Augmentin  for the dog bite and Keflex  concomitantly with this given new researched on high mechanism bite force.   We discussed the results of the labs/imaging. The plan is antibiotics, return for surgery at 0900. We discussed strict return precautions and red flag symptoms. The patient verbalized their understanding and agrees to the plan. The patient is stable and being discharged home in good condition.  Portions of this report may have been transcribed using voice recognition software. Every effort was made to ensure accuracy; however, inadvertent  computerized transcription errors may be present.    Final diagnoses:  Dog bite, initial encounter  Open fracture of tuft of distal phalanx of finger    ED Discharge Orders          Ordered    amoxicillin -clavulanate (AUGMENTIN ) 875-125 MG tablet  Every 12 hours        01/02/24 0033    oxyCODONE  (ROXICODONE ) 5 MG immediate release tablet  Every 4 hours PRN        01/02/24 0033    cephALEXin  (KEFLEX ) 500 MG capsule  4 times daily        01/02/24 0041               Bernis Ernst, PA-C 01/02/24 0053    Dasie Faden, MD 01/02/24 1546

## 2024-01-02 ENCOUNTER — Inpatient Hospital Stay (HOSPITAL_COMMUNITY)
Admission: RE | Admit: 2024-01-02 | Discharge: 2024-01-02 | Disposition: A | Discharge status: Home / Self Care | Attending: Orthopedic Surgery | Admitting: Orthopedic Surgery

## 2024-01-02 ENCOUNTER — Other Ambulatory Visit: Payer: Self-pay

## 2024-01-02 ENCOUNTER — Inpatient Hospital Stay (HOSPITAL_BASED_OUTPATIENT_CLINIC_OR_DEPARTMENT_OTHER): Admitting: Certified Registered Nurse Anesthetist

## 2024-01-02 ENCOUNTER — Encounter (HOSPITAL_COMMUNITY)
Admission: EM | Disposition: A | Discharge status: Home / Self Care | Payer: Self-pay | Source: Home / Self Care | Attending: Orthopedic Surgery

## 2024-01-02 ENCOUNTER — Inpatient Hospital Stay (HOSPITAL_COMMUNITY): Admitting: Certified Registered Nurse Anesthetist

## 2024-01-02 ENCOUNTER — Encounter (HOSPITAL_COMMUNITY): Payer: Self-pay | Admitting: Orthopedic Surgery

## 2024-01-02 DIAGNOSIS — W540XXA Bitten by dog, initial encounter: Secondary | ICD-10-CM | POA: Diagnosis not present

## 2024-01-02 DIAGNOSIS — S62633A Displaced fracture of distal phalanx of left middle finger, initial encounter for closed fracture: Secondary | ICD-10-CM | POA: Diagnosis not present

## 2024-01-02 DIAGNOSIS — I2510 Atherosclerotic heart disease of native coronary artery without angina pectoris: Secondary | ICD-10-CM

## 2024-01-02 DIAGNOSIS — S62633B Displaced fracture of distal phalanx of left middle finger, initial encounter for open fracture: Secondary | ICD-10-CM | POA: Diagnosis not present

## 2024-01-02 DIAGNOSIS — E785 Hyperlipidemia, unspecified: Secondary | ICD-10-CM | POA: Diagnosis not present

## 2024-01-02 DIAGNOSIS — M1990 Unspecified osteoarthritis, unspecified site: Secondary | ICD-10-CM | POA: Insufficient documentation

## 2024-01-02 DIAGNOSIS — K219 Gastro-esophageal reflux disease without esophagitis: Secondary | ICD-10-CM | POA: Diagnosis not present

## 2024-01-02 DIAGNOSIS — S61353A Open bite of left middle finger with damage to nail, initial encounter: Secondary | ICD-10-CM | POA: Diagnosis not present

## 2024-01-02 DIAGNOSIS — R011 Cardiac murmur, unspecified: Secondary | ICD-10-CM | POA: Diagnosis not present

## 2024-01-02 DIAGNOSIS — I471 Supraventricular tachycardia, unspecified: Secondary | ICD-10-CM | POA: Diagnosis not present

## 2024-01-02 DIAGNOSIS — S62633D Displaced fracture of distal phalanx of left middle finger, subsequent encounter for fracture with routine healing: Secondary | ICD-10-CM | POA: Diagnosis not present

## 2024-01-02 DIAGNOSIS — I10 Essential (primary) hypertension: Secondary | ICD-10-CM | POA: Insufficient documentation

## 2024-01-02 HISTORY — DX: Unspecified osteoarthritis, unspecified site: M19.90

## 2024-01-02 HISTORY — PX: INCISION AND DRAINAGE OF WOUND: SHX1803

## 2024-01-02 HISTORY — DX: Gastro-esophageal reflux disease without esophagitis: K21.9

## 2024-01-02 LAB — BASIC METABOLIC PANEL WITH GFR
Anion gap: 10 (ref 5–15)
BUN: 21 mg/dL (ref 8–23)
CO2: 24 mmol/L (ref 22–32)
Calcium: 9.6 mg/dL (ref 8.9–10.3)
Chloride: 102 mmol/L (ref 98–111)
Creatinine, Ser: 0.82 mg/dL (ref 0.61–1.24)
GFR, Estimated: >60 mL/min (ref >60)
Glucose, Bld: 121 mg/dL — ABNORMAL HIGH (ref 70–99)
Potassium: 3.7 mmol/L (ref 3.5–5.1)
Sodium: 136 mmol/L (ref 135–145)

## 2024-01-02 SURGERY — IRRIGATION AND DEBRIDEMENT WOUND
Anesthesia: General | Laterality: Left

## 2024-01-02 MED ORDER — OXYCODONE HCL 5 MG PO TABS: 5.0000 mg | ORAL_TABLET | Freq: Four times a day (QID) | ORAL | 0 refills | Status: AC | PRN

## 2024-01-02 MED ORDER — CEFAZOLIN SODIUM-DEXTROSE 2-3 GM-%(50ML) IV SOLR
INTRAVENOUS | Status: DC | PRN
Start: 2024-01-02 — End: 2024-01-02
  Administered 2024-01-02: 2 g via INTRAVENOUS

## 2024-01-02 MED ORDER — BACITRACIN ZINC 500 UNIT/GM EX OINT
TOPICAL_OINTMENT | CUTANEOUS | Status: AC
  Filled 2024-01-02: qty 28.35

## 2024-01-02 MED ORDER — ACETAMINOPHEN 10 MG/ML IV SOLN
1000.0000 mg | Freq: Once | INTRAVENOUS | Status: DC | PRN
Start: 2024-01-02 — End: 2024-01-03

## 2024-01-02 MED ORDER — 0.9 % SODIUM CHLORIDE (POUR BTL) OPTIME
TOPICAL | Status: DC | PRN
Start: 2024-01-02 — End: 2024-01-02
  Administered 2024-01-02: 1000 mL

## 2024-01-02 MED ORDER — AMOXICILLIN-POT CLAVULANATE 875-125 MG PO TABS: 1.0000 | ORAL_TABLET | Freq: Two times a day (BID) | ORAL | 0 refills | Status: DC

## 2024-01-02 MED ORDER — CHLORHEXIDINE GLUCONATE 0.12 % MT SOLN
OROMUCOSAL | Status: AC
  Filled 2024-01-02: qty 15

## 2024-01-02 MED ORDER — OXYCODONE HCL 5 MG/5ML PO SOLN: 5.0000 mg | Freq: Once | ORAL | Status: DC | PRN

## 2024-01-02 MED ORDER — CEPHALEXIN 500 MG PO CAPS
500.0000 mg | ORAL_CAPSULE | Freq: Four times a day (QID) | ORAL | 0 refills | Status: AC
Start: 2024-01-02 — End: 2024-01-09

## 2024-01-02 MED ORDER — OXYCODONE HCL 5 MG PO TABS: 5.0000 mg | ORAL_TABLET | Freq: Once | ORAL | Status: DC | PRN

## 2024-01-02 MED ORDER — OXYCODONE HCL 5 MG PO TABS: 5.0000 mg | ORAL_TABLET | ORAL | 0 refills | Status: DC | PRN

## 2024-01-02 MED ORDER — EPHEDRINE SULFATE-NACL 50-0.9 MG/10ML-% IV SOSY
PREFILLED_SYRINGE | INTRAVENOUS | Status: DC | PRN
  Administered 2024-01-02 (×7): 2.5 mg via INTRAVENOUS

## 2024-01-02 MED ORDER — FENTANYL CITRATE (PF) 100 MCG/2ML IJ SOLN: 25.0000 ug | INTRAMUSCULAR | Status: DC | PRN

## 2024-01-02 MED ORDER — PROPOFOL 10 MG/ML IV BOLUS
INTRAVENOUS | Status: AC
  Filled 2024-01-02: qty 20

## 2024-01-02 MED ORDER — PHENYLEPHRINE HCL-NACL 20-0.9 MG/250ML-% IV SOLN
INTRAVENOUS | Status: DC | PRN
Start: 2024-01-02 — End: 2024-01-02
  Administered 2024-01-02: 25 ug/min via INTRAVENOUS

## 2024-01-02 MED ORDER — PHENYLEPHRINE 80 MCG/ML (10ML) SYRINGE FOR IV PUSH (FOR BLOOD PRESSURE SUPPORT)
PREFILLED_SYRINGE | INTRAVENOUS | Status: DC | PRN
  Administered 2024-01-02: 240 ug via INTRAVENOUS
  Administered 2024-01-02 (×4): 160 ug via INTRAVENOUS
  Administered 2024-01-02: 80 ug via INTRAVENOUS

## 2024-01-02 MED ORDER — ONDANSETRON HCL 4 MG/2ML IJ SOLN
INTRAMUSCULAR | Status: DC | PRN
  Administered 2024-01-02: 4 mg via INTRAVENOUS

## 2024-01-02 MED ORDER — BUPIVACAINE HCL (PF) 0.25 % IJ SOLN
INTRAMUSCULAR | Status: AC
  Filled 2024-01-02: qty 30

## 2024-01-02 MED ORDER — ORAL CARE MOUTH RINSE: 15.0000 mL | Freq: Once | OROMUCOSAL | Status: AC

## 2024-01-02 MED ORDER — LACTATED RINGERS IV SOLN: INTRAVENOUS | Status: DC

## 2024-01-02 MED ORDER — PROPOFOL 10 MG/ML IV BOLUS
INTRAVENOUS | Status: DC | PRN
  Administered 2024-01-02: 50 mg via INTRAVENOUS
  Administered 2024-01-02: 150 mg via INTRAVENOUS

## 2024-01-02 MED ORDER — BUPIVACAINE-EPINEPHRINE (PF) 0.25% -1:200000 IJ SOLN
INTRAMUSCULAR | Status: DC | PRN
Start: 2024-01-02 — End: 2024-01-02
  Administered 2024-01-02: 10 mL

## 2024-01-02 MED ORDER — CHLORHEXIDINE GLUCONATE 0.12 % MT SOLN
15.0000 mL | Freq: Once | OROMUCOSAL | Status: AC
  Administered 2024-01-02: 15 mL via OROMUCOSAL

## 2024-01-02 MED ORDER — FENTANYL CITRATE (PF) 250 MCG/5ML IJ SOLN
INTRAMUSCULAR | Status: AC
Start: 2024-01-02 — End: 2024-01-02
  Filled 2024-01-02: qty 5

## 2024-01-02 MED ORDER — CEFAZOLIN SODIUM-DEXTROSE 2-4 GM/100ML-% IV SOLN
INTRAVENOUS | Status: AC
  Filled 2024-01-02: qty 100

## 2024-01-02 MED ORDER — DROPERIDOL 2.5 MG/ML IJ SOLN: 0.6250 mg | Freq: Once | INTRAMUSCULAR | Status: DC | PRN

## 2024-01-02 MED ORDER — FENTANYL CITRATE (PF) 250 MCG/5ML IJ SOLN
INTRAMUSCULAR | Status: DC | PRN
  Administered 2024-01-02: 50 ug via INTRAVENOUS
  Administered 2024-01-02 (×2): 25 ug via INTRAVENOUS
  Administered 2024-01-02: 50 ug via INTRAVENOUS

## 2024-01-02 MED ORDER — OXYCODONE-ACETAMINOPHEN 5-325 MG PO TABS
1.0000 | ORAL_TABLET | Freq: Once | ORAL | Status: AC
  Administered 2024-01-02: 1 via ORAL
  Filled 2024-01-02: qty 1

## 2024-01-02 MED ORDER — BACITRACIN ZINC 500 UNIT/GM EX OINT
TOPICAL_OINTMENT | CUTANEOUS | Status: DC | PRN
  Administered 2024-01-02: 1 via TOPICAL

## 2024-01-02 MED ORDER — DEXAMETHASONE SODIUM PHOSPHATE 10 MG/ML IJ SOLN
INTRAMUSCULAR | Status: DC | PRN
  Administered 2024-01-02: 4 mg via INTRAVENOUS

## 2024-01-02 MED ORDER — LIDOCAINE 2% (20 MG/ML) 5 ML SYRINGE
INTRAMUSCULAR | Status: DC | PRN
  Administered 2024-01-02: 100 mg via INTRAVENOUS

## 2024-01-02 SURGICAL SUPPLY — 57 items
BAG COUNTER SPONGE SURGICOUNT (BAG) ×1 IMPLANT
BNDG COHESIVE 1X5 TAN STRL LF (GAUZE/BANDAGES/DRESSINGS) IMPLANT
BNDG COMPR ESMARK 4X3 LF (GAUZE/BANDAGES/DRESSINGS) IMPLANT
BNDG ELASTIC 2X5.8 VLCR STR LF (GAUZE/BANDAGES/DRESSINGS) ×1 IMPLANT
BNDG ELASTIC 3INX 5YD STR LF (GAUZE/BANDAGES/DRESSINGS) ×1 IMPLANT
BNDG ELASTIC 4X5.8 VLCR STR LF (GAUZE/BANDAGES/DRESSINGS) ×1 IMPLANT
BNDG GAUZE DERMACEA FLUFF 4 (GAUZE/BANDAGES/DRESSINGS) ×2 IMPLANT
CHLORAPREP W/TINT 26 (MISCELLANEOUS) ×1 IMPLANT
CORD BIPOLAR FORCEPS 12FT (ELECTRODE) ×1 IMPLANT
COVER BACK TABLE 60X90IN (DRAPES) IMPLANT
COVER MAYO STAND STRL (DRAPES) ×1 IMPLANT
COVER SURGICAL LIGHT HANDLE (MISCELLANEOUS) ×1 IMPLANT
CUFF TOURN SGL QUICK 18X4 (TOURNIQUET CUFF) ×1 IMPLANT
CUFF TRNQT CYL 24X4X16.5-23 (TOURNIQUET CUFF) IMPLANT
DRAIN PENROSE 18X1/4 LTX STRL (DRAIN) IMPLANT
DRAPE HALF SHEET 40X57 (DRAPES) ×1 IMPLANT
DRAPE OEC MINIVIEW 54X84 (DRAPES) IMPLANT
DRAPE SURG 17X23 STRL (DRAPES) ×1 IMPLANT
DRSG ADAPTIC 3X8 NADH LF (GAUZE/BANDAGES/DRESSINGS) ×1 IMPLANT
GAUZE SPONGE 4X4 12PLY STRL (GAUZE/BANDAGES/DRESSINGS) ×1 IMPLANT
GAUZE STRETCH 2X75IN STRL (MISCELLANEOUS) IMPLANT
GAUZE XEROFORM 1X8 LF (GAUZE/BANDAGES/DRESSINGS) ×1 IMPLANT
GLOVE BIO SURGEON STRL SZ7 (GLOVE) ×1 IMPLANT
GLOVE BIOGEL PI IND STRL 7.0 (GLOVE) ×1 IMPLANT
GOWN STRL REUS W/ TWL XL LVL3 (GOWN DISPOSABLE) ×2 IMPLANT
IV CATH 18G X1.75 CATHLON (IV SOLUTION) IMPLANT
KIT BASIN OR (CUSTOM PROCEDURE TRAY) ×1 IMPLANT
KIT TURNOVER KIT B (KITS) ×1 IMPLANT
KWIRE DBL TROCAR .045X4 (WIRE) IMPLANT
LOOP VASCLR MAXI BLUE 18IN ST (MISCELLANEOUS) IMPLANT
LOOPS VASCLR MAXI BLUE 18IN ST (MISCELLANEOUS) IMPLANT
MANIFOLD NEPTUNE II (INSTRUMENTS) IMPLANT
NDL HYPO 25GX1X1/2 BEV (NEEDLE) IMPLANT
NDL HYPO 25X1 1.5 SAFETY (NEEDLE) ×1 IMPLANT
NDL KEITH (NEEDLE) IMPLANT
NEEDLE HYPO 25GX1X1/2 BEV (NEEDLE) IMPLANT
NEEDLE HYPO 25X1 1.5 SAFETY (NEEDLE) ×1 IMPLANT
NEEDLE KEITH (NEEDLE) IMPLANT
NS IRRIG 1000ML POUR BTL (IV SOLUTION) ×1 IMPLANT
PACK ORTHO EXTREMITY (CUSTOM PROCEDURE TRAY) ×1 IMPLANT
PAD ABD 8X10 STRL (GAUZE/BANDAGES/DRESSINGS) ×1 IMPLANT
PAD ARMBOARD POSITIONER FOAM (MISCELLANEOUS) ×1 IMPLANT
PAD CAST 4YDX4 CTTN HI CHSV (CAST SUPPLIES) ×2 IMPLANT
PADDING CAST ABS COTTON 3X4 (CAST SUPPLIES) ×1 IMPLANT
SPLINT PLASTER CAST XFAST 3X15 (CAST SUPPLIES) ×1 IMPLANT
SPONGE T-LAP 4X18 ~~LOC~~+RFID (SPONGE) ×1 IMPLANT
SUT CHROMIC 6 0 G 1 (SUTURE) IMPLANT
SUTURE FIBERWR 3-0 18 TAPR NDL (SUTURE) IMPLANT
SWAB CULTURE ESWAB REG 1ML (MISCELLANEOUS) IMPLANT
SYR BULB EAR ULCER 3OZ GRN STR (SYRINGE) ×1 IMPLANT
SYR CONTROL 10ML LL (SYRINGE) IMPLANT
TOWEL GREEN STERILE (TOWEL DISPOSABLE) ×1 IMPLANT
TOWEL GREEN STERILE FF (TOWEL DISPOSABLE) ×2 IMPLANT
TUBE CONNECTING 12X1/4 (SUCTIONS) IMPLANT
UNDERPAD 30X36 HEAVY ABSORB (UNDERPADS AND DIAPERS) ×1 IMPLANT
WATER STERILE IRR 1000ML POUR (IV SOLUTION) ×1 IMPLANT
YANKAUER SUCT BULB TIP NO VENT (SUCTIONS) IMPLANT

## 2024-01-02 NOTE — H&P (Signed)
 HAND SURGERY   HPI: Patient is a 78 y.o. Troy [name] [name] who presents with a dog bite to the left middle finger tip that occurred last night.  He was seen in the ER where the wound was irrigated and dressed.  There is a soft tissue injury involving the area around the nail apparatus with underlying distal phalanx fracture.  He describes mild pain in the left middle finger tip but denies pain elsewhere in the hand.  He presents today for surgical management of his injury.  Patient denies any changes to their medical history or new systemic symptoms today.    Past Medical History:  Diagnosis Date   ALLERGIC RHINITIS Troy [name]/31/2008   Arthritis    BACK PAIN, LUMBAR 02/17/2010   CAD (coronary artery disease) 10/27/2023   CCTA 03/07/2021: CAC score 888 (74th percentile), mild nonobstructive CAD [RCA proximal and mid 25-49, LM 20, LAD proximal and mid Ca2+, LCx Ca2+]    CHEST PAIN 07/15/2009   CONSTIPATION 02/17/2010   GERD (gastroesophageal reflux disease)    Heart murmur    remote hx of murmur   HYPERLIPIDEMIA Troy [name]/31/2008   HYPERTENSION Troy [name]/31/2008   Interstitial cystitis 08/30/2013   Lumbar disc disease 10/01/2011   NECK PAIN, ACUTE 07/15/2009   Nonrheumatic aortic (valve) stenosis 10/27/2023   TTE 05/26/2022: EF 57, no RWMA, mild LVH, GR 1 DD, normal RVSF, normal PASP, RVSP 23.4, mild MR, mild AV Ca2+, mild AS (mean gradient 10, V-max 216 cm/s, DI 0.55), RAP 3    Prostate enlargement    PSVT Troy [name]/31/2008   Skin cancer 08/30/2013   Basal cell x 2, dec 2014   Spermatocele    VERTIGO Troy [name]/31/2008   Past Surgical History:  Procedure Laterality Date   APPENDECTOMY     KNEE ARTHROSCOPY  30 yrs ago   s/p dental implant left upper jaw  11/09   also rt lower jaw   s/p RK surgery bilat     SPERMATOCELECTOMY  12/04/2011   Procedure: SPERMATOCELECTOMY;  Surgeon: Thomasine Oiler, MD;  Location: [facility] [place];  Service: Urology;  Laterality: Left;  1 hour requested for this case    TONSILLECTOMY      Social History   Socioeconomic History   Marital status: Married    Spouse name: Not on file   Number of children: 1   Years of education: Not on file   Highest education level: Bachelor's degree (e.g., BA, AB, BS)  Occupational History   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  Tobacco Use   Smoking status: Never   Smokeless tobacco: Never  Vaping Use   Vaping status: Never Used  Substance and Sexual Activity   Alcohol use: No    Alcohol/week: 0.0 standard drinks of alcohol   Drug use: No   Sexual activity: Not Currently  Other Topics Concern   Not on file  Social History Narrative   Not on file   Social Drivers of Health   Financial Resource Strain: Low Risk  (09/24/2022)   Overall Financial Resource Strain (CARDIA)    Difficulty of Paying Living Expenses: Not hard at all  Food Insecurity: Low Risk  (10/12/2023)   Received from [facility]   Hunger Vital Sign    Within the past Troy [name] months, you worried that your food would run out before you got money to buy more: Never true    Within the past Troy [name] months, the food you bought just didn't last and you didn't have money to  get more. : Never true  Transportation Needs: No Transportation Needs (10/12/2023)   Received from [facility]    In the past Troy [name] months, has lack of reliable transportation kept you from medical appointments, meetings, work or from getting things needed for daily living? : No  Physical Activity: Sufficiently Active (09/24/2022)   Exercise Vital Sign    Days of Exercise per Week: 7 days    Minutes of Exercise per Session: 30 min  Stress: No Stress Concern Present (09/24/2022)   [employer] of Occupational Health - Occupational Stress Questionnaire    Feeling of Stress : Only a little  Social Connections: Socially Integrated (09/24/2022)   Social Connection and Isolation Panel    Frequency of Communication with Friends and Family: Not on file    Frequency  of Social Gatherings with Friends and Family: More than three times a week    Attends Religious Services: 1 to 4 times per year    Active Member of [employer] or Organizations: Yes    Attends Banker Meetings: 1 to 4 times per year    Marital Status: Married   Family History  Problem Relation Age of Onset   Cancer Mother        breast cancer   Dementia Mother    Hypertension Mother    Hyperlipidemia Other    Diabetes Other    Heart disease Other    - negative except otherwise stated in the family history section Allergies  Allergen Reactions   Lipitor [Atorvastatin ] Other (See Comments)    Myopathy  Arthralgias Myalgias    Prior to Admission medications   Medication Sig Start Date End Date Taking? Authorizing Provider  acetaminophen  (TYLENOL ) 500 MG tablet Take 500 mg by mouth daily.   Yes [provider]  amLODipine  (NORVASC ) 5 MG tablet Take 1 tablet (5 mg total) by mouth daily. 07/05/23 02/12/24 Yes Thukkani, Arun K, MD  carvedilol  (COREG ) 6.25 MG tablet Take 1 tablet (6.25 mg total) by mouth 2 (two) times daily. 10/21/23  Yes Thukkani, Arun K, MD  Cholecalciferol (VITAMIN D -3 PO) Take 1 capsule by mouth daily.   Yes [provider]  ezetimibe  (ZETIA ) 10 MG tablet Take 1 tablet (10 mg total) by mouth daily. Patient taking differently: Take 10 mg by mouth at bedtime. Troy [name]/13/24  Yes Thukkani, Arun K, MD  Fluocinolone Acetonide 0.01 % OIL Place 1 application  into both ears at bedtime as needed (ear itching, eczema). OTIC oil 10/12/23  Yes [provider]  fluticasone  (FLONASE ) 50 MCG/ACT nasal spray Place 1-2 sprays into both nostrils daily as needed for allergies. 09/30/17  Yes [provider]  irbesartan -hydrochlorothiazide  (AVALIDE) 300-Troy [name].5 MG tablet Take 1 tablet by mouth daily. 10/26/23  Yes Thukkani, Arun K, MD  Multiple Vitamins-Minerals (PRESERVISION AREDS 2) CAPS Take 1 capsule by mouth 2 (two) times daily.   Yes [provider]  mupirocin  ointment (BACTROBAN ) 2 % Apply 1 Application topically 2 (two) times daily. Patient taking differently: Apply 1 Application topically 2 (two) times daily as needed (wound care). 12/04/23  Yes White, Elizabeth A, PA-C  neomycin-bacitracin -polymyxin (NEOSPORIN) [phone] ointment Apply 1 Application topically 4 (four) times daily as needed (wound care).   Yes [provider]  rosuvastatin  (CRESTOR ) 10 MG tablet Take 1 tablet (10 mg total) by mouth daily. Patient taking differently: Take 5 mg by mouth See admin instructions. Take 1/2 tablet (5mg ) by mouth every other night. 11/08/23 02/06/24 Yes Thukkani, Arun  K, MD  traMADol  (ULTRAM ) 50 MG tablet Take 50 mg by mouth daily.   Yes [provider]  amoxicillin -clavulanate (AUGMENTIN ) 875-125 MG tablet Take 1 tablet by mouth every Troy [name] (twelve) hours. Patient not taking: Reported on 01/02/2024 01/02/24   Bernis Ernst, PA-C  aspirin 81 MG EC tablet Take 81 mg by mouth daily. Patient not taking: Reported on 01/02/2024    [provider]  cephALEXin  (KEFLEX ) 500 MG capsule Take 1 capsule (500 mg total) by mouth 4 (four) times daily for 7 days. Patient not taking: Reported on 01/02/2024 01/02/24 01/09/24  Bernis Ernst, PA-C  oxyCODONE  (ROXICODONE ) 5 MG immediate release tablet Take 1 tablet (5 mg total) by mouth every 4 (four) hours as needed for severe pain (pain score 7-10). Patient not taking: Reported on 01/02/2024 01/02/24   Bernis Ernst, PA-C   DG Finger Middle Left Result Date: 01/01/2024 CLINICAL DATA:  Dog bite EXAM: LEFT MIDDLE FINGER 2+V COMPARISON:  None Available. FINDINGS: There is an acute oblique fracture of the tuft of the distal third phalanx. Fracture fragments are displaced 4 mm distally. There is 1 shaft width ventral displacement of the distal fracture fragment as well. There is overlying soft tissue swelling and laceration as well as bandage. No definite radiopaque foreign body identified. No dislocation.  IMPRESSION: Acute displaced fracture of the tuft of the distal third phalanx. Electronically Signed   By: Greig Pique Troy [name] [name].D.   On: 01/01/2024 21:33   - Positive ROS: All other systems have been reviewed and were otherwise negative with the exception of those mentioned in the HPI and as above.  Physical Exam: General: No acute distress, resting comfortably Cardiovascular: BUE warm and well perfused, normal rate Respiratory: Normal WOB on RA Skin: Warm and dry Neurologic: Sensation intact distally Psychiatric: Patient is at baseline mood and affect  Left Upper Extremity  Dressing over the middle finger tip is clean and dry.  No pain w/ AROM of middle finger at MCP and PIP joints.  Full and painless AROM of remaining fingers.  Remaining fingers pink and well perfused.    Assessment: Troy [name] Troy [name] [name] w/ dog bite injury to left middle finger tip with soft tissue injury and underlying distal phalanx fracture  Plan: We reviewed the nature of his injury at length.  OR today for irrigation and debridement of the wound and fracture with soft tissue repair, including the nail bed, and possible open reduction and percutaneous pinning.  We also discussed potential need for revision amputation. We again reviewed the risks of surgery which include bleeding, infection, damage to neurovascular structures, persistent symptoms, nonunion, malunion, nail abnormalities, cold intolerance, finger tip sensitivity, need for additional surgery.  Informed consent was signed.  All questions were answered.   Bebe Galla, Troy [name] [name].D. [facility] 11:58 AM

## 2024-01-02 NOTE — Anesthesia Procedure Notes (Signed)
 Procedure Name: LMA Insertion Date/Time: 01/02/2024 12:23 PM  Performed by: Jama Powell NOVAK, CRNAPre-anesthesia Checklist: Patient identified, Timeout performed, Emergency Drugs available, Suction available and Patient being monitored Patient Re-evaluated:Patient Re-evaluated prior to induction Oxygen Delivery Method: Circle system utilized Preoxygenation: Pre-oxygenation with 100% oxygen Induction Type: IV induction LMA: LMA inserted LMA Size: 5.0 Number of attempts: 1 Placement Confirmation: positive ETCO2, CO2 detector and breath sounds checked- equal and bilateral Tube secured with: Tape Dental Injury: Teeth and Oropharynx as per pre-operative assessment

## 2024-01-02 NOTE — Transfer of Care (Signed)
 Immediate Anesthesia Transfer of Care Note  Patient: Troy [name]  Procedure(s) Performed: IRRIGATION AND DEBRIDEMENT WOUND (Left)  Patient Location: PACU  Anesthesia Type:General  Level of Consciousness: awake and alert   Airway & Oxygen Therapy: Patient Spontanous Breathing and Patient connected to nasal cannula oxygen  Post-op Assessment: Report given to RN and Post -op Vital signs reviewed and stable  Post vital signs: Reviewed and stable  Last Vitals:  Vitals Value Taken Time  BP 148/87 01/02/24 13:25  Temp 36.6 C 01/02/24 13:25  Pulse 84 01/02/24 13:28  Resp 11 01/02/24 13:28  SpO2 94 % 01/02/24 13:28  Vitals shown include unfiled device data.  Last Pain:  Vitals:   01/02/24 1117  TempSrc: Oral  PainSc: 0-No pain         Complications: No notable events documented.

## 2024-01-02 NOTE — Op Note (Signed)
 Date of Surgery: 01/02/2024  INDICATIONS: Patient is a 78 y.o.-year-old male with a dog bite to the left middle finger tip with associated distal phalangeal tuft fracture and nail bed injury.  He was seen in the ER last night where the wound was irrigated and dressed.  He presents today for surgery.  Risks, benefits, and alternatives to surgery were again discussed with the patient in the preoperative area. The patient wishes to proceed with surgery.  Informed consent was signed after our discussion.   PREOPERATIVE DIAGNOSIS:  Left middle finger distal phalanx fracture Left middle finger nail bed laceration  POSTOPERATIVE DIAGNOSIS: Same.  PROCEDURE:  1.  Irrigation and debridement of open left middle finger distal phalanx fracture including skin, subcutaneous tissue, and bone 2.  Open treatment of left middle finger distal phalanx fracture 3.  Avulsion of nail plate 4.  Left middle finger nail bed repair   SURGEON: Carlin Galla, M.D.  ASSIST: None  ANESTHESIA:  general, local  IV FLUIDS AND URINE: See anesthesia.  ESTIMATED BLOOD LOSS: <5 mL.  IMPLANTS: * No implants in log *   DRAINS: None  COMPLICATIONS: None  DESCRIPTION OF PROCEDURE: The patient was met in the preoperative holding area where the surgical site was marked and the consent form was signed.  The patient was then taken to the operating room and transferred to the operating table.  All bony prominences were well padded.  General endotracheal anesthesia was induced.  The operative extremity was prepped and draped in the usual and sterile fashion.  A formal time-out was performed to confirm that this was the correct patient, surgery, side, and site.   Following formal timeout, a finger tourniquet was placed over the finger and rolled to the finger base.  The wound was inspected.  There is a transverse laceration to the nailbed with an underlying distal phalanx fracture.  There was abundant soft tissue and clot  within the wound bed.  The nail plate had avulsed from the proximal nail fold.  Some of the proximal nail plate was missing.  The nail was a avulsed atraumatically using a freer elevator.  The wound was then thoroughly irrigated.  I used a small house curette to debride all clot and subcutaneous tissue.  The curette was used to debride the bone at the site of the open fracture.  The wound was thoroughly irrigated once more.  Following thorough irrigation and debridement, the soft tissue lacerations at the radial and ulnar borders of the finger were closed using a 4-0 Vicryl Rapide suture.  I considered percutaneous pinning of the fracture, however, I have decided against this as the fracture was quite distal and appeared to be stable following soft tissue repair.  The nailbed was then repaired in simple fashion using a 6-0 chromic suture.  The nailbed tissue reapproximated nicely.  Live fluoroscopic exam showed that the fracture was adequately reduced.  It was much more stable now that the soft tissue had been repaired.  The wound was then cleaned and dressed.  A small piece of Adaptic was trimmed and placed under the proximal nail fold.  This was followed by bacitracin  ointment and a piece of Xeroform.  The tourniquet was removed.  The wound was further dressed with 4 x 4 gauze, Kling wrap,  AlumaFoam splint, and loose 1 inch Coban.  A digital block was performed using 10 cc of quarter percent plain Marcaine .  The patient was reversed from anesthesia and extubated uneventfully.  They were  transferred from the operating table to the postoperative bed.  All counts were correct x 2 at the end of the procedure.  The patient was then taken to the PACU in stable condition.   POSTOPERATIVE PLAN: He will be discharged home with appropriate pain medication and antibiotics.  I will see him back in the office in 10 to 14 days for his first follow-up visit.  A referral will be placed to hand therapy.  Carlin Galla,  MD 1:20 PM

## 2024-01-02 NOTE — Discharge Instructions (Addendum)
 You were seen in the emerged from today for evaluation after the dog bite.  You will need to return to this emergency department at 0900 on 01-02-2024 (today) for surgery. DO NOT EAT OR DRINK ANYTHING TILL AFTER YOUR SURGERY!! Do not take off your bandages or get them wet. I have sent in your antibiotics and pain medication in. For pain, I recommend taking 1000mg  of Tylenol  and 600mg  of ibuprofen every 6 hours as needed for pain. I have sent in some narcotic pain medication to take for breakthrough pain. These can make you sleepy, so please do not drive or operate heavy machinery while on this medication. You will need to take an antibiotic for the next 10 days.   Contact a doctor if: You have more redness, swelling, or pain around your wound. Your wound feels warm to the touch. You have a fever or chills. You have a general feeling of sickness (malaise). You feel like you may vomit. You vomit. You have pain that does not get better. Get help right away if: You have a red streak going away from your wound. You have any of these coming from your wound: Non-clear fluid. More blood. Pus or a bad smell. You have trouble moving your injured area. You lose feeling (have numbness) or feel tingling anywhere on your body.

## 2024-01-02 NOTE — Discharge Instructions (Signed)
 Carlin Galla, M.D. Hand Surgery  POST-OPERATIVE DISCHARGE INSTRUCTIONS   PRESCRIPTIONS: You may have been given a prescription to be taken as directed for post-operative pain control.  You may also take over the counter ibuprofen/aleve and tylenol  for pain. Take this as directed on the packaging. Do not exceed 3000 mg tylenol /acetaminophen  in 24 hours.  Ibuprofen 600-800 mg (3-4) tablets by mouth every 6 hours as needed for pain.  OR Aleve 2 tablets by mouth every 12 hours (twice daily) as needed for pain.  AND/OR Tylenol  1000 mg (2 tablets) every 8 hours as needed for pain.  Please use your pain medication carefully, as refills are limited and you may not be provided with one.  As stated above, please use over the counter pain medicine - it will also be helpful with decreasing your swelling.    ANESTHESIA: After your surgery, post-surgical discomfort or pain is likely. This discomfort can last several days to a few weeks. At certain times of the day your discomfort may be more intense.   Did you receive a nerve block?  A nerve block can provide pain relief for one hour to two days after your surgery. As long as the nerve block is working, you will experience little or no sensation in the area the surgeon operated on.  As the nerve block wears off, you will begin to experience pain or discomfort. It is very important that you begin taking your prescribed pain medication before the nerve block fully wears off. Treating your pain at the first sign of the block wearing off will ensure your pain is better controlled and more tolerable when full-sensation returns. Do not wait until the pain is intolerable, as the medicine will be less effective. It is better to treat pain in advance than to try and catch up.   General Anesthesia:  If you did not receive a nerve block during your surgery, you will need to start taking your pain medication shortly after your surgery and should continue  to do so as prescribed by your surgeon.     ICE AND ELEVATION: You may use ice for the first 48-72 hours, but it is not critical.   Motion of your fingers is very important to decrease the swelling.  Elevation, as much as possible for the next 48 hours, is critical for decreasing swelling as well as for pain relief. Elevation means when you are seated or lying down, you hand should be at or above your heart. When walking, the hand needs to be at or above the level of your elbow.  If the bandage gets too tight, it may need to be loosened. Please contact our office and we will instruct you in how to do this.    SURGICAL BANDAGES:  Keep your dressing and/or splint clean and dry at all times.  Do not remove until you are seen again in the office.  If careful, you may place a plastic bag over your bandage and tape the end to shower, but be careful, do not get your bandages wet.     HAND THERAPY:  You will be contacted to set up your first therapy visit.  It will likely be scheduled for around the time of your first follow up appointment.    ACTIVITY AND WORK: You are encouraged to move any fingers which are not in the bandage.  Light use of the fingers is allowed to assist the other hand with daily hygiene and eating, but strong  gripping or lifting is often uncomfortable and should be avoided.  You might miss a variable period of time from work and hopefully this issue has been discussed prior to surgery. You may not do any heavy work with your affected hand for about 2 weeks.    [facility] Second Floor, [address] [place], [place] [address] [phone]

## 2024-01-02 NOTE — Anesthesia Preprocedure Evaluation (Signed)
 Anesthesia Evaluation  Patient identified by MRN, date of birth, ID band Patient awake    Reviewed: Allergy & Precautions, H&P , NPO status , Patient's Chart, lab work & pertinent test results  History of Anesthesia Complications Negative for: history of anesthetic complications  Airway Mallampati: II  TM Distance: >3 FB Neck ROM: Full    Dental no notable dental hx.    Pulmonary neg pulmonary ROS   Pulmonary exam normal breath sounds clear to auscultation       Cardiovascular hypertension, (-) angina + CAD  (-) Past MI Normal cardiovascular exam+ dysrhythmias Supra Ventricular Tachycardia + Valvular Problems/Murmurs AS  Rhythm:Regular Rate:Normal  2024: IMPRESSIONS     1. Left ventricular ejection fraction by 3D volume is 57 %. The left  ventricle has normal function. The left ventricle has no regional wall  motion abnormalities. There is mild left ventricular hypertrophy. Left  ventricular diastolic parameters are  consistent with Grade I diastolic dysfunction (impaired relaxation).   2. Right ventricular systolic function is normal. The right ventricular  size is normal. There is normal pulmonary artery systolic pressure. The  estimated right ventricular systolic pressure is 23.4 mmHg.   3. The mitral valve is grossly normal. Mild mitral valve regurgitation.  No evidence of mitral stenosis.   4. The aortic valve is tricuspid. There is mild calcification of the  aortic valve. There is moderate thickening of the aortic valve. Aortic  valve regurgitation is not visualized. Mild aortic valve stenosis. Aortic  valve area, by VTI measures 2.11 cm.  Aortic valve mean gradient measures 10.0 mmHg. Aortic valve Vmax measures  2.16 m/s.   5. The inferior vena cava is normal in size with greater than 50%  respiratory variability, suggesting right atrial pressure of 3 mmHg.     Neuro/Psych neg Seizures negative neurological ROS   negative psych ROS   GI/Hepatic negative GI ROS, Neg liver ROS,,,  Endo/Other  negative endocrine ROS    Renal/GU negative Renal ROS  negative genitourinary   Musculoskeletal  (+) Arthritis ,    Abdominal   Peds negative pediatric ROS (+)  Hematology negative hematology ROS (+)   Anesthesia Other Findings   Reproductive/Obstetrics negative OB ROS                              Anesthesia Physical Anesthesia Plan  ASA: 3  Anesthesia Plan: General   Post-op Pain Management: Tylenol  PO (pre-op)*   Induction: Intravenous  PONV Risk Score and Plan: 2 and Ondansetron  and Dexamethasone   Airway Management Planned: LMA  Additional Equipment: None  Intra-op Plan:   Post-operative Plan: Extubation in OR  Informed Consent: I have reviewed the patients History and Physical, chart, labs and discussed the procedure including the risks, benefits and alternatives for the proposed anesthesia with the patient or authorized representative who has indicated his/her understanding and acceptance.     Dental advisory given  Plan Discussed with: CRNA  Anesthesia Plan Comments:          Anesthesia Quick Evaluation

## 2024-01-02 NOTE — Anesthesia Postprocedure Evaluation (Signed)
 Anesthesia Post Note  Patient: Troy [name]  Procedure(s) Performed: IRRIGATION AND DEBRIDEMENT WOUND (Left)     Patient location during evaluation: PACU Anesthesia Type: General Level of consciousness: awake and alert Pain management: pain level controlled Vital Signs Assessment: post-procedure vital signs reviewed and stable Respiratory status: spontaneous breathing, nonlabored ventilation, respiratory function stable and patient connected to nasal cannula oxygen Cardiovascular status: blood pressure returned to baseline and stable Postop Assessment: no apparent nausea or vomiting Anesthetic complications: no   No notable events documented.  Last Vitals:  Vitals:   01/02/24 1345 01/02/24 1400  BP: (!) 140/84 (!) 140/85  Pulse: 83 80  Resp: 13 10  Temp:  36.5 C  SpO2: 93% 92%    Last Pain:  Vitals:   01/02/24 1325  TempSrc:   PainSc: 0-No pain                 Thom JONELLE Peoples

## 2024-01-02 NOTE — Interval H&P Note (Signed)
 History and Physical Interval Note:  01/02/2024 12:01 PM  Troy [name]  has presented today for surgery, with the diagnosis of OPEN LEFT MIDDLE FINGER DISTAL PHALANX FRACTURE.  The various methods of treatment have been discussed with the patient and family. After consideration of risks, benefits and other options for treatment, the patient has consented to  Procedure(s) with comments: IRRIGATION AND DEBRIDEMENT WOUND (Left) - I AND D, MIDDLE FINGER DISTAL PHALANX FRACTURE as a surgical intervention.  The patient's history has been reviewed, patient examined, no change in status, stable for surgery.  I have reviewed the patient's chart and labs.  Questions were answered to the patient's satisfaction.     Kameisha Malicki

## 2024-01-04 ENCOUNTER — Encounter (HOSPITAL_COMMUNITY): Payer: Self-pay | Admitting: Orthopedic Surgery

## 2024-01-18 DIAGNOSIS — S62633B Displaced fracture of distal phalanx of left middle finger, initial encounter for open fracture: Secondary | ICD-10-CM | POA: Diagnosis not present

## 2024-01-18 DIAGNOSIS — S61319A Laceration without foreign body of unspecified finger with damage to nail, initial encounter: Secondary | ICD-10-CM | POA: Diagnosis not present

## 2024-01-24 DIAGNOSIS — Z23 Encounter for immunization: Secondary | ICD-10-CM | POA: Diagnosis not present

## 2024-02-08 DIAGNOSIS — S62633B Displaced fracture of distal phalanx of left middle finger, initial encounter for open fracture: Secondary | ICD-10-CM | POA: Diagnosis not present

## 2024-02-15 ENCOUNTER — Other Ambulatory Visit: Payer: Self-pay | Admitting: Internal Medicine

## 2024-02-29 DIAGNOSIS — Z125 Encounter for screening for malignant neoplasm of prostate: Secondary | ICD-10-CM | POA: Diagnosis not present

## 2024-02-29 DIAGNOSIS — R3912 Poor urinary stream: Secondary | ICD-10-CM | POA: Diagnosis not present

## 2024-03-14 DIAGNOSIS — S62633B Displaced fracture of distal phalanx of left middle finger, initial encounter for open fracture: Secondary | ICD-10-CM | POA: Diagnosis not present

## 2024-03-14 DIAGNOSIS — S61319A Laceration without foreign body of unspecified finger with damage to nail, initial encounter: Secondary | ICD-10-CM | POA: Diagnosis not present

## 2024-03-28 ENCOUNTER — Encounter: Payer: Self-pay | Admitting: Internal Medicine

## 2024-03-28 ENCOUNTER — Ambulatory Visit (INDEPENDENT_AMBULATORY_CARE_PROVIDER_SITE_OTHER): Admitting: Internal Medicine

## 2024-03-28 VITALS — BP 128/82 | HR 78 | Temp 98.9°F | Ht 74.0 in | Wt 221.0 lb

## 2024-03-28 DIAGNOSIS — I10 Essential (primary) hypertension: Secondary | ICD-10-CM

## 2024-03-28 DIAGNOSIS — L980 Pyogenic granuloma: Secondary | ICD-10-CM | POA: Diagnosis not present

## 2024-03-28 DIAGNOSIS — S62633B Displaced fracture of distal phalanx of left middle finger, initial encounter for open fracture: Secondary | ICD-10-CM | POA: Diagnosis not present

## 2024-03-28 DIAGNOSIS — J019 Acute sinusitis, unspecified: Secondary | ICD-10-CM

## 2024-03-28 MED ORDER — AMOXICILLIN-POT CLAVULANATE 875-125 MG PO TABS: 1.0000 | ORAL_TABLET | Freq: Two times a day (BID) | ORAL | 0 refills | Status: AC

## 2024-03-28 NOTE — Patient Instructions (Addendum)
       Medications changes include :   Augmentin  for 7 days      Return if symptoms worsen or fail to improve.

## 2024-03-28 NOTE — Progress Notes (Signed)
 Subjective:    Patient ID: Troy [name], male    DOB: Feb 03, 1946, 78 y.o.   MRN: [id number]      HPI Troy [name] is here for  Chief Complaint  Patient presents with   Acute Visit    Coughing phlegm, sinus congetion, and diarrhea and a hacking cough for 7 days the last 2 days have been the worst     Discussed the use of AI scribe software for clinical note transcription with the patient, who gave verbal consent to proceed.  History of Present Illness Troy [name] is a 78 year old male who presents with respiratory symptoms and sinus congestion.  He has been experiencing respiratory symptoms for several days, with the past two days being the most severe. Symptoms include a productive cough with phlegm, chest congestion, and difficulty sleeping. Shortness of breath is noted, especially when coughing, and he feels like he might 'break a rib due to the force of the cough. No fevers or chills are present.  He confirms nasal congestion, sore throat due to nasal drip, sinus pain, and pressure. Dizziness occurs, particularly when climbing stairs, but resolves quickly after sitting down. No ear pain or pressure is reported, though some dizziness and body aches are present.  He has been taking ibuprofen for inflammation and achiness, and uses Sudafed cautiously due to its effect on his blood pressure. Saline spray is used for nasal congestion, and chest rub provides some relief.  He recently completed a course of antibiotics (Bactrim) for a finger infection after a dog bite, which he suspects may have upset his stomach. The antibiotic course ended on Sunday, and his finger is healing well with no signs of infection.  He believes he contracted the illness from his family, starting with his 74-month-old grandchild, and notes that everyone in the family has had a version of the illness.     Medications and allergies reviewed with patient and updated if appropriate.  Current  Outpatient Medications on File Prior to Visit  Medication Sig Dispense Refill   acetaminophen  (TYLENOL ) 500 MG tablet Take 500 mg by mouth daily.     amLODipine  (NORVASC ) 5 MG tablet Take 1 tablet (5 mg total) by mouth daily. 180 tablet 3   amoxicillin -clavulanate (AUGMENTIN ) 875-125 MG tablet Take 1 tablet by mouth every 12 (twelve) hours. (Patient not taking: Reported on 01/02/2024) 20 tablet 0   aspirin 81 MG EC tablet Take 81 mg by mouth daily. (Patient not taking: Reported on 01/02/2024)     carvedilol  (COREG ) 6.25 MG tablet Take 1 tablet (6.25 mg total) by mouth 2 (two) times daily. 180 tablet 3   Cholecalciferol (VITAMIN D -3 PO) Take 1 capsule by mouth daily.     ezetimibe  (ZETIA ) 10 MG tablet Take 1 tablet (10 mg total) by mouth daily. (Patient taking differently: Take 10 mg by mouth at bedtime.) 90 tablet 3   Fluocinolone Acetonide 0.01 % OIL Place 1 application  into both ears at bedtime as needed (ear itching, eczema). OTIC oil     fluticasone  (FLONASE ) 50 MCG/ACT nasal spray Place 1-2 sprays into both nostrils daily as needed for allergies.     irbesartan -hydrochlorothiazide  (AVALIDE) 300-12.5 MG tablet Take 1 tablet by mouth daily. 90 tablet 3   Multiple Vitamins-Minerals (PRESERVISION AREDS 2) CAPS Take 1 capsule by mouth 2 (two) times daily.     mupirocin  ointment (BACTROBAN ) 2 % Apply 1 Application topically 2 (two) times daily. (Patient taking differently: Apply 1 Application  topically 2 (two) times daily as needed (wound care).) 22 g 1   neomycin-bacitracin -polymyxin (NEOSPORIN) [phone] ointment Apply 1 Application topically 4 (four) times daily as needed (wound care).     rosuvastatin  (CRESTOR ) 10 MG tablet Take 1 tablet (10 mg total) by mouth daily. (Patient taking differently: Take 5 mg by mouth See admin instructions. Take 1/2 tablet (5mg ) by mouth every other night.) 90 tablet 3   traMADol  (ULTRAM ) 50 MG tablet TAKE 1 TABLET BY MOUTH EVERY 6 HOURS AS NEEDED 60 tablet 2   No  current facility-administered medications on file prior to visit.    Review of Systems  Constitutional:  Negative for chills and fever.  HENT:  Positive for congestion, postnasal drip, sinus pressure and sore throat (from PND). Negative for ear pain.   Respiratory:  Positive for cough (productive), shortness of breath and wheezing.   Gastrointestinal:  Positive for diarrhea.  Neurological:  Positive for dizziness. Negative for headaches.       Objective:   Vitals:   03/28/24 1452  BP: 128/82  Pulse: 78  Temp: 98.9 F (37.2 C)  SpO2: 98%   BP Readings from Last 3 Encounters:  03/28/24 128/82  01/02/24 (!) 140/85  01/02/24 (!) 162/96   Wt Readings from Last 3 Encounters:  03/28/24 221 lb (100.2 kg)  01/02/24 218 lb (98.9 kg)  01/01/24 217 lb 2.5 oz (98.5 kg)   Body mass index is 28.37 kg/m.    Physical Exam Constitutional:      General: He is not in acute distress.    Appearance: Normal appearance. He is not ill-appearing.  HENT:     Head: Normocephalic.     Right Ear: Tympanic membrane, ear canal and external ear normal. There is no impacted cerumen.     Left Ear: Tympanic membrane, ear canal and external ear normal. There is no impacted cerumen.     Nose: Congestion present.     Mouth/Throat:     Mouth: Mucous membranes are moist.     Pharynx: No oropharyngeal exudate or posterior oropharyngeal erythema.  Eyes:     Conjunctiva/sclera: Conjunctivae normal.  Cardiovascular:     Rate and Rhythm: Normal rate and regular rhythm.  Pulmonary:     Effort: Pulmonary effort is normal. No respiratory distress.     Breath sounds: Normal breath sounds. No wheezing or rales.  Musculoskeletal:     Cervical back: Neck supple. No tenderness.  Lymphadenopathy:     Cervical: No cervical adenopathy.  Skin:    General: Skin is warm and dry.     Findings: No rash.  Neurological:     Mental Status: He is alert.            Assessment & Plan:    See Problem List for  Assessment and Plan of chronic medical problems.   Assessment and Plan Assessment & Plan Acute sinusitis and upper respiratory infection with cough and congestion Symptoms consistent with sinusitis. Discussed difficulty in differentiating viral from bacterial infections and potential resistance of Bactrim. Augmentin  chosen as first-line treatment. - Prescribed Augmentin .875-125 mg bid x 7 days - Continue saline nasal spray. - Encourage rest and hydration. - Advised use of chest rub for symptomatic relief. - Sent prescription to pharmacy.  Hypertension Chronic and well controlled here today - no change in medications - continue amlodipine  5 mg daily, coreg  6.25 mg bid, irbesartan -hydrochlorothiazide  300-12.5 mg daily

## 2024-05-24 ENCOUNTER — Other Ambulatory Visit: Payer: Self-pay | Admitting: Internal Medicine
